# Patient Record
Sex: Male | Born: 1935 | Race: White | Hispanic: No | Marital: Married | Smoking: Never smoker
Health system: Southern US, Community
[De-identification: ages and names within clinical notes are randomized; demographics above are authoritative.]

## PROBLEM LIST (undated history)

## (undated) DIAGNOSIS — C679 Malignant neoplasm of bladder, unspecified: Secondary | ICD-10-CM

## (undated) DIAGNOSIS — D500 Iron deficiency anemia secondary to blood loss (chronic): Secondary | ICD-10-CM

## (undated) DIAGNOSIS — K219 Gastro-esophageal reflux disease without esophagitis: Secondary | ICD-10-CM

## (undated) DIAGNOSIS — M1990 Unspecified osteoarthritis, unspecified site: Secondary | ICD-10-CM

## (undated) DIAGNOSIS — I10 Essential (primary) hypertension: Secondary | ICD-10-CM

## (undated) DIAGNOSIS — I4891 Unspecified atrial fibrillation: Secondary | ICD-10-CM

## (undated) DIAGNOSIS — I2510 Atherosclerotic heart disease of native coronary artery without angina pectoris: Secondary | ICD-10-CM

## (undated) DIAGNOSIS — I219 Acute myocardial infarction, unspecified: Secondary | ICD-10-CM

## (undated) DIAGNOSIS — Z87442 Personal history of urinary calculi: Secondary | ICD-10-CM

## (undated) DIAGNOSIS — E785 Hyperlipidemia, unspecified: Secondary | ICD-10-CM

## (undated) DIAGNOSIS — C61 Malignant neoplasm of prostate: Secondary | ICD-10-CM

## (undated) DIAGNOSIS — R972 Elevated prostate specific antigen [PSA]: Secondary | ICD-10-CM

## (undated) DIAGNOSIS — D369 Benign neoplasm, unspecified site: Secondary | ICD-10-CM

## (undated) DIAGNOSIS — K5790 Diverticulosis of intestine, part unspecified, without perforation or abscess without bleeding: Secondary | ICD-10-CM

## (undated) DIAGNOSIS — R350 Frequency of micturition: Secondary | ICD-10-CM

## (undated) HISTORY — PX: CYSTOSCOPY W/ STONE MANIPULATION: SHX1427

## (undated) HISTORY — DX: Essential (primary) hypertension: I10

## (undated) HISTORY — PX: CARDIAC CATHETERIZATION: SHX172

## (undated) HISTORY — DX: Atherosclerotic heart disease of native coronary artery without angina pectoris: I25.10

## (undated) HISTORY — DX: Diverticulosis of intestine, part unspecified, without perforation or abscess without bleeding: K57.90

## (undated) HISTORY — DX: Hyperlipidemia, unspecified: E78.5

## (undated) HISTORY — DX: Benign neoplasm, unspecified site: D36.9

## (undated) HISTORY — DX: Elevated prostate specific antigen (PSA): R97.20

## (undated) HISTORY — DX: Iron deficiency anemia secondary to blood loss (chronic): D50.0

---

## 1989-02-13 DIAGNOSIS — I219 Acute myocardial infarction, unspecified: Secondary | ICD-10-CM

## 1989-02-13 HISTORY — DX: Acute myocardial infarction, unspecified: I21.9

## 1991-08-14 HISTORY — PX: CORONARY ARTERY BYPASS GRAFT: SHX141

## 1997-10-08 ENCOUNTER — Ambulatory Visit (HOSPITAL_COMMUNITY): Admission: RE | Admit: 1997-10-08 | Discharge: 1997-10-08 | Payer: Self-pay | Admitting: Urology

## 2001-11-14 ENCOUNTER — Encounter (INDEPENDENT_AMBULATORY_CARE_PROVIDER_SITE_OTHER): Payer: Self-pay

## 2001-11-14 ENCOUNTER — Ambulatory Visit (HOSPITAL_COMMUNITY): Admission: RE | Admit: 2001-11-14 | Discharge: 2001-11-14 | Payer: Self-pay | Admitting: *Deleted

## 2004-01-14 ENCOUNTER — Encounter: Payer: Self-pay | Admitting: Cardiology

## 2004-03-25 ENCOUNTER — Encounter (INDEPENDENT_AMBULATORY_CARE_PROVIDER_SITE_OTHER): Payer: Self-pay | Admitting: *Deleted

## 2004-03-25 ENCOUNTER — Ambulatory Visit (HOSPITAL_COMMUNITY): Admission: RE | Admit: 2004-03-25 | Discharge: 2004-03-25 | Payer: Self-pay | Admitting: *Deleted

## 2007-04-16 HISTORY — PX: ORIF ACETABULAR FRACTURE: SHX5029

## 2007-04-29 ENCOUNTER — Encounter: Payer: Self-pay | Admitting: Cardiology

## 2007-05-02 ENCOUNTER — Ambulatory Visit (HOSPITAL_COMMUNITY): Admission: RE | Admit: 2007-05-02 | Discharge: 2007-05-02 | Payer: Self-pay | Admitting: Orthopedic Surgery

## 2007-05-04 ENCOUNTER — Inpatient Hospital Stay (HOSPITAL_COMMUNITY): Admission: RE | Admit: 2007-05-04 | Discharge: 2007-05-06 | Payer: Self-pay | Admitting: Orthopedic Surgery

## 2007-07-26 ENCOUNTER — Ambulatory Visit (HOSPITAL_BASED_OUTPATIENT_CLINIC_OR_DEPARTMENT_OTHER): Admission: RE | Admit: 2007-07-26 | Discharge: 2007-07-26 | Payer: Self-pay | Admitting: Orthopedic Surgery

## 2007-08-11 ENCOUNTER — Encounter: Payer: Self-pay | Admitting: Cardiology

## 2008-02-10 ENCOUNTER — Encounter: Payer: Self-pay | Admitting: Cardiology

## 2008-02-10 HISTORY — PX: CARDIOVASCULAR STRESS TEST: SHX262

## 2008-08-09 ENCOUNTER — Encounter: Payer: Self-pay | Admitting: Cardiology

## 2009-08-25 ENCOUNTER — Encounter: Payer: Self-pay | Admitting: Cardiology

## 2009-12-02 ENCOUNTER — Encounter: Payer: Self-pay | Admitting: Cardiology

## 2010-02-25 ENCOUNTER — Ambulatory Visit: Payer: Self-pay | Admitting: Cardiology

## 2010-02-25 DIAGNOSIS — I2510 Atherosclerotic heart disease of native coronary artery without angina pectoris: Secondary | ICD-10-CM | POA: Insufficient documentation

## 2010-02-25 DIAGNOSIS — I25810 Atherosclerosis of coronary artery bypass graft(s) without angina pectoris: Secondary | ICD-10-CM | POA: Insufficient documentation

## 2010-02-25 DIAGNOSIS — I10 Essential (primary) hypertension: Secondary | ICD-10-CM | POA: Insufficient documentation

## 2010-02-25 DIAGNOSIS — E785 Hyperlipidemia, unspecified: Secondary | ICD-10-CM | POA: Insufficient documentation

## 2010-02-25 DIAGNOSIS — E7800 Pure hypercholesterolemia, unspecified: Secondary | ICD-10-CM | POA: Insufficient documentation

## 2010-07-17 NOTE — Letter (Signed)
Summary: [facility] Extended Visit Note   [facility] Extended Visit Note   Imported By: Roderic Ovens 04/18/2010 14:33:22  _____________________________________________________________________  External Attachment:    Type:   Image     Comment:   External Document

## 2010-07-17 NOTE — Progress Notes (Signed)
Summary: [facility] Office Note   [facility]   Imported By: Roderic Ovens 04/18/2010 14:33:50  _____________________________________________________________________  External Attachment:    Type:   Image     Comment:   External Document

## 2010-07-17 NOTE — Assessment & Plan Note (Signed)
Summary: np6/dx:CAD   Visit Type:  Initial Consult Primary Provider:  Renne Crigler  CC:  CAD.  History of Present Illness: Patient is referred through the courtesy of Dr. Merri Brunette for continuing cardiac care.  The patient had an MI and CABG in 11-08-91.  He has been followed for hypertension, and is a [place] [facility]  He has a large farm, and retired in the past couple of years.  He has no chest pain, shortness of breath, or change in symptoms to warrant concern at the present time.   Current Medications (verified): 1)  Prilosec Otc 20 Mg Tbec (Omeprazole Magnesium) .... Take 1 Tablet By Mouth Once A Day 2)  Saw Palmetto Plus  Caps (Misc Natural Products) .... 4 Daily 3)  Crestor 20 Mg Tabs (Rosuvastatin Calcium) .... Take 1/2  Tablet By Mouth Daily. 4)  Toprol Xl 50 Mg Xr24h-Tab (Metoprolol Succinate) .... Take 1 Tablet By Mouth Once A Day 5)  Fiber Therapy 500 Mg Tabs (Methylcellulose (Laxative)) .... 625mg  2 in Am and 2 Pm 6)  Excedrin Pm 500-38 Mg Tabs (Diphenhydramine-Apap (Sleep)) .... As Needed  Allergies (verified): No Known Drug Allergies  Past History:  Past Medical History: CABG 1991-11-08 details unknown at present. HTN under control Chronic tinnitus with decreased hearing Elevated PSA--follows with Dr. Annabell Howells Mixed hyperlipidemia on Crestor Kidney stones times three Diverticulosis Gout Adenomatous polyp Stress test Tysinger 02/10/08---6 mets, poor tolerance, no chest pain,  Cath 2005--total occlusion of LAD, LCX, RCA.  LIMA patent, normal seq SVG to OM, severe stenosis in SVG to PDA, but collaterals to PLA from OM.  RCA went to PDA.  Treated medically  Past Surgical History: Prior CABG ORIF trimalleolar fracture 2006-11-08  Family History: F died of MI age 61 Mother died of MI age 67 Sons alive and well Brother 4 with MI. Three sisters and two other brothers without cardiac  Social History: Lives in [place].  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   Walks. Does wear hearing aids.   Review of Systems       The patient complains of decreased hearing.  The patient denies anorexia, fever, weight loss, weight gain, vision loss, hoarseness, chest pain, syncope, dyspnea on exertion, peripheral edema, prolonged cough, headaches, hemoptysis, abdominal pain, melena, hematochezia, severe indigestion/heartburn, hematuria, incontinence, and muscle weakness.    Vital Signs:  Patient profile:   77 year old male Height:      72 inches Weight:      232.75 pounds BMI:     31.68 Pulse rate:   62 / minute Pulse rhythm:   regular Resp:     18 per minute BP sitting:   132 / 75  (left arm) Cuff size:   large  Vitals Entered By: Vikki Ports (February 25, 2010 12:54 PM)  Physical Exam  General:  Well developed, well nourished, in no acute distress. Head:  normocephalic and atraumatic Eyes:  PERRLA/EOM intact; conjunctiva and lids normal. Lungs:  Clear bilaterally to auscultation and percussion. Heart:  PMI non displaced.  Normal S1 and S2.  1-2/6 SEM.  No DM, rub, or gallop. Abdomen:  Bowel sounds positive; abdomen soft and non-tender without masses, organomegaly, or hernias noted. No hepatosplenomegaly. Pulses:  pulses normal in all 4 extremities Extremities:  No clubbing or cyanosis. Neurologic:  Alert and oriented x 3.   EKG  Procedure date:  02/25/2010  Findings:      NSR with RBBB.  PACs.  No acute changes.  Cardiac Cath  Procedure date:  01/14/2004  Findings:      Report generated from [facility] above.  Should be filed in chart.  Impression & Recommendations:  Problem # 1:  CAD, ARTERY BYPASS GRAFT (ICD-414.04) Patient is currently asymptomatic.  He does what he wants, and denies angina.  ETT does describe poor exercise tolerance.  Cath report in 2005 revealed evidence of SVG disease, but treated medically.  As such, no change in status.  Will repeat GXT at next follow up visit. His updated medication list  for this problem includes:    Toprol Xl 50 Mg Xr24h-tab (Metoprolol succinate) .Marland Kitchen... Take 1 tablet by mouth once a day  Problem # 2:  HYPERTENSION, BENIGN (ICD-401.1) controlled at present.  Followed by Dr. Renne Crigler. His updated medication list for this problem includes:    Toprol Xl 50 Mg Xr24h-tab (Metoprolol succinate) .Marland Kitchen... Take 1 tablet by mouth once a day  Problem # 3:  HYPERLIPIDEMIA-MIXED (ICD-272.4) followed and managed by Dr. Renne Crigler His updated medication list for this problem includes:    Crestor 20 Mg Tabs (Rosuvastatin calcium) .Marland Kitchen... Take 1/2  tablet by mouth daily.  Other Orders: EKG w/ Interpretation (93000)  Patient Instructions: 1)  Your physician wants you to follow-up in: 6 MONTHS.  You will receive a reminder letter in the mail two months in advance. If you don't receive a letter, please call our office to schedule the follow-up appointment. 2)  Your physician recommends that you continue on your current medications as directed. Please refer to the Current Medication list given to you today. 3)  Your physician has requested that you have an exercise tolerance test in 6 MONTHS.  For further information please visit [email or web address]  Please also follow instruction sheet, as given.

## 2010-07-17 NOTE — Letter (Signed)
Summary: [facility] Cath Report   [facility] Cath Report   Imported By: Roderic Ovens 04/21/2010 15:30:08  _____________________________________________________________________  External Attachment:    Type:   Image     Comment:   External Document

## 2010-07-17 NOTE — Progress Notes (Signed)
Summary: [facility] Office Note   [facility] Office Note   Imported By: Roderic Ovens 04/18/2010 14:35:14  _____________________________________________________________________  External Attachment:    Type:   Image     Comment:   External Document

## 2010-07-17 NOTE — Letter (Signed)
Summary: [facility]  [facility]   Imported By: Marylou Mccoy 04/18/2010 15:38:18  _____________________________________________________________________  External Attachment:    Type:   Image     Comment:   External Document

## 2010-07-17 NOTE — Progress Notes (Signed)
Summary: [facility] Office Note   [facility] Office Note   Imported By: Roderic Ovens 04/18/2010 14:34:23  _____________________________________________________________________  External Attachment:    Type:   Image     Comment:   External Document

## 2010-07-17 NOTE — Letter (Signed)
Summary: [facility]  [facility]   Imported By: Marylou Mccoy 04/18/2010 15:36:14  _____________________________________________________________________  External Attachment:    Type:   Image     Comment:   External Document

## 2010-08-21 ENCOUNTER — Encounter (INDEPENDENT_AMBULATORY_CARE_PROVIDER_SITE_OTHER): Payer: Medicare Other

## 2010-08-21 ENCOUNTER — Inpatient Hospital Stay (HOSPITAL_COMMUNITY)
Admission: EM | Admit: 2010-08-21 | Discharge: 2010-08-25 | DRG: 310 | Disposition: A | Discharge status: Home / Self Care | Payer: Medicare Other | Attending: Cardiology | Admitting: Cardiology

## 2010-08-21 ENCOUNTER — Emergency Department (HOSPITAL_COMMUNITY): Payer: Medicare Other

## 2010-08-21 ENCOUNTER — Encounter (INDEPENDENT_AMBULATORY_CARE_PROVIDER_SITE_OTHER): Payer: Medicare Other | Admitting: Cardiology

## 2010-08-21 ENCOUNTER — Encounter: Payer: Self-pay | Admitting: Cardiology

## 2010-08-21 DIAGNOSIS — E785 Hyperlipidemia, unspecified: Secondary | ICD-10-CM | POA: Diagnosis present

## 2010-08-21 DIAGNOSIS — R0989 Other specified symptoms and signs involving the circulatory and respiratory systems: Secondary | ICD-10-CM

## 2010-08-21 DIAGNOSIS — R972 Elevated prostate specific antigen [PSA]: Secondary | ICD-10-CM | POA: Diagnosis present

## 2010-08-21 DIAGNOSIS — I10 Essential (primary) hypertension: Secondary | ICD-10-CM | POA: Diagnosis present

## 2010-08-21 DIAGNOSIS — I4891 Unspecified atrial fibrillation: Principal | ICD-10-CM | POA: Diagnosis present

## 2010-08-21 DIAGNOSIS — I4510 Unspecified right bundle-branch block: Secondary | ICD-10-CM | POA: Diagnosis present

## 2010-08-21 DIAGNOSIS — I2510 Atherosclerotic heart disease of native coronary artery without angina pectoris: Secondary | ICD-10-CM

## 2010-08-21 DIAGNOSIS — R9430 Abnormal result of cardiovascular function study, unspecified: Secondary | ICD-10-CM

## 2010-08-21 DIAGNOSIS — I2582 Chronic total occlusion of coronary artery: Secondary | ICD-10-CM | POA: Diagnosis present

## 2010-08-21 DIAGNOSIS — I252 Old myocardial infarction: Secondary | ICD-10-CM

## 2010-08-21 DIAGNOSIS — R000 Tachycardia, unspecified: Secondary | ICD-10-CM

## 2010-08-21 DIAGNOSIS — I4892 Unspecified atrial flutter: Secondary | ICD-10-CM | POA: Diagnosis present

## 2010-08-21 DIAGNOSIS — Z79899 Other long term (current) drug therapy: Secondary | ICD-10-CM

## 2010-08-21 LAB — POCT CARDIAC MARKERS: Myoglobin, poc: 132 ng/mL (ref 12–200)

## 2010-08-21 LAB — POCT I-STAT, CHEM 8
Calcium, Ion: 1.12 mmol/L (ref 1.12–1.32)
Chloride: 108 mEq/L (ref 96–112)
Creatinine, Ser: 1.3 mg/dL (ref 0.4–1.5)
Glucose, Bld: 105 mg/dL — ABNORMAL HIGH (ref 70–99)
Potassium: 3.8 mEq/L (ref 3.5–5.1)

## 2010-08-21 LAB — BASIC METABOLIC PANEL
CO2: 22 mEq/L (ref 19–32)
Calcium: 9.4 mg/dL (ref 8.4–10.5)
Creatinine, Ser: 1.19 mg/dL (ref 0.4–1.5)
GFR calc Af Amer: >60 mL/min (ref >60)

## 2010-08-21 LAB — CBC
Hemoglobin: 16.2 g/dL (ref 13.0–17.0)
MCH: 30.8 pg (ref 26.0–34.0)
MCV: 90.5 fL (ref 78.0–100.0)
RBC: 5.26 MIL/uL (ref 4.22–5.81)
RDW: 12.9 % (ref 11.5–15.5)

## 2010-08-21 LAB — DIFFERENTIAL
Basophils Absolute: 0 10*3/uL (ref 0.0–0.1)
Eosinophils Absolute: 0.1 10*3/uL (ref 0.0–0.7)
Monocytes Relative: 11 % (ref 3–12)
Neutro Abs: 5.9 10*3/uL (ref 1.7–7.7)

## 2010-08-21 LAB — URINALYSIS, ROUTINE W REFLEX MICROSCOPIC
Hgb urine dipstick: NEGATIVE
Ketones, ur: 15 mg/dL — AB
Specific Gravity, Urine: 1.026 (ref 1.005–1.030)

## 2010-08-21 LAB — CARDIAC PANEL(CRET KIN+CKTOT+MB+TROPI)
Relative Index: INVALID (ref 0.0–2.5)
Troponin I: 0.13 ng/mL — ABNORMAL HIGH (ref 0.00–0.06)

## 2010-08-21 LAB — PHOSPHORUS: Phosphorus: 2.7 mg/dL (ref 2.3–4.6)

## 2010-08-21 LAB — HEPATIC FUNCTION PANEL
Alkaline Phosphatase: 54 U/L (ref 39–117)
Bilirubin, Direct: 0.2 mg/dL (ref 0.0–0.3)
Total Bilirubin: 0.9 mg/dL (ref 0.3–1.2)
Total Protein: 7.1 g/dL (ref 6.0–8.3)

## 2010-08-22 ENCOUNTER — Inpatient Hospital Stay (HOSPITAL_COMMUNITY): Payer: Medicare Other

## 2010-08-22 DIAGNOSIS — R000 Tachycardia, unspecified: Secondary | ICD-10-CM

## 2010-08-22 LAB — CBC
Hemoglobin: 15.6 g/dL (ref 13.0–17.0)
Platelets: 243 10*3/uL (ref 150–400)
RBC: 5.07 MIL/uL (ref 4.22–5.81)
RDW: 13.1 % (ref 11.5–15.5)
WBC: 9.7 10*3/uL (ref 4.0–10.5)

## 2010-08-22 LAB — TSH: TSH: 4.828 u[IU]/mL — ABNORMAL HIGH (ref 0.350–4.500)

## 2010-08-22 LAB — HEPARIN LEVEL (UNFRACTIONATED): Heparin Unfractionated: 0.83 IU/mL — ABNORMAL HIGH (ref 0.30–0.70)

## 2010-08-23 DIAGNOSIS — I4891 Unspecified atrial fibrillation: Secondary | ICD-10-CM

## 2010-08-23 LAB — CBC
HCT: 43.3 % (ref 39.0–52.0)
Hemoglobin: 14.2 g/dL (ref 13.0–17.0)
Hemoglobin: 14.8 g/dL (ref 13.0–17.0)
MCH: 30 pg (ref 26.0–34.0)
RBC: 4.74 MIL/uL (ref 4.22–5.81)
RBC: 4.77 MIL/uL (ref 4.22–5.81)
WBC: 8.3 10*3/uL (ref 4.0–10.5)
WBC: 8.4 10*3/uL (ref 4.0–10.5)

## 2010-08-23 LAB — PROTIME-INR
INR: 1.26 (ref 0.00–1.49)
Prothrombin Time: 16 seconds — ABNORMAL HIGH (ref 11.6–15.2)

## 2010-08-23 LAB — HEPARIN LEVEL (UNFRACTIONATED): Heparin Unfractionated: 0.33 IU/mL (ref 0.30–0.70)

## 2010-08-24 DIAGNOSIS — I10 Essential (primary) hypertension: Secondary | ICD-10-CM

## 2010-08-24 LAB — CBC
Hemoglobin: 12.1 g/dL — ABNORMAL LOW (ref 13.0–17.0)
Platelets: 125 10*3/uL — ABNORMAL LOW (ref 150–400)
RBC: 4.51 MIL/uL (ref 4.22–5.81)
WBC: 9 10*3/uL (ref 4.0–10.5)

## 2010-08-25 LAB — BASIC METABOLIC PANEL
CO2: 27 mEq/L (ref 19–32)
Calcium: 8.8 mg/dL (ref 8.4–10.5)
Chloride: 108 mEq/L (ref 96–112)
GFR calc Af Amer: >60 mL/min (ref >60)
Sodium: 141 mEq/L (ref 135–145)

## 2010-08-25 LAB — CBC
HCT: 41.8 % (ref 39.0–52.0)
MCV: 91.3 fL (ref 78.0–100.0)
RBC: 4.58 MIL/uL (ref 4.22–5.81)
WBC: 8.8 10*3/uL (ref 4.0–10.5)

## 2010-08-25 LAB — PROTIME-INR: INR: 1.74 — ABNORMAL HIGH (ref 0.00–1.49)

## 2010-08-25 LAB — MAGNESIUM: Magnesium: 1.8 mg/dL (ref 1.5–2.5)

## 2010-08-29 ENCOUNTER — Encounter (INDEPENDENT_AMBULATORY_CARE_PROVIDER_SITE_OTHER): Payer: Medicare Other | Admitting: Cardiology

## 2010-08-29 ENCOUNTER — Encounter: Payer: Self-pay | Admitting: Cardiology

## 2010-08-29 DIAGNOSIS — I2510 Atherosclerotic heart disease of native coronary artery without angina pectoris: Secondary | ICD-10-CM

## 2010-08-29 DIAGNOSIS — I4891 Unspecified atrial fibrillation: Secondary | ICD-10-CM | POA: Insufficient documentation

## 2010-09-09 ENCOUNTER — Ambulatory Visit (HOSPITAL_COMMUNITY): Payer: Medicare Other | Attending: Cardiology

## 2010-09-09 ENCOUNTER — Ambulatory Visit (INDEPENDENT_AMBULATORY_CARE_PROVIDER_SITE_OTHER): Payer: Medicare Other | Admitting: Cardiology

## 2010-09-09 ENCOUNTER — Ambulatory Visit: Payer: Medicare Other | Admitting: Cardiology

## 2010-09-09 ENCOUNTER — Other Ambulatory Visit (HOSPITAL_COMMUNITY): Payer: Self-pay | Admitting: Cardiology

## 2010-09-09 ENCOUNTER — Encounter: Payer: Self-pay | Admitting: Cardiology

## 2010-09-09 VITALS — BP 164/77 | HR 51 | Resp 18 | Ht 71.0 in | Wt 234.4 lb

## 2010-09-09 DIAGNOSIS — I4891 Unspecified atrial fibrillation: Secondary | ICD-10-CM

## 2010-09-09 DIAGNOSIS — I2510 Atherosclerotic heart disease of native coronary artery without angina pectoris: Secondary | ICD-10-CM

## 2010-09-09 DIAGNOSIS — I10 Essential (primary) hypertension: Secondary | ICD-10-CM

## 2010-09-09 DIAGNOSIS — E7800 Pure hypercholesterolemia, unspecified: Secondary | ICD-10-CM

## 2010-09-09 NOTE — Progress Notes (Signed)
HPI: Overall doing well.  He does snore according to wife.  He does not want sleep study.  We reviewed echo study in detail.  No continued issues at present.  BP reviewed.    Current Outpatient Prescriptions  Medication Sig Dispense Refill  . amiodarone (PACERONE) 200 MG tablet Take 200 mg by mouth daily.        Marland Kitchen aspirin 81 MG tablet Take 81 mg by mouth daily.        . dabigatran ([name]) 150 MG CAPS Take 150 mg by mouth every 12 (twelve) hours.        . Diphenhydramine-APAP, sleep, (EXCEDRIN PM) 38-500 MG TABS as needed.        . Methylcellulose, Laxative, 500 MG TABS Take 2 tablets by mouth 2 (two) times daily.        . metoprolol (TOPROL-XL) 50 MG 24 hr tablet Take 50 mg by mouth daily.        Marland Kitchen omeprazole (PRILOSEC OTC) 20 MG tablet Take 20 mg by mouth daily.        . rosuvastatin (CRESTOR) 20 MG tablet Take 20 mg by mouth daily.        . Saw Palmetto, Serenoa repens, (SAW PALMETTO PO) Take 4 tablets by mouth daily.          No Known Allergies  Past Medical History  Diagnosis Date  . CAD (coronary artery disease)   . HTN (hypertension)   . Tinnitus   . Elevated PSA   . HLD (hyperlipidemia)   . Kidney stone   . Diverticulosis   . Gout   . Adenomatous polyp     Past Surgical History  Procedure Date  . Coronary artery bypass graft 1993  . Orif acetabular fracture 2008    trimalleolar fracture    Family History  Problem Relation Age of Onset  . Heart attack Father   . Heart attack Mother   . Heart attack Brother     History   Social History  . Marital Status: Married    Spouse Name: N/A    Number of Children: N/A  . Years of Education: N/A   Occupational History  . retired Visual merchandiser    Social History Main Topics  . Smoking status: Never Smoker   . Smokeless tobacco: Not on file  . Alcohol Use: No  . Drug Use: Not on file  . Sexually Active: Not on file   Other Topics Concern  . Not on file   Social History Narrative  . No narrative on file     ROS: Please see the HPI.  All other systems reviewed and negative.  PHYSICAL EXAM:  BP 164/77  Pulse 51  Resp 18  Ht 5\' 11"  (1.803 m)  Wt 234 lb 6.4 oz (106.323 kg)  BMI 32.69 kg/m2  General: Well developed, well nourished, in no acute distress. Head:  Normocephalic and atraumatic. Neck: no JVD Lungs: Clear to auscultation and percussion. Heart: Normal S1 and S2.  No murmur, rubs or gallops.  Abdomen:  Normal bowel sounds; soft; non tender; no organomegaly Pulses: Pulses normal in all 4 extremities. Extremities: No clubbing or cyanosis. No edema.  Has some lower extremity petechiae in left foot.  Minor finding at present but caution noted to patient.  Neurologic: Alert and oriented x 3.  EKG:  SB. RBBB.  QTc slightly prolonged.    ASSESSMENT AND PLAN: ATRIAL FIBRILLATION Maintaining NSR.  Discussed at length with patient including echo review.  He  may have element of OSA driving findings.  He does not want to have a sleep study at this point.  Remains on low dose Amiodarone at this point in time.    HYPERTENSION, BENIGN Not at target. We discussed adding medications, but also discussed weight loss as an option, and I went through this with him in detail.    CORONARY ATHEROSCLEROSIS NATIVE CORONARY ARTERY No current symptoms at present.  Continue medical therapy  HYPERCHOLESTEROLEMIA Followed by Dr. Renne Crigler.

## 2010-09-09 NOTE — Assessment & Plan Note (Signed)
Followed by Dr Pharr 

## 2010-09-09 NOTE — Patient Instructions (Signed)
Your physician recommends that you schedule a follow-up appointment in: 1 MONTH  Your physician recommends that you continue on your current medications as directed. Please refer to the Current Medication list given to you today.  Your physician has requested that you regularly monitor and record your blood pressure readings at home. Please use the same machine at the same time of day to check your readings and record them to bring to your follow-up visit.  Your physician recommends that you return for lab work in: 1 MONTH (TSH, LIVER 427.31, 401.9, 414.01)

## 2010-09-09 NOTE — Assessment & Plan Note (Signed)
Maintaining NSR.  Discussed at length with patient including echo review.  He may have element of OSA driving findings.  He does not want to have a sleep study at this point.  Remains on low dose Amiodarone at this point in time.

## 2010-09-09 NOTE — Assessment & Plan Note (Signed)
No current symptoms at present.  Continue medical therapy

## 2010-09-09 NOTE — Assessment & Plan Note (Signed)
Not at target. We discussed adding medications, but also discussed weight loss as an option, and I went through this with him in detail.

## 2010-09-11 NOTE — Assessment & Plan Note (Signed)
Summary: [name]   Primary Provider:  Renne Crigler   History of Present Illness: Recently admitted to the hospital with atrial fib with rapid response.  Did not know he was in it.  Was placed on Amiodarone, and [name].  Seems to be doing well.  I taught him how to take his pulse, and also told him to be on the watch for change in stool color.  Doing well with this.  He, his wife and I discussed activities he should and should not do.  I also discussed with him Amiodarone in detail.  Admitted with PAF over the weekend, and now controlled.  Has not had echo yet.  Also, thyroid function was borderline, and likely needs to  be repeated with TSH just minimally elevated.    Problems Prior to Update: 1)  Atrial Fibrillation  (ICD-427.31) 2)  Hypertension, Benign  (ICD-401.1) 3)  Cad, Artery Bypass Graft  (ICD-414.04) 4)  Hyperlipidemia-mixed  (ICD-272.4) 5)  Hypercholesterolemia  (ICD-272.0) 6)  Coronary Atherosclerosis Native Coronary Artery  (ICD-414.01)  Current Medications (verified): 1)  Prilosec Otc 20 Mg Tbec (Omeprazole Magnesium) .... Take 1 Tablet By Mouth Once A Day 2)  Saw Palmetto Plus  Caps (Misc Natural Products) .... 4 Daily 3)  Crestor 20 Mg Tabs (Rosuvastatin Calcium) .... Take One Tablet Once Daily 4)  Toprol Xl 50 Mg Xr24h-Tab (Metoprolol Succinate) .... Take 1 Tablet By Mouth Once A Day 5)  Fiber Therapy 500 Mg Tabs (Methylcellulose (Laxative)) .... 625mg  2 in Am and 2 Pm 6)  Excedrin Pm 500-38 Mg Tabs (Diphenhydramine-Apap (Sleep)) .... As Needed 7)  [name] 150 Mg Caps (Dabigatran Etexilate Mesylate) .... Take One Capsule Two Times A Day 8)  Aspirin 81 Mg Tabs (Aspirin) .... One Tablet Once Daily 9)  Amiodarone Hcl 200 Mg Tabs (Amiodarone Hcl) .... Take One Tablet Once Daily  Allergies (verified): No Known Drug Allergies  Past History:  Past Medical History: Last updated: 03/26/10 CABG 1993 details unknown at present. HTN under control Chronic tinnitus with decreased  hearing Elevated PSA--follows with Dr. Annabell Howells Mixed hyperlipidemia on Crestor Kidney stones times three Diverticulosis Gout Adenomatous polyp Stress test Tysinger 02/10/08---6 mets, poor tolerance, no chest pain,  Cath 2005--total occlusion of LAD, LCX, RCA.  LIMA patent, normal seq SVG to OM, severe stenosis in SVG to PDA, but collaterals to PLA from OM.  RCA went to PDA.  Treated medically  Past Surgical History: Last updated: 03/26/2010 Prior CABG ORIF trimalleolar fracture 2008  Family History: Last updated: 2010-03-26 F died of MI age 57 Mother died of MI age 30 Sons alive and well Brother 19 with MI. Three sisters and two other brothers without cardiac  Social History: Last updated: 03/26/2010 Lives in [place].  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  Walks. Does wear hearing aids.   Vital Signs:  Patient profile:   77 year old male Height:      72 inches Weight:      232 pounds Pulse rate:   59 / minute Pulse rhythm:   regular BP sitting:   130 / 68  (left arm) Cuff size:   large  Vitals Entered By: Judithe Modest CMA (August 29, 2010 11:00 AM)  Physical Exam  General:  Well developed, well nourished, in no acute distress. Head:  normocephalic and atraumatic Eyes:  PERRLA/EOM intact; conjunctiva and lids normal. Lungs:  Clear bilaterally to auscultation and percussion. Heart:  PMi non displaced. Normal S1 and S2.  No def murmur.  Abdomen:  Bowel sounds positive; abdomen soft and non-tender without masses, organomegaly, or hernias noted. No hepatosplenomegaly. Extremities:  No clubbing or cyanosis. Neurologic:  Alert and oriented x 3.   EKG  Procedure date:  08/29/2010  Findings:      NSR.  RBBB.  Inferior MI old.  Rate controlled.  Impression & Recommendations:  Problem # 1:  ATRIAL FIBRILLATION (ICD-427.31) Now in NSR.  Evan Dorsey is 4.  Stroke risk is 4%.  On [name] and appopriate precautions given.  On Amio, and taught him hold to  control his rate.  He will return in two weeks.  We discuss Amio at length.  Will get 2D echo to assess LVEF.   His updated medication list for this problem includes:    Toprol Xl 50 Mg Xr24h-tab (Metoprolol succinate) .Marland Kitchen... Take 1 tablet by mouth once a day    Aspirin 81 Mg Tabs (Aspirin) ..... One tablet once daily    Amiodarone Hcl 200 Mg Tabs (Amiodarone hcl) .Marland Kitchen... Take one tablet once daily  Orders: Echocardiogram (Echo)  Problem # 2:  HYPERTENSION, BENIGN (ICD-401.1) controlled at present. His updated medication list for this problem includes:    Toprol Xl 50 Mg Xr24h-tab (Metoprolol succinate) .Marland Kitchen... Take 1 tablet by mouth once a day    Aspirin 81 Mg Tabs (Aspirin) ..... One tablet once daily  Orders: EKG w/ Interpretation (93000) Echocardiogram (Echo)  Problem # 3:  CAD, ARTERY BYPASS GRAFT (ICD-414.04) stable His updated medication list for this problem includes:    Toprol Xl 50 Mg Xr24h-tab (Metoprolol succinate) .Marland Kitchen... Take 1 tablet by mouth once a day    Aspirin 81 Mg Tabs (Aspirin) ..... One tablet once daily  Orders: EKG w/ Interpretation (93000) Echocardiogram (Echo)  Patient Instructions: 1)  Your physician recommends that you schedule a follow-up appointment in: 2 WEEKS 2)  Your physician recommends that you continue on your current medications as directed. Please refer to the Current Medication list given to you today. 3)  Your physician has requested that you have an echocardiogram.  Echocardiography is a painless test that uses sound waves to create images of your heart. It provides your doctor with information about the size and shape of your heart and how well your heart's chambers and valves are working.  This procedure takes approximately one hour. There are no restrictions for this procedure.

## 2010-09-25 NOTE — H&P (Signed)
NAME:  TYSHAWN, CIULLO             ACCOUNT NO.:  [id number]  MEDICAL RECORD NO.:  [id number]           PATIENT TYPE:  I  LOCATION:  2022                         FACILITY:  [facility]  PHYSICIAN:  Arturo Morton. Riley Kill, MD, FACCDATE OF BIRTH:  02/10/1933  DATE OF ADMISSION:  08/21/2010 DATE OF DISCHARGE:                             HISTORY & PHYSICAL   CHIEF COMPLAINT:  "I feel fine."  HISTORY OF PRESENT ILLNESS:  Mr. Rossbach is a very pleasant 77 year old gentleman who had been previously cared for by Dr. Aleen Campi.  He has been referred through the courtesy of Dr. Merri Brunette.  The patient had a myocardial infarction and CABG in 1993.  He has had a history of hypertension and is followed in [facility].  He has been feeling just fine.  He came up for an exercise treadmill today.  His baseline EKG does demonstrate right bundle-branch block.  However, in the treadmill room, he was noted to have a rate of 150 beats per minute with a wide complex tachycardia.  I have reviewed the tracings with Dr. Ladona Ridgel who felt this was most likely atrial fib flutter and not ventricular tachycardia.  Nonetheless, we thought it was prudent for the patient to be admitted to the hospital.  Dr. Ladona Ridgel recommended considering amiodarone as a frontline therapy.  The patient will be admitted for further evaluation.  CURRENT MEDICATIONS: 1. Prilosec over-the-counter 20 mg daily. 2. Saw palmetto Plus caps daily. 3. Crestor 20 mg 1/2 tablet daily by mouth. 4. Toprol-XL 50 mg 1 tablet by mouth once a day. 5. Fiber therapy 2 in the morning and 2 in the a.m. 6. Excedrin PM as needed.  ALLERGIES:  The patient has no known drug allergies.  PAST HISTORY: 1. Coronary artery bypass surgery in 1993, details unknown. 2. Hypertension, under control. 3. Chronic tinnitus. 4. Elevated PSA, follow up with Dr. Bjorn Pippin. 5. Mixed hyperlipidemia, on Crestor. 6. History of nephrolithiasis. 7. Diverticulosis. 8.  Gout. 9. Adenomatous polyps. 10.Cardiac cath in 2005, total occlusion of all 3 native arteries,     left internal mammary artery patent, normal sequential saphenous     vein graft to the OM, severe stenosis in the saphenous vein graft     to the PDA with collaterals from the PLA, from the OM. 11.Open reduction and internal fixation of trimalleolar fracture in     2008.  FAMILY HISTORY:  Father died of MI at age 36.  Mother died of MI at age 74.  His sons are alive and well.  His brother is 58 with an MI.  Three sisters and two other brothers without cardiac problems.  SOCIAL HISTORY:  The patient lives in [place].  He is a retired Visual merchandiser.  He does not smoke or drink.  He does wear hearing aids.  REVIEW OF SYSTEMS:  He is entirely asymptomatic.  He does have decreased hearing.  He denies fever, weight loss, tachycardia, or other major symptoms.  Complete 12 review of systems are negative.  PHYSICAL EXAMINATION:  GENERAL:  He is alert and oriented. VITAL SIGNS:  Heart rate is 160.  The blood  pressure is 130/70.  The respirations are 15 and unlabored. LUNG:  Lung fields are clear. CARDIAC:  Rhythm is irregularly irregular. EXTREMITIES:  Without edema.  Electrocardiogram is basically a rhythm strip that demonstrates wide complex tachycardia with intermittent sinus beats and occasional PVCs. This is reviewed.  The patient's baseline rate is relatively slow which may complicate the therapy, although he is on beta-blockade.  IMPRESSION: 1. Wide complex tachycardia, likely due to atrial flutter with     underlying right bundle morphology. 2. Coronary artery disease, status post coronary artery bypass graft     surgery. 3. Hypertension. 4. Mixed hyperlipidemia.  PLAN: 1. Admit the patient to the hospital. 2. We will taper him off his beta-blockers and subsequently place the     patient on amiodarone with the hope of controlling him with sinus     rhythm. 3. Coumadin  anticoagulation will be recommended. 4. Two-dimensional echocardiogram will be checked.     Arturo Morton. Riley Kill, MD, [facility]     TDS/MEDQ  D:  08/21/2010  T:  08/22/2010  Job:  [id number]  Electronically Signed by Shawnie Pons MD [facility] on 09/25/2010 05:37:57 AM

## 2010-09-25 NOTE — Discharge Summary (Signed)
NAME:  Evan Dorsey, Evan Dorsey             ACCOUNT NO.:  [id number]  MEDICAL RECORD NO.:  [id number]           PATIENT TYPE:  I  LOCATION:  2022                         FACILITY:  [facility]  PHYSICIAN:  Arturo Morton. Riley Kill, MD, FACCDATE OF BIRTH:  [birth year]  DATE OF ADMISSION:  08/21/2010 DATE OF DISCHARGE:  08/25/2010                              DISCHARGE SUMMARY   PRIMARY CARDIOLOGIST:  Maisie Fus D. Riley Kill, MD, [facility].  DISCHARGE DIAGNOSIS:  Atrial fibrillation with rapid ventricular response.  SECONDARY DIAGNOSES: 1. Coronary artery disease status post coronary artery bypass grafting     in 1993. 2. Hypertension. 3. Hyperlipidemia. 4. Chronic tinnitus. 5. History of elevated PSA, followed by Urology. 6. History of nephrolithiasis. 7. Diverticulosis. 8. Gout. 9. Adenomatous polyps. 10.Status post trimalleolar fracture in 2008 with open reduction and     internal fixation. 11.Chronic right bundle-branch block with prolonged QT.  ALLERGIES:  No known drug allergies.  PROCEDURES:  None.  HISTORY OF PRESENT ILLNESS:  A 77 year old male with the above problem list.  The patient was seen in the office on August 21, 2010 after undergoing a treadmill test during which he developed a wide complex tachycardia with a rate of 150.  This was evaluated by Dr. Riley Kill and Dr. Ladona Ridgel and felt to be atrial fibrillation with rapid ventricular response in the setting of a known right bundle-branch block.  Decision was made to admit the patient for further evaluation and antiarrhythmic initiation.  HOSPITAL COURSE:  Following admission, the patient was noted to have mild elevation in his cardiac markers with a peak CK-MB of 5.6 and troponin-I 0.13.  Of note, the patient had no chest pain and dyspnea and enzyme elevation was felt to be secondary to demand ischemia in the second of rapid atrial fibrillation.  The patient was placed on amiodarone therapy initially of 400 mg b.i.d. which was  subsequent reduced to 400 mg daily on March 11 secondary to QTC of 550 milliseconds in the setting of right bundle branch block.  With a CHADS2 score of 2, it was felt that the patient would benefit from anticoagulation.  The patient however did not want to be placed on Coumadin therapy as he lives quite a distance from his local provider and obtaining INRs would be difficult for him.  Therefore, we opted for [name] therapy at 150 mg q.12 h.  The patient has tolerated this well.  The patient has maintained sinus rhythm to the remainder of his hospitalization.  He has been placed back on beta-blocker therapy.  We plan to discharge him home today in good condition.  We will reduce his amiodarone to 200 mg daily starting tomorrow and he is to follow up with Dr. Riley Kill at the end of the week.  DISCHARGE LABS:  Hemoglobin 13.6, hematocrit 41.8, WBC 28, platelets 220, INR 1.74.  Sodium 141, potassium 3.6, chloride 108, CO2 27, BUN 7, creatinine 0.97, glucose 94, total bilirubin 0.9, alkaline phosphatase 54, AST 35, ALT 24, total protein 7.1, albumin 4.1, calcium 8.8, magnesium 1.8, CK 89, MB 5.6, troponin-I 0.10.  TSH 4.828.  Pulmonary function testing showed an FVC of 3.33 (82%),  FEV-1 of 2.58 (89%), FEV- 1/FVC of 78%, FEF 25-75 is 2.16 (103%), DLCO of 29.33 (94%), in all this suggested minimal obstructive airway disease, peripheral airway.  DISPOSITION:  The patient is being discharged home today in good condition.  FOLLOWUP PLANS AND APPOINTMENTS:  The patient will follow up with Dr. Shawnie Pons on Friday, August 29, 2010 at 10:15 a.m.  Follow up with Dr. Renne Crigler as previously scheduled.  DISCHARGE MEDICATIONS: 1. Amiodarone 200 mg daily. 2. Aspirin 81 mg daily. 3. Dabigatran 150 mg q.12 h. 4. Crestor 20 mg nightly. 5. Fiber therapy over-the-counter 625 mg 2 tablets b.i.d. 6. Multivitamin 1 tablet daily. 7. Prilosec 20 mg daily. 8. Toprol-XL 50 mg nightly. 9. Saw Palmetto 450  mg 2 tablets b.i.d.  OUTSTANDING LAB STUDIES:  None.  DURATION OF DISCHARGE ENCOUNTER:  40 minutes including physician time.     Nicolasa Ducking, ANP   ______________________________ Arturo Morton. Riley Kill, MD, [facility]    CB/MEDQ  D:  08/25/2010  T:  08/26/2010  Job:  [id number]  cc:   Soyla Murphy. Renne Crigler, M.D.  Electronically Signed by Nicolasa Ducking ANP on 09/01/2010 12:04:16 PM Electronically Signed by Shawnie Pons MD [facility] on 09/25/2010 05:37:59 AM

## 2010-10-01 ENCOUNTER — Ambulatory Visit: Payer: Medicare Other | Admitting: Cardiology

## 2010-10-16 ENCOUNTER — Ambulatory Visit (INDEPENDENT_AMBULATORY_CARE_PROVIDER_SITE_OTHER): Payer: Medicare Other | Admitting: Cardiology

## 2010-10-16 ENCOUNTER — Encounter: Payer: Self-pay | Admitting: Cardiology

## 2010-10-16 VITALS — BP 128/80 | HR 49 | Resp 18 | Ht 72.0 in | Wt 229.1 lb

## 2010-10-16 DIAGNOSIS — I4891 Unspecified atrial fibrillation: Secondary | ICD-10-CM

## 2010-10-16 DIAGNOSIS — I25810 Atherosclerosis of coronary artery bypass graft(s) without angina pectoris: Secondary | ICD-10-CM

## 2010-10-16 DIAGNOSIS — E785 Hyperlipidemia, unspecified: Secondary | ICD-10-CM

## 2010-10-16 LAB — HEPATIC FUNCTION PANEL
ALT: 20 U/L (ref 0–53)
AST: 23 U/L (ref 0–37)
Bilirubin, Direct: 0 mg/dL (ref 0.0–0.3)
Total Bilirubin: 0.6 mg/dL (ref 0.3–1.2)

## 2010-10-16 LAB — TSH: TSH: 2.5 u[IU]/mL (ref 0.35–5.50)

## 2010-10-16 NOTE — Assessment & Plan Note (Signed)
Maintaining NSR on Amio.  Labs today.  Close follow up.  Specific instructions regarding HR given.  If below 45, then he will cut dose.

## 2010-10-16 NOTE — Patient Instructions (Signed)
Your physician recommends that you schedule a follow-up appointment in: 1 MONTH  Your physician recommends that you continue on your current medications as directed. Please refer to the Current Medication list given to you today.  Your physician recommends that you have lab work today: LIVER, TSH  Please check you pulse over the next month and bring readings into your next appointment.

## 2010-10-16 NOTE — Assessment & Plan Note (Signed)
No chest pain.  Doing well from this standpoint.  No stress test done as he was in af.

## 2010-10-16 NOTE — Progress Notes (Signed)
HPI:  Doing very well.  Not having any problem.  Brought in a log of BP and P today.  Lowest is high 40s.  No syncope or presyncope.    Current Outpatient Prescriptions  Medication Sig Dispense Refill  . amiodarone (PACERONE) 200 MG tablet Take 200 mg by mouth daily.        Marland Kitchen aspirin 81 MG tablet Take 81 mg by mouth daily.        . dabigatran ([name]) 150 MG CAPS Take 150 mg by mouth every 12 (twelve) hours.        . Diphenhydramine-APAP, sleep, (EXCEDRIN PM) 38-500 MG TABS as needed.        . Methylcellulose, Laxative, 500 MG TABS Take 2 tablets by mouth 2 (two) times daily.        . metoprolol (TOPROL-XL) 50 MG 24 hr tablet Take 50 mg by mouth daily.        Marland Kitchen omeprazole (PRILOSEC OTC) 20 MG tablet Take 20 mg by mouth daily.        . rosuvastatin (CRESTOR) 20 MG tablet Take 20 mg by mouth daily.        . Saw Palmetto, Serenoa repens, (SAW PALMETTO PO) Take 4 tablets by mouth daily.          No Known Allergies  Past Medical History  Diagnosis Date  . CAD (coronary artery disease)   . HTN (hypertension)   . Tinnitus   . Elevated PSA   . HLD (hyperlipidemia)   . Kidney stone   . Diverticulosis   . Gout   . Adenomatous polyp     Past Surgical History  Procedure Date  . Coronary artery bypass graft 1993  . Orif acetabular fracture 2008    trimalleolar fracture    Family History  Problem Relation Age of Onset  . Heart attack Father   . Heart attack Mother   . Heart attack Brother     History   Social History  . Marital Status: Married    Spouse Name: N/A    Number of Children: N/A  . Years of Education: N/A   Occupational History  . retired Visual merchandiser    Social History Main Topics  . Smoking status: Never Smoker   . Smokeless tobacco: Not on file  . Alcohol Use: No  . Drug Use: Not on file  . Sexually Active: Not on file   Other Topics Concern  . Not on file   Social History Narrative  . No narrative on file    ROS: Please see the HPI.  All other systems  reviewed and negative.  PHYSICAL EXAM:  BP 128/80  Pulse 49  Resp 18  Ht 6' (1.829 m)  Wt 229 lb 1.9 oz (103.928 kg)  BMI 31.07 kg/m2  General: Well developed, well nourished, in no acute distress. Head:  Normocephalic and atraumatic. Neck: no JVD Lungs: Clear to auscultation and percussion. Heart: Normal S1 and S2.  No murmur, rubs or gallops.  Abdomen:  Normal bowel sounds; soft; non tender; no organomegaly Pulses: Pulses normal in all 4 extremities. Extremities: No clubbing or cyanosis. No edema. Neurologic: Alert and oriented x 3.  EKG:  Marked SB.   RBBB.  ASSESSMENT AND PLAN:

## 2010-10-16 NOTE — Assessment & Plan Note (Signed)
Followed by Dr Pharr 

## 2010-10-27 ENCOUNTER — Encounter: Payer: Self-pay | Admitting: Cardiology

## 2010-10-28 NOTE — Op Note (Signed)
NAME:  Evan Dorsey, Evan Dorsey             ACCOUNT NO.:  [id number]   MEDICAL RECORD NO.:  [id number]          PATIENT TYPE:  OIB   LOCATION:  1534                         FACILITY:  [facility]   PHYSICIAN:  Georges Lynch. Gioffre, M.D.DATE OF BIRTH:  22-Jul-1932   DATE OF PROCEDURE:  05/04/2007  DATE OF DISCHARGE:                               OPERATIVE REPORT   SURGEON:  Georges Lynch. Darrelyn Hillock, M.D.   ASSISTANT:  Jamelle Rushing, P.A.   PREOPERATIVE DIAGNOSIS:  A 4-1/2-week-old displaced trimalleolar  fracture dislocation left ankle.   HISTORY:  He fractured his trimalleolar fracture dislocation of his left  ankle in [place].  He was splinted, came back to [place] for his  treatment.  I saw him about a week post injury in the office.  His ankle  was quite swollen.  There were blisters on the ankle.  There was an  abrasion site and blisters directly over the operative site in the  medial malleolus.  I told him his skin was in no condition for any  immediate surgery, so I put him in a posterior splint and watched him  very carefully.  I had him go on aspirin, had him come back in the  office for follow-up a few days later, checked the skin once again here  two days before surgery and it looked much better, so we did decided to  proceed with the surgery today.   OPERATION:  Open reduction internal fixation of a trimalleolar fracture  left ankle.   POSTOPERATIVE DIAGNOSIS:  Open reduction internal fixation of a  trimalleolar fracture left ankle.   PROCEDURE:  Under general anesthesia, first I did a routine prep and  scrub of his left lower extremity.  Once this was done, we then did a  sterile prep with the DuraPrep.  At this time, the leg was exsanguinated  with Esmarch tourniquet, it was elevated at 350 mmHg.  An incision was  first made over the medial malleolar site.  He had a fractured medial  malleolus with displacement.  I curetted out the fracture site, cleaned  it out, irrigated out to  make sure there were no loose fragments in the  joint.  Also I made sure that the posterior tibial tendon was free and  that there was nothing impinging on the joint because his ankle mortise  was grossly displaced.  Once I cleaned this up, I packed the area and  then opened up the ankle laterally directly over the fibular site with  great care taken not to injure any underlying neurovascular structures.  I identified the fibular fracture site.  He had an oblique fracture  posteriorly.  We reduced that and we applied a seven hole plate in the  usual fashion.  Each screw that was put in was measured carefully and  also observed under C-arm control.  We utilized five screws.  Basically,  the distal screws were cancellus, the proximal screws were cortical and  the middle two screws we used were cancellus screws.  They were all 4 mm  diameter screws.  The various length of screws were  measured  appropriately according to the depth gauge and observation under C-arm.  We utilized two syndesmosis screws.  We brought the mortise together  quite nicely at this time and we reduced the syndesmosis separation.  After this was done, we checked once again for screw length.  Everything  looked fine.  We thoroughly irrigated out the area and closed the  lateral malleolar wound.  We did bend the distal distal part of the  plate to contour it with the distal fibula.  Following that, we went  back in the medial malleolar fracture site, examined that once again to  make sure there was no soft tissue entrapment.  There was none.  We  thoroughly irrigated the joint out and made sure the joint was free.  We  reduced the fracture, held it in place with a towel clip.  Guide pin was  inserted across the fracture site.  Following that, we observed with C-  arm to have a good anatomical reduction.  We examined it on the AP and  mortise views.  We then made the appropriate screw length measurements.  We selected a 44  mm screw because if we had any longer screw, it would  abut it against one of our syndesmosis screws.  Following that, we then  utilized a washer and a partially threaded cancellus screw 44 mm in  diameter and compressed the fracture site.  We had a solid fixation.  We  then repaired the deltoid ligament by utilizing some 0 Vicryl sutures.  The wound was thoroughly irrigated and closed in usual fashion.  We then  applied a nice well-padded Neosporin dressing reinforced with two ABDs  and then put a nice layer of cast padding under the cast and then  loosely applied a short leg cast.  Note, the tourniquet was let down  before the cast was applied.  The patient had 1 gram of IV Ancef preop.   Post reduction x-rays were taken.           ______________________________  Georges Lynch Darrelyn Hillock, M.D.     RAG/MEDQ  D:  05/04/2007  T:  05/04/2007  Job:  [id number]   cc:   Soyla Murphy. Renne Crigler, M.D.  Fax: [phone]   Antionette Char, MD  Fax: [phone]

## 2010-10-28 NOTE — Op Note (Signed)
NAME:  JOHNROBERT, FOTI             ACCOUNT NO.:  [id number]   MEDICAL RECORD NO.:  [id number]          PATIENT TYPE:  AMB   LOCATION:  NESC                         FACILITY:  [facility]   PHYSICIAN:  Georges Lynch. Gioffre, M.D.DATE OF BIRTH:  1933-01-12   DATE OF PROCEDURE:  07/26/2007  DATE OF DISCHARGE:                               OPERATIVE REPORT   SURGEON:  Georges Lynch. Darrelyn Hillock, M.D.   ASSISTANT:  Nurse.   PREOPERATIVE DIAGNOSIS:  Healed trimalleolar ankle fracture on the left,  with two syndesmosis screws.   POSTOPERATIVE DIAGNOSIS:  Healed trimalleolar ankle fracture on the  left, with two syndesmosis screws.   OPERATION:  Removal of two syndesmosis screws, left ankle.   PROCEDURE:  Under general anesthesia, an initial prep and then a sterile  prep of the left lower extremity was carried out.  He had 1 gram of IV  Ancef.  At this time an incision was made over the lateral aspect of the  fibula.  No tourniquet was necessary.  Following that I went down and  identified the fibular plate, brought the mini C-arm in and identified  both of the syndesmosis screws; removed those with the small fragments  set screw driver.  I thoroughly irrigated out the area and closed the  wound in layers in usual fashion.  The skin was closed with 3-0 nylon  suture, and a sterile Neosporin dressing was applied.  The patient left  the operating room in satisfactory condition.   FOLLOW-UP CARE:  1. Will be on aspirin one a day as an anticoagulant.  2. Will be on crutches full weightbearing.  3. Will be seen in the office Monday for a follow-up, and an AP and      lateral oblique x-ray of his left ankle.  4. Be on Percocet 10/50 one every 4 hours p.r.n. for pain.           ______________________________  Georges Lynch. Darrelyn Hillock, M.D.     RAG/MEDQ  D:  07/26/2007  T:  07/27/2007  Job:  [id number]

## 2010-10-31 NOTE — Procedures (Signed)
[facility]  Patient:    Evan Dorsey, Evan Dorsey Visit Number: [id number] MRN: [id number]          Service Type: END Location: ENDO Attending Physician:  Sabino Gasser Dictated by:   Sabino Gasser, M.D. Proc. Date: 11/14/01 Admit Date:  11/14/2001                             Procedure Report  PROCEDURE:  Upper endoscopy.  INDICATION FOR PROCEDURE:  Dysphagia.  ANESTHESIA:  Demerol 50, Versed 5 mg.  DESCRIPTION OF PROCEDURE:  With the patient mildly sedated in the left lateral decubitus position, the Olympus videoscopic endoscope was inserted in the mouth and passed under direct vision through the esophagus which appeared normal until we reached the distal esophagus and there appeared to be a stricture which was photographed only. The patient stated that he no longer was having dysphagia since he was on a proton pump inhibitor so we elected not to dilate at this time. We entered into the stomach through a hiatal hernia. The fundus, body, antrum, duodenal bulb and second portion of the duodenum were all well visualized. From this point, the endoscope was slowly withdrawn taking circumferential views of the entire duodenal mucosa until the endoscope was then pulled back into the stomach, placed in retroflexion to view the stomach from below. Once again the hiatal hernia was visualized, the endoscope was straightened and withdrawn. The patients vital signs and pulse oximeter remained stable. The patient tolerated the procedure well without apparent complications.  FINDINGS:  Question of a stricture above a hiatal hernia, otherwise, unremarkable examination.  PLAN:  Proceed to colonoscopy. Dictated by:   Sabino Gasser, M.D. Attending Physician:  Sabino Gasser DD:  11/14/01 TD:  11/15/01 Job: [id number]

## 2010-10-31 NOTE — Op Note (Signed)
NAME:  Evan Dorsey, Evan Dorsey NO.:  [id number]   MEDICAL RECORD NO.:  [id number]          PATIENT TYPE:  AMB   LOCATION:  ENDO                         FACILITY:  [facility]   PHYSICIAN:  Georgiana Spinner, M.D.    DATE OF BIRTH:  07/15/1932   DATE OF PROCEDURE:  03/25/2004  DATE OF DISCHARGE:                                 OPERATIVE REPORT   PROCEDURE:  Colonoscopy with biopsy.   INDICATIONS:  Colon polyp.   ANESTHESIA:  Demerol 80 mg, Versed 7.5 mg.   PROCEDURE:  With the patient mildly sedated in the left lateral decubitus  position, a rectal examination was performed which was unremarkable.  Subsequently the Olympus videoscopic colonoscope was inserted in the rectum  and passed under direct vision to the cecum, identified by ileocecal valve  and appendiceal orifice, both of which were photographed.  From this point  the colonoscope was slowly withdrawn taking circumferential view of the  colonic mucosa, stopping in the descending colon, where a small polyp was  seen, photographed, and removed using hot biopsy forceps technique, setting  of 20/200 blended current.  The endoscope was withdrawn all the way to the  rectum, which appeared normal on direct and showed hemorrhoids on  retroflexed view.  The scope was straightened and withdrawn.  The patient's  vital signs and pulse oximetry remained stable.  The patient tolerated the  procedure well without apparent complication.   FINDINGS:  A small  polyp of the descending colon, otherwise an unremarkable  exam other than internal hemorrhoids.   PLAN:  Await biopsy report.  The patient will call me for results and follow  up with me as an outpatient.       GMO/MEDQ  D:  03/25/2004  T:  03/25/2004  Job:  [id number]

## 2010-10-31 NOTE — Procedures (Signed)
[facility]  Patient:    Evan Dorsey, Evan Dorsey Visit Number: [id number] MRN: [id number]          Service Type: END Location: ENDO Attending Physician:  Sabino Gasser Dictated by:   Sabino Gasser, M.D. Proc. Date: 11/14/01 Admit Date:  11/14/2001                             Procedure Report  PROCEDURE:  Colonoscopy.  INDICATION FOR PROCEDURE:  Colon polyp, colon cancer screening.  ANESTHESIA:  Demerol 30, Versed 3 mg.  DESCRIPTION OF PROCEDURE:  With the patient mildly sedated in the left lateral decubitus position, the Olympus videoscopic colonoscope was inserted in the rectum and passed under direct vision after a normal rectal exam to the cecum identified by the ileocecal valve and appendiceal orifice. From this point, the colonoscope was slowly withdrawn taking circumferential views of the entire colonic mucosa, pulling back all the way to the rectum stopping only first in the descending colon where a polyp was seen and photographed and removed using hot biopsy forceps technique on a setting of 20/20 blended current and the sigmoid were rare diverticulum were seen and photographed. The endoscope was then placed in retroflexion the rectum to view the anal canal from above, internal hemorrhoids were seen and photographed.  The patients vital signs and pulse oximeter remained stable. The patient tolerated the procedure well without apparent complications.  FINDINGS:  Small internal hemorrhoids. Diverticulosis, mild of the sigmoid colon and polyp of descending colon.  PLAN:  Await biopsy report. The patient will call me for results and followup with me in as an outpatient.Dictated by:   Sabino Gasser, M.D. Attending Physician:  Sabino Gasser DD:  11/14/01 TD:  11/15/01 Job: [id number]

## 2010-10-31 NOTE — Discharge Summary (Signed)
NAME:  Evan Dorsey, Evan Dorsey             ACCOUNT NO.:  [id number]   MEDICAL RECORD NO.:  [id number]          PATIENT TYPE:  INP   LOCATION:  1534                         FACILITY:  [facility]   PHYSICIAN:  Georges Lynch. Gioffre, M.D.DATE OF BIRTH:  04/01/1933   DATE OF ADMISSION:  05/04/2007  DATE OF DISCHARGE:  05/06/2007                               DISCHARGE SUMMARY   ADMISSION DIAGNOSES:  1. Left ankle trimalleolar fracture with dislocation.  2. History of coronary artery bypass graft.  3. History of hypercholesterolemia.  4. History of coronary artery disease.   DISCHARGE DIAGNOSES:  1. Left ankle open reduction and internal fixation with repair of      deltoid ligament.  2. History of coronary artery bypass graft.  3. History of coronary artery disease.  4. History of hypercholesterolemia.   HISTORY OF PRESENT ILLNESS:  The patient is a 77 year old gentleman who  presents to Dr. Darrelyn Hillock in his office on April 28, 2007.  He states  that he had a trimalleolar fracture and dislocation of his left ankle  while down in [place] 1 week previous.  He had it reduced and placed in  a splint.  The patient elected to proceed home to seek medical care here  in [place].  The patient presents to the office with this  trimalleolar fracture in a splint.  After evaluation of the patient, Dr.  Darrelyn Hillock elected to proceed with ORIF of his left ankle.   MEDICATIONS:  1. Crestor 20 mg a day.  2. Saw palmetto once a day.  3. Fiber therapy.  4. Multivitamins.  5. Prilosec 20 mg a day.  6. Percocet 10/650 one-half tablet every 8 hours.  7. Endocet 5 mg once every 8 hours p.r.n.   SURGICAL PROCEDURES:  On May 04, 2007, the patient was taken to the  OR by Dr. Worthy Rancher assisted by Oneida Alar, PA-C.  Under general  anesthesia, the patient underwent ORIF of his left trimalleolar  fracture.  He also had a deltoid ligament repair.  The patient tolerated  the procedure well.  There were no  complications.  The patient was  placed in a postoperative cast, transferred to the recovery room and  then to the orthopedic floor in good condition.   CONSULTANT:  The following routine consults were requested:  1. Physical therapy.  2. Occupational therapy.  3. Case management.  4. Pharmacy.   HOSPITAL COURSE:  On May 04, 2007, the patient was admitted to  [facility] under the care of Dr. Worthy Rancher.  The patient was  taken to the OR where an ORIF of his left trimalleolar fracture was  performed without any complications.  The patient was then placed in a  cast postoperatively.  He was transferred to the recovery room and then  to the  orthopedic floor in good condition on IV pain medicines and  antibiotics.  The patient over the next 2 days was able to be weaned off  IV medications to p.o. meds well without any problems.  His left lower  extremity remained neurovascularly intact.  Toes without any swelling or  signs of infection around the cast.  Cast was comfortable.  The patient  was able to do minimal ambulation with the assistance of a walker with  physical therapy.  He was placed on Coumadin for DVT prophylaxis.  On  postop day number 2, he was felt to be orthopedically medically ready  for discharge home.  So he was discharged in good condition with  followup in 1 week.   DIET:  No restrictions.   ACTIVITY:  The patient is to be nonweightbearing on his left lower  extremity.  He is to keep the cast clean and dry.  Check for circulation  issues on a daily basis.   FOLLOW UP:  1. The patient needs a follow up appointment with Dr. Darrelyn Hillock in his      office the following Tuesday.  The patient is to call [phone] for      this appointment.  2. Home health physical therapy, PT and INR monitoring with [facility].   DISCHARGE MEDICATIONS:  1. Percocet 10/50 one tablet every 4-6 hours for pain if needed.  2. Coumadin 5 mg once a day unless  changed by home health pharmacist      to change his medications.  3. Crestor 20 mg a day.  4. Saw palmetto is to go on hold.  5. Fiber therapy once a day.  6. Multivitamin once a day.  7. Prilosec 20 mg once a day.   DISPOSITION:  Home.   CONDITION ON DISCHARGE:  Improved and good.      Jamelle Rushing, P.A.    ______________________________  Georges Lynch Darrelyn Hillock, M.D.    RWK/MEDQ  D:  05/18/2007  T:  05/18/2007  Job:  [id number]   cc:   Windy Fast A. Darrelyn Hillock, M.D.  Fax: [phone]

## 2010-11-13 ENCOUNTER — Encounter: Payer: Self-pay | Admitting: Cardiology

## 2010-11-13 ENCOUNTER — Ambulatory Visit (INDEPENDENT_AMBULATORY_CARE_PROVIDER_SITE_OTHER): Payer: Medicare Other | Admitting: Cardiology

## 2010-11-13 DIAGNOSIS — I10 Essential (primary) hypertension: Secondary | ICD-10-CM

## 2010-11-13 DIAGNOSIS — I4891 Unspecified atrial fibrillation: Secondary | ICD-10-CM

## 2010-11-13 DIAGNOSIS — I2510 Atherosclerotic heart disease of native coronary artery without angina pectoris: Secondary | ICD-10-CM

## 2010-11-13 NOTE — Patient Instructions (Signed)
Your physician recommends that you schedule a follow-up appointment in: 2 months with Dr Riley Kill  Your physician has recommended that you have a pulmonary function test. Pulmonary Function Tests are a group of tests that measure how well air moves in and out of your lungs.

## 2010-11-13 NOTE — Progress Notes (Signed)
HPI:  Doing great.  Medications reviewed in detail.  No complaints. BP and P reviewed.  He is doing this three times daily.  No chest pain.  No sensation of out orf rhythm.  Thinks he is doing well.  Denies chest pain.  [name] issues reviewed in detail.  Encouraged to be vigilant about trauma.   Current Outpatient Prescriptions  Medication Sig Dispense Refill  . amiodarone (PACERONE) 200 MG tablet Take 200 mg by mouth daily.        Marland Kitchen aspirin 81 MG tablet Take 81 mg by mouth daily.        . dabigatran ([name]) 150 MG CAPS Take 150 mg by mouth every 12 (twelve) hours.        . Methylcellulose, Laxative, 500 MG TABS Take 2 tablets by mouth 2 (two) times daily.        . metoprolol (TOPROL-XL) 50 MG 24 hr tablet Take 50 mg by mouth daily.        Marland Kitchen omeprazole (PRILOSEC OTC) 20 MG tablet Take 20 mg by mouth daily.        . rosuvastatin (CRESTOR) 20 MG tablet Take 20 mg by mouth daily.        . Saw Palmetto, Serenoa repens, (SAW PALMETTO PO) Take 4 tablets by mouth daily.        Marland Kitchen DISCONTD: Diphenhydramine-APAP, sleep, (EXCEDRIN PM) 38-500 MG TABS as needed.          No Known Allergies  Past Medical History  Diagnosis Date  . CAD (coronary artery disease)   . HTN (hypertension)   . Tinnitus   . Elevated PSA   . HLD (hyperlipidemia)   . Kidney stone   . Diverticulosis   . Gout   . Adenomatous polyp     Past Surgical History  Procedure Date  . Coronary artery bypass graft 1993  . Orif acetabular fracture 2008    trimalleolar fracture    Family History  Problem Relation Age of Onset  . Heart attack Father   . Heart attack Mother   . Heart attack Brother     History   Social History  . Marital Status: Married    Spouse Name: N/A    Number of Children: N/A  . Years of Education: N/A   Occupational History  . retired Visual merchandiser    Social History Main Topics  . Smoking status: Never Smoker   . Smokeless tobacco: Not on file  . Alcohol Use: No  . Drug Use: Not on file  .  Sexually Active: Not on file   Other Topics Concern  . Not on file   Social History Narrative  . No narrative on file    ROS: Please see the HPI.  All other systems reviewed and negative.  PHYSICAL EXAM:  BP 120/70  Pulse 50  Resp 18  Ht 5\' 11"  (1.803 m)  Wt 225 lb (102.059 kg)  BMI 31.38 kg/m2  General: Well developed, well nourished, in no acute distress. Head:  Normocephalic and atraumatic. Neck: no JVD Lungs: Clear to auscultation and percussion. Heart: Normal S1 and S2.  No murmur, rubs or gallops.  Abdomen:  Normal bowel sounds; soft; non tender; no organomegaly Pulses: Pulses normal in all 4 extremities. Extremities: No clubbing or cyanosis. No edema. Neurologic: Alert and oriented x 3.  EKG:  SB.  RBBB.  Cannot exclude old IMI.  No acute changes.  QTc is .   ASSESSMENT AND PLAN:

## 2010-11-13 NOTE — Assessment & Plan Note (Signed)
No obvious recurrence.  Continue meds.  Get PFTs without bronchodilators, and also get DLCO as baseline.

## 2010-11-13 NOTE — Assessment & Plan Note (Signed)
Stable at present.   

## 2010-11-13 NOTE — Assessment & Plan Note (Signed)
Excellent.  Reivewed.

## 2011-01-07 ENCOUNTER — Encounter: Payer: Self-pay | Admitting: Cardiology

## 2011-01-12 ENCOUNTER — Ambulatory Visit (INDEPENDENT_AMBULATORY_CARE_PROVIDER_SITE_OTHER): Payer: Medicare Other | Admitting: Cardiology

## 2011-01-12 ENCOUNTER — Encounter: Payer: Self-pay | Admitting: Cardiology

## 2011-01-12 ENCOUNTER — Ambulatory Visit (INDEPENDENT_AMBULATORY_CARE_PROVIDER_SITE_OTHER): Payer: Medicare Other | Admitting: Internal Medicine

## 2011-01-12 VITALS — BP 119/72 | HR 57 | Resp 18 | Ht 71.0 in | Wt 225.1 lb

## 2011-01-12 DIAGNOSIS — I2510 Atherosclerotic heart disease of native coronary artery without angina pectoris: Secondary | ICD-10-CM

## 2011-01-12 DIAGNOSIS — I4891 Unspecified atrial fibrillation: Secondary | ICD-10-CM

## 2011-01-12 DIAGNOSIS — I25810 Atherosclerosis of coronary artery bypass graft(s) without angina pectoris: Secondary | ICD-10-CM

## 2011-01-12 DIAGNOSIS — E7800 Pure hypercholesterolemia, unspecified: Secondary | ICD-10-CM

## 2011-01-12 DIAGNOSIS — I10 Essential (primary) hypertension: Secondary | ICD-10-CM

## 2011-01-12 LAB — PULMONARY FUNCTION TEST

## 2011-01-12 NOTE — Patient Instructions (Signed)
Your physician recommends that you schedule a follow-up appointment in: 2 months with Dr. Stuckey   

## 2011-01-12 NOTE — Progress Notes (Signed)
PFT done today. 

## 2011-01-24 NOTE — Assessment & Plan Note (Signed)
See information in overview.  No current symptoms.  Has SVG stenosis treated medically, and stable. Continue medical therapy.

## 2011-01-24 NOTE — Assessment & Plan Note (Signed)
On medical therapy 

## 2011-01-24 NOTE — Progress Notes (Signed)
HPI:  Mr. Evan Dorsey is here for follow up of his arrythmia.  He also brought in his daily pulse and BP recordings.  The pulse runs in the mid 50s to high 40s.  The BPs have largely been in the target range.  He seems to tolerate his medication at the present time.  He denies any bleeding issues.    Current Outpatient Prescriptions  Medication Sig Dispense Refill  . amiodarone (PACERONE) 200 MG tablet Take 200 mg by mouth daily.        Marland Kitchen aspirin 81 MG tablet Take 81 mg by mouth daily.        . dabigatran ([name]) 150 MG CAPS Take 150 mg by mouth every 12 (twelve) hours.        . Methylcellulose, Laxative, 500 MG TABS Take 2 tablets by mouth 2 (two) times daily.        . metoprolol (TOPROL-XL) 50 MG 24 hr tablet Take 50 mg by mouth daily.        Marland Kitchen omeprazole (PRILOSEC OTC) 20 MG tablet Take 20 mg by mouth daily.        . rosuvastatin (CRESTOR) 20 MG tablet Take 20 mg by mouth daily.        . Saw Palmetto, Serenoa repens, (SAW PALMETTO PO) Take 4 tablets by mouth daily.          No Known Allergies  Past Medical History  Diagnosis Date  . CAD (coronary artery disease)   . HTN (hypertension)   . Tinnitus   . Elevated PSA   . HLD (hyperlipidemia)   . Kidney stone   . Diverticulosis   . Gout   . Adenomatous polyp     Past Surgical History  Procedure Date  . Coronary artery bypass graft 1993  . Orif acetabular fracture 2008    trimalleolar fracture  . Cardiac catheterization     total occlusion to LAD, LCX, RCA. LIMA patent, normal seq SVG to OM, severe stenosis in SVG to PDA, but collaterals to PLA from OM. RCA went to PDA. treated medically   . Cardiovascular stress test 02/10/08    6 mets, poor tolerance, no chest pain     Family History  Problem Relation Age of Onset  . Heart attack Father   . Heart attack Mother   . Heart attack Brother     History   Social History  . Marital Status: Married    Spouse Name: N/A    Number of Children: N/A  . Years of Education: N/A    Occupational History  . retired Visual merchandiser    Social History Main Topics  . Smoking status: Never Smoker   . Smokeless tobacco: Not on file  . Alcohol Use: No  . Drug Use: Not on file  . Sexually Active: Not on file   Other Topics Concern  . Not on file   Social History Narrative  . No narrative on file    ROS: Please see the HPI.  All other systems reviewed and negative.  PHYSICAL EXAM:  BP 119/72  Pulse 57  Resp 18  Ht 5\' 11"  (1.803 m)  Wt 225 lb 1.9 oz (102.114 kg)  BMI 31.40 kg/m2  General: Well developed, well nourished, in no acute distress. Head:  Normocephalic and atraumatic. Neck: no JVD Lungs: Clear to auscultation and percussion. Heart: Normal S1 and S2.  No murmur, rubs or gallops.  Abdomen:  Normal bowel sounds; soft; non tender; no organomegaly Pulses: Pulses normal  in all 4 extremities. Extremities: No clubbing or cyanosis. No edema. Neurologic: Alert and oriented x 3.  EKG:  SB. RBBB.  Cannot exclude old IMI.    ASSESSMENT AND PLAN:

## 2011-01-24 NOTE — Assessment & Plan Note (Addendum)
Currently on amiodarone and pradaxa.  Tolerates well.  No stroke symptoms.   Will continue medical therapy.

## 2011-01-24 NOTE — Assessment & Plan Note (Signed)
Controlled at present.  

## 2011-03-09 ENCOUNTER — Encounter: Payer: Self-pay | Admitting: Cardiology

## 2011-03-09 ENCOUNTER — Ambulatory Visit (INDEPENDENT_AMBULATORY_CARE_PROVIDER_SITE_OTHER): Payer: Medicare Other | Admitting: Cardiology

## 2011-03-09 DIAGNOSIS — I4891 Unspecified atrial fibrillation: Secondary | ICD-10-CM

## 2011-03-09 DIAGNOSIS — I10 Essential (primary) hypertension: Secondary | ICD-10-CM

## 2011-03-09 DIAGNOSIS — I2510 Atherosclerotic heart disease of native coronary artery without angina pectoris: Secondary | ICD-10-CM

## 2011-03-09 DIAGNOSIS — E7800 Pure hypercholesterolemia, unspecified: Secondary | ICD-10-CM

## 2011-03-09 MED ORDER — METOPROLOL SUCCINATE ER 25 MG PO TB24: 25.0000 mg | ORAL_TABLET | Freq: Every day | ORAL | Status: DC

## 2011-03-09 MED ORDER — AMLODIPINE BESYLATE 5 MG PO TABS: 5.0000 mg | ORAL_TABLET | Freq: Every day | ORAL | Status: DC

## 2011-03-09 NOTE — Progress Notes (Signed)
HPI:  He is doing well.  He does sleep a lot and his wife thinks he has some fatigue.  Otherwise getting along pretty well.    Current Outpatient Prescriptions  Medication Sig Dispense Refill  . acetaminophen (TYLENOL) 500 MG tablet Take 500 mg by mouth every 6 (six) hours as needed.        Marland Kitchen amiodarone (PACERONE) 200 MG tablet Take 200 mg by mouth daily.        Marland Kitchen aspirin 81 MG tablet Take 81 mg by mouth daily.        . dabigatran ([name]) 150 MG CAPS Take 150 mg by mouth every 12 (twelve) hours.        . Methylcellulose, Laxative, 500 MG TABS Take 2 tablets by mouth 2 (two) times daily.        . metoprolol (TOPROL-XL) 50 MG 24 hr tablet Take 50 mg by mouth daily.        Marland Kitchen omeprazole (PRILOSEC OTC) 20 MG tablet Take 20 mg by mouth daily.        . rosuvastatin (CRESTOR) 20 MG tablet Take 20 mg by mouth daily.        . Saw Palmetto, Serenoa repens, (SAW PALMETTO PO) Take 4 tablets by mouth daily.          No Known Allergies  Past Medical History  Diagnosis Date  . CAD (coronary artery disease)   . HTN (hypertension)   . Tinnitus   . Elevated PSA   . HLD (hyperlipidemia)   . Kidney stone   . Diverticulosis   . Gout   . Adenomatous polyp     Past Surgical History  Procedure Date  . Coronary artery bypass graft 1993  . Orif acetabular fracture 2008    trimalleolar fracture  . Cardiac catheterization     total occlusion to LAD, LCX, RCA. LIMA patent, normal seq SVG to OM, severe stenosis in SVG to PDA, but collaterals to PLA from OM. RCA went to PDA. treated medically   . Cardiovascular stress test 02/10/08    6 mets, poor tolerance, no chest pain     Family History  Problem Relation Age of Onset  . Heart attack Father   . Heart attack Mother   . Heart attack Brother     History   Social History  . Marital Status: Married    Spouse Name: N/A    Number of Children: N/A  . Years of Education: N/A   Occupational History  . retired Visual merchandiser    Social History Main Topics   . Smoking status: Never Smoker   . Smokeless tobacco: Not on file  . Alcohol Use: No  . Drug Use: Not on file  . Sexually Active: Not on file   Other Topics Concern  . Not on file   Social History Narrative  . No narrative on file    ROS: Please see the HPI.  All other systems reviewed and negative.  PHYSICAL EXAM:  BP 148/82  Pulse 52  Ht 6' (1.829 m)  Wt 227 lb (102.967 kg)  BMI 30.79 kg/m2  General: Well developed, well nourished, in no acute distress. Head:  Normocephalic and atraumatic. Neck: no JVD Lungs: Clear to auscultation and percussion. Heart: Normal S1 and S2.  No murmur, rubs or gallops.  Abdomen:  Normal bowel sounds; soft; non tender; no organomegaly Pulses: Pulses normal in all 4 extremities. Extremities: No clubbing or cyanosis. No edema. Neurologic: Alert and oriented x 3.  EKG: SB with sinus arrhythmia  ASSESSMENT AND PLAN:

## 2011-03-09 NOTE — Assessment & Plan Note (Signed)
Currently on crestor.  Tolerating.

## 2011-03-09 NOTE — Assessment & Plan Note (Addendum)
Numbers reviewed.  Fairly good control.  With fatigue, reduce metoprolol and add amlodipine.

## 2011-03-09 NOTE — Assessment & Plan Note (Signed)
Maintaining NSR.  Major complaint is that of fatigue.  Will reduce metoprolol for now to see if that helps.  Could roll over to amlodipine.

## 2011-03-09 NOTE — Patient Instructions (Signed)
Your physician has recommended you make the following change in your medication: START Amlodipine 5mg  take one by mouth daily, DECREASE Metoprolol Succinate to 25mg  once a day  Your physician recommends that you schedule a follow-up appointment in: 6 WEEKS

## 2011-03-09 NOTE — Assessment & Plan Note (Signed)
No chest pain

## 2011-03-12 ENCOUNTER — Other Ambulatory Visit: Payer: Self-pay | Admitting: *Deleted

## 2011-03-12 MED ORDER — DABIGATRAN ETEXILATE MESYLATE 150 MG PO CAPS: 150.0000 mg | ORAL_CAPSULE | Freq: Two times a day (BID) | ORAL | Status: DC

## 2011-03-18 ENCOUNTER — Other Ambulatory Visit: Payer: Self-pay

## 2011-03-18 MED ORDER — AMIODARONE HCL 200 MG PO TABS: 200.0000 mg | ORAL_TABLET | Freq: Every day | ORAL | Status: DC

## 2011-03-24 LAB — CBC
MCHC: 34.8
MCV: 90.8
RBC: 4.53

## 2011-03-24 LAB — COMPREHENSIVE METABOLIC PANEL
AST: 23
BUN: 9
CO2: 27
Calcium: 9.3
Creatinine, Ser: 0.86
GFR calc Af Amer: >60
GFR calc non Af Amer: >60
Glucose, Bld: 92

## 2011-03-24 LAB — DIFFERENTIAL
Lymphocytes Relative: 21
Lymphs Abs: 1.9
Neutro Abs: 5.7
Neutrophils Relative %: 66

## 2011-03-24 LAB — PROTIME-INR
INR: 1.1
Prothrombin Time: 13.9

## 2011-04-22 ENCOUNTER — Ambulatory Visit (INDEPENDENT_AMBULATORY_CARE_PROVIDER_SITE_OTHER): Payer: Medicare Other | Admitting: Cardiology

## 2011-04-22 ENCOUNTER — Encounter: Payer: Self-pay | Admitting: Cardiology

## 2011-04-22 DIAGNOSIS — I10 Essential (primary) hypertension: Secondary | ICD-10-CM

## 2011-04-22 DIAGNOSIS — E7800 Pure hypercholesterolemia, unspecified: Secondary | ICD-10-CM

## 2011-04-22 DIAGNOSIS — I4891 Unspecified atrial fibrillation: Secondary | ICD-10-CM

## 2011-04-22 DIAGNOSIS — R319 Hematuria, unspecified: Secondary | ICD-10-CM | POA: Insufficient documentation

## 2011-04-22 NOTE — Progress Notes (Signed)
HPI: He is doing well.  No specific complaints.  Did have some hematuria for one day, then it resolved.  Did not call.  Has history of kidney stones.  Feels fine, and no new symptoms.  No other bleeding associated with [name].  Feels good.   Current Outpatient Prescriptions  Medication Sig Dispense Refill  . acetaminophen (TYLENOL) 500 MG tablet Take 500 mg by mouth every 6 (six) hours as needed.        Marland Kitchen amiodarone (PACERONE) 200 MG tablet Take 1 tablet (200 mg total) by mouth daily.  30 tablet  6  . amLODipine (NORVASC) 5 MG tablet Take 1 tablet (5 mg total) by mouth daily.  90 tablet  3  . aspirin 81 MG tablet Take 81 mg by mouth daily.        . dabigatran ([name]) 150 MG CAPS Take 1 capsule (150 mg total) by mouth every 12 (twelve) hours.  60 capsule  3  . Methylcellulose, Laxative, 500 MG TABS Take 2 tablets by mouth 2 (two) times daily.        . metoprolol (TOPROL-XL) 25 MG 24 hr tablet Take 1 tablet (25 mg total) by mouth daily.  90 tablet  3  . omeprazole (PRILOSEC OTC) 20 MG tablet Take 20 mg by mouth daily.        . rosuvastatin (CRESTOR) 20 MG tablet Take 20 mg by mouth daily.        . Saw Palmetto, Serenoa repens, (SAW PALMETTO PO) Take 4 tablets by mouth daily.          No Known Allergies  Past Medical History  Diagnosis Date  . CAD (coronary artery disease)   . HTN (hypertension)   . Tinnitus   . Elevated PSA   . HLD (hyperlipidemia)   . Kidney stone   . Diverticulosis   . Gout   . Adenomatous polyp     Past Surgical History  Procedure Date  . Coronary artery bypass graft 1993  . Orif acetabular fracture 2008    trimalleolar fracture  . Cardiac catheterization     total occlusion to LAD, LCX, RCA. LIMA patent, normal seq SVG to OM, severe stenosis in SVG to PDA, but collaterals to PLA from OM. RCA went to PDA. treated medically   . Cardiovascular stress test 02/10/08    6 mets, poor tolerance, no chest pain     Family History  Problem Relation Age of Onset    . Heart attack Father   . Heart attack Mother   . Heart attack Brother     History   Social History  . Marital Status: Married    Spouse Name: N/A    Number of Children: N/A  . Years of Education: N/A   Occupational History  . retired Visual merchandiser    Social History Main Topics  . Smoking status: Never Smoker   . Smokeless tobacco: Not on file  . Alcohol Use: No  . Drug Use: Not on file  . Sexually Active: Not on file   Other Topics Concern  . Not on file   Social History Narrative  . No narrative on file    ROS: Please see the HPI.  All other systems reviewed and negative.  PHYSICAL EXAM:  BP 156/74  Pulse 57  Ht 5\' 11"  (1.803 m)  Wt 228 lb 12.8 oz (103.783 kg)  BMI 31.91 kg/m2  General: Well developed, well nourished, in no acute distress. Head:  Normocephalic and atraumatic.  Neck: no JVD Lungs: Clear to auscultation and percussion. Heart: Normal S1 and S2.  No murmur, rubs or gallops. Second sound widely split.   Abdomen:  Normal bowel sounds; soft; non tender; no organomegaly Pulses: Pulses normal in all 4 extremities. Extremities: No clubbing or cyanosis. No edema. Neurologic: Alert and oriented x 3.  EKG:  SB.  Atrial premature complexes.  RBBB.  Inferior MI, old.  ASSESSMENT AND PLAN:

## 2011-04-22 NOTE — Assessment & Plan Note (Signed)
Was very brief, and not seen since.  Will recheck his urinalysis at this point, and refer to Dr. Vernie Ammons, who he has seen in the past for kidney stones.

## 2011-04-22 NOTE — Assessment & Plan Note (Addendum)
Maintaining NSR at the present time.  On Amiodarone.  QTc 506 msec.  Will check hepatic profile, and also recheck TSH at this point.

## 2011-04-22 NOTE — Patient Instructions (Signed)
You have been referred to dr Vernie Ammons for blood in urine   Your physician recommends that you have urinalysis today also a tsh liver lab work  Your physician recommends that you schedule a follow-up appointment in: 3 months  Your physician recommends that you continue on your current medications as directed. Please refer to the Current Medication list given to you today.

## 2011-04-23 LAB — HEPATIC FUNCTION PANEL
ALT: 27 U/L (ref 0–53)
AST: 29 U/L (ref 0–37)
Alkaline Phosphatase: 51 U/L (ref 39–117)
Bilirubin, Direct: 0.1 mg/dL (ref 0.0–0.3)
Total Protein: 7.1 g/dL (ref 6.0–8.3)

## 2011-04-23 LAB — URINALYSIS, ROUTINE W REFLEX MICROSCOPIC
Bilirubin Urine: NEGATIVE
Total Protein, Urine: NEGATIVE
Urine Glucose: NEGATIVE
pH: 5.5 (ref 5.0–8.0)

## 2011-04-23 NOTE — Assessment & Plan Note (Signed)
Controlled, but we will need to watch over time.

## 2011-04-23 NOTE — Assessment & Plan Note (Signed)
Followed by his primary care MD.   

## 2011-06-17 ENCOUNTER — Telehealth: Payer: Self-pay | Admitting: Cardiology

## 2011-06-17 DIAGNOSIS — N200 Calculus of kidney: Secondary | ICD-10-CM | POA: Diagnosis not present

## 2011-06-17 DIAGNOSIS — C672 Malignant neoplasm of lateral wall of bladder: Secondary | ICD-10-CM | POA: Diagnosis not present

## 2011-06-17 DIAGNOSIS — R3129 Other microscopic hematuria: Secondary | ICD-10-CM | POA: Diagnosis not present

## 2011-06-17 DIAGNOSIS — N401 Enlarged prostate with lower urinary tract symptoms: Secondary | ICD-10-CM | POA: Diagnosis not present

## 2011-06-17 NOTE — Telephone Encounter (Signed)
New problem:  Per pam  Pt need to come off praxada, asa for bladder tumor.

## 2011-06-17 NOTE — Telephone Encounter (Signed)
New Problem:    Patient started dabigatran ([name]) 150 MG CAPS and begun having swelling below his knees in both of his legs, and they are red and itchy.  Has also been sleeping a lot, every time he sits he falls asleep.  Please advise.

## 2011-06-17 NOTE — Telephone Encounter (Signed)
Spoke with wife, he has had red, swollen, itching skin from knee down bilaterally off and on for 1 month but has now worsened for 2 weeks. Wondering if it could be a reaction from pradaxa? 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 Informed wife that rash and itching in such a specific area are unusual but Dr Riley Kill will be consulted. Pt to continue pradaxa at this time,  wife was agreeable and was informed that Dr Louretta Shorten nurse will contact them in regards to stop/ start of [name] for surgery. No date has been set for procedure yet.   1) advise how long pt can be off pradaxa. 2) advise if SX are possibly due to pradaxa and if change in med is recommended.

## 2011-06-17 NOTE — Telephone Encounter (Signed)
Will forward for dr Riley Kill review

## 2011-06-24 NOTE — Telephone Encounter (Signed)
Dr Riley Kill called and spoke with this pt on 06/23/11.

## 2011-07-06 NOTE — Telephone Encounter (Signed)
Follow up cardiac clearance   Per  Pam status of cardiac clearance

## 2011-07-06 NOTE — Telephone Encounter (Signed)
Will forward to Lauren RN to check status of clearance since Dr Riley Kill spoke with patient on 06/23/11

## 2011-07-08 NOTE — Telephone Encounter (Signed)
Patient should be able to hold his pradaxa and also his ASA in order to have his urethral oriented procedure for bladder tumor removal. He should be stable for this procedure  Shawnie Pons, MD 5:52 PM 07/08/2011

## 2011-07-08 NOTE — Telephone Encounter (Signed)
Note faxed.

## 2011-07-08 NOTE — Telephone Encounter (Signed)
I spoke with Pam and this pt needs a TURBT with Dr Vernie Ammons due to a bladder tumor.  The surgery is not scheduled at this time.  The pt will need to hold pradaxa for 7 days and ASA for 5 days prior to surgery.  I will review this information with Dr Riley Kill. (fax [phone] ATTNElita Quick)

## 2011-07-09 ENCOUNTER — Other Ambulatory Visit: Payer: Self-pay | Admitting: Urology

## 2011-07-16 ENCOUNTER — Encounter (HOSPITAL_COMMUNITY): Payer: Self-pay | Admitting: Pharmacy Technician

## 2011-07-22 ENCOUNTER — Encounter (HOSPITAL_COMMUNITY)
Admission: RE | Admit: 2011-07-22 | Discharge: 2011-07-22 | Disposition: A | Discharge status: Home / Self Care | Payer: Medicare Other | Source: Ambulatory Visit | Attending: Urology | Admitting: Urology

## 2011-07-22 ENCOUNTER — Encounter: Payer: Self-pay | Admitting: Cardiology

## 2011-07-22 ENCOUNTER — Ambulatory Visit (INDEPENDENT_AMBULATORY_CARE_PROVIDER_SITE_OTHER): Payer: Medicare Other | Admitting: Cardiology

## 2011-07-22 ENCOUNTER — Encounter (HOSPITAL_COMMUNITY): Payer: Self-pay

## 2011-07-22 VITALS — BP 136/78 | HR 54 | Ht 72.0 in | Wt 226.0 lb

## 2011-07-22 DIAGNOSIS — I4891 Unspecified atrial fibrillation: Secondary | ICD-10-CM

## 2011-07-22 DIAGNOSIS — R972 Elevated prostate specific antigen [PSA]: Secondary | ICD-10-CM | POA: Diagnosis not present

## 2011-07-22 DIAGNOSIS — Z01812 Encounter for preprocedural laboratory examination: Secondary | ICD-10-CM | POA: Diagnosis not present

## 2011-07-22 DIAGNOSIS — N401 Enlarged prostate with lower urinary tract symptoms: Secondary | ICD-10-CM | POA: Diagnosis not present

## 2011-07-22 DIAGNOSIS — I2510 Atherosclerotic heart disease of native coronary artery without angina pectoris: Secondary | ICD-10-CM

## 2011-07-22 DIAGNOSIS — N281 Cyst of kidney, acquired: Secondary | ICD-10-CM | POA: Diagnosis not present

## 2011-07-22 DIAGNOSIS — I252 Old myocardial infarction: Secondary | ICD-10-CM | POA: Diagnosis not present

## 2011-07-22 DIAGNOSIS — R3129 Other microscopic hematuria: Secondary | ICD-10-CM | POA: Diagnosis not present

## 2011-07-22 DIAGNOSIS — E7800 Pure hypercholesterolemia, unspecified: Secondary | ICD-10-CM | POA: Diagnosis not present

## 2011-07-22 DIAGNOSIS — C675 Malignant neoplasm of bladder neck: Secondary | ICD-10-CM | POA: Diagnosis not present

## 2011-07-22 DIAGNOSIS — Z79899 Other long term (current) drug therapy: Secondary | ICD-10-CM | POA: Diagnosis not present

## 2011-07-22 DIAGNOSIS — I10 Essential (primary) hypertension: Secondary | ICD-10-CM | POA: Diagnosis not present

## 2011-07-22 DIAGNOSIS — M109 Gout, unspecified: Secondary | ICD-10-CM | POA: Diagnosis not present

## 2011-07-22 HISTORY — DX: Acute myocardial infarction, unspecified: I21.9

## 2011-07-22 LAB — BASIC METABOLIC PANEL
BUN: 13 mg/dL (ref 6–23)
CO2: 25 mEq/L (ref 19–32)
Calcium: 10 mg/dL (ref 8.4–10.5)
Chloride: 104 mEq/L (ref 96–112)
Creatinine, Ser: 1 mg/dL (ref 0.50–1.35)
GFR calc Af Amer: 82 mL/min — ABNORMAL LOW (ref >90)
GFR calc non Af Amer: 71 mL/min — ABNORMAL LOW (ref >90)
Glucose, Bld: 94 mg/dL (ref 70–99)
Potassium: 3.8 mEq/L (ref 3.5–5.1)
Sodium: 139 mEq/L (ref 135–145)

## 2011-07-22 LAB — CBC
HCT: 45.8 % (ref 39.0–52.0)
Hemoglobin: 15.1 g/dL (ref 13.0–17.0)
MCH: 31.1 pg (ref 26.0–34.0)
MCHC: 33 g/dL (ref 30.0–36.0)
MCV: 94.4 fL (ref 78.0–100.0)
Platelets: 251 10*3/uL (ref 150–400)
RBC: 4.85 MIL/uL (ref 4.22–5.81)
RDW: 12.8 % (ref 11.5–15.5)
WBC: 8 10*3/uL (ref 4.0–10.5)

## 2011-07-22 LAB — SURGICAL PCR SCREEN
MRSA, PCR: NEGATIVE
Staphylococcus aureus: NEGATIVE

## 2011-07-22 LAB — APTT: aPTT: 54 seconds — ABNORMAL HIGH (ref 24–37)

## 2011-07-22 LAB — PROTIME-INR
INR: 1.33 (ref 0.00–1.49)
Prothrombin Time: 16.7 seconds — ABNORMAL HIGH (ref 11.6–15.2)

## 2011-07-22 NOTE — Pre-Procedure Instructions (Signed)
States saw Dr Riley Kill this AM and will be stopping beta blocker in two days

## 2011-07-22 NOTE — Patient Instructions (Addendum)
20 Evan Dorsey  07/22/2011   Your procedure is scheduled on:  07/31/11   Surgery [id number]     Friday  Report to [facility] at   0745    AM.  Call this number if you have problems the morning of surgery: [phone]     Or PST   [id number]  [name]   Remember:             HAS INSTRUCTIONS REGARDING [name] AND  Aspirin  per office  Do not eat food:After Midnight. Thursday NIGHT  May have clear liquids: until  Thursday NIGHT  midnight  Clear liquids include soda, tea, black coffee, apple or grape juice, broth.  Take these medicines the morning of surgery with A SIP OF WATER: NORVASC, PROLISEC  WITH SIP WATER   Do not wear jewelry, make-up or nail polish.  Do not wear lotions, powders, or perfumes. You may wear deodorant.  Do not shave 48 hours prior to surgery.  Do not bring valuables to the hospital.  Contacts, dentures or bridgework may not be worn into surgery.  Leave suitcase in the car. After surgery it may be brought to your room.  For patients admitted to the hospital, checkout time is 11:00 AM the day of discharge.   Patients discharged the day of surgery will not be allowed to drive home.  Name and phone number of your driver:   wife                                                                   Special Instructions: CHG Shower Use Special Wash: 1/2 bottle night before surgery and 1/2 bottle morning of surgery. REGULAR SOAP FACE AND PRIVATES              LADIES- NO SHAVING 48 HOURS BEFORE USING BETASEPT SOAP.                 MEN-MAY SHAVE FACE MORNING OF SURGERY  Please read over the following fact sheets that you were given: MRSA Information

## 2011-07-22 NOTE — Patient Instructions (Addendum)
Please take one-half tablet of Metoprolol Succinate for 2 days and then stop the medication.   Your physician recommends that you schedule a follow-up appointment in: 6 WEEKS  Your physician has requested that you regularly monitor and record your blood pressure readings at home. Please use the same machine at the same time of day to check your readings and record them to bring to your follow-up visit.

## 2011-07-23 ENCOUNTER — Encounter (HOSPITAL_COMMUNITY): Payer: Self-pay

## 2011-07-23 NOTE — Pre-Procedure Instructions (Signed)
Pt stated at interview 07/22/11 he saw Dr Riley Kill in the office this AM and also had EKG done- not available in [facility] at this time. LOV 11/12 with ekg in computer

## 2011-07-24 ENCOUNTER — Other Ambulatory Visit (HOSPITAL_COMMUNITY): Payer: Medicare Other

## 2011-07-24 NOTE — Progress Notes (Signed)
HPI:  He is doing ok.  Walks about thirty minutes every day.  Gets absolutely no chest pain with this.  He [place] a path [place] the [place].  No major issues at this point in time.  He needs to have a resection of a bladder tumor, and needs to come off of his pradaxa for this.  He is stable, not having recurrent arryhtmia, and he should be ok with this.  He is maintaining NSR with Amio.    Current Outpatient Prescriptions  Medication Sig Dispense Refill  . acetaminophen (TYLENOL) 500 MG tablet Take 500 mg by mouth every 6 (six) hours as needed. For pain      . amiodarone (PACERONE) 200 MG tablet Take 200 mg by mouth at bedtime.      Marland Kitchen amLODipine (NORVASC) 5 MG tablet Take 5 mg by mouth daily before breakfast.      . aspirin (ECOTRIN LOW STRENGTH) 81 MG EC tablet Take 81 mg by mouth daily. Swallow whole.      . calcium carbonate (TUMS - DOSED IN MG ELEMENTAL CALCIUM) 500 MG chewable tablet Chew 2 tablets by mouth daily.      . dabigatran ([name]) 150 MG CAPS Take 1 capsule (150 mg total) by mouth every 12 (twelve) hours.  60 capsule  3  . Methylcellulose, Laxative, 500 MG TABS Take 2 tablets by mouth 2 (two) times daily.       . metoprolol succinate (TOPROL-XL) 25 MG 24 hr tablet Take one-half tablet on Thursday and Friday and then discontinue medication on 07/25/11.      Marland Kitchen omeprazole (PRILOSEC OTC) 20 MG tablet Take 20 mg by mouth daily.       . rosuvastatin (CRESTOR) 20 MG tablet Take 20 mg by mouth at bedtime.       . Saw Palmetto, Serenoa repens, (SAW PALMETTO PO) Take 2 tablets by mouth 2 (two) times daily.         No Known Allergies  Past Medical History  Diagnosis Date  . Tinnitus   . Elevated PSA   . HLD (hyperlipidemia)   . Kidney stone   . Diverticulosis   . Gout   . Adenomatous polyp   . Myocardial infarction     silent- 20 yrs ago  . Shortness of breath     pre bypass  . HTN (hypertension)   . Dysrhythmia     atrial fibrilliation/ pradaxa. Per office aware of stopping   [name]  7 days and aspirin 5 days, note on chart  . CAD (coronary artery disease)     LOV DR STUCKY  with EKG 11/12,  note regarding anticoagulants per Dr Riley Kill on chart,  last stress test 2009, eccho and chest x ray 3/12 [facility], PFT 7/12 [facility],    Past Surgical History  Procedure Date  . Coronary artery bypass graft 1993  . Orif acetabular fracture 2008    trimalleolar fracture  . Cardiovascular stress test 02/10/08    6 mets, poor tolerance, no chest pain   . Colonoscopy   . Cardiac catheterization     total occlusion to LAD, LCX, RCA. LIMA patent, normal seq SVG to OM, severe stenosis in SVG to PDA, but collaterals to PLA from OM. RCA went to PDA. treated medically     Family History  Problem Relation Age of Onset  . Heart attack Father   . Heart attack Mother   . Heart attack Brother     History  Social History  . Marital Status: Married    Spouse Name: N/A    Number of Children: N/A  . Years of Education: N/A   Occupational History  . retired Visual merchandiser    Social History Main Topics  . Smoking status: Never Smoker   . Smokeless tobacco: Never Used  . Alcohol Use: No  . Drug Use: No  . Sexually Active: Not on file   Other Topics Concern  . Not on file   Social History Narrative  . No narrative on file    ROS: Please see the HPI.  All other systems reviewed and negative.  PHYSICAL EXAM:  BP 136/78  Pulse 54  Ht 6' (1.829 m)  Wt 226 lb (102.513 kg)  BMI 30.65 kg/m2  General: Well developed, well nourished, in no acute distress. Head:  Normocephalic and atraumatic. Neck: no JVD Lungs: Clear to auscultation and percussion. Heart: Normal S1 and S2.  S4 gallop.   Abdomen:  Normal bowel sounds; soft; non tender; no organomegaly Pulses: Pulses normal in all 4 extremities. Extremities: No clubbing or cyanosis. No edema. Neurologic: Alert and oriented x 3.  EKG:  SB.  RBBB.  Inferior MI, old.  ECHO :  Results reviewed again in clinic.  EF 55-60,  biatrial  enlargement. Mild pulm HTN.  No acute abnormalities.     ASSESSMENT AND PLAN:

## 2011-07-28 ENCOUNTER — Encounter (HOSPITAL_COMMUNITY): Payer: Self-pay | Admitting: *Deleted

## 2011-07-30 NOTE — H&P (Signed)
History of Present Illness         Ureterolithiasis: He had a right distal ureteral stone treated with ureteroscopy and extraction in 11-Nov-1997. He has passed stones spontaneously as well.  BPH with outlet obstruction: This is been present for number of years.  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  Elevated PSA: He was noted to have a PSA of 6.38 in 5/12.   Interval history:   Past Medical History Problems  1. History of  Acute Myocardial Infarction V12.59 2. History of  Atrial Fibrillation 427.31 3. History of  Cholelithiasis 574.20 4. History of  Distal Ureteral Stone On The Right Right 592.1 5. History of  Gout 274.9 6. History of  Hypercholesterolemia 272.0  Surgical History Problems  1. History of  Ankle Surgery Left 2. History of  Cystoscopy With Ureteroscopy With Removal Of Calculus Right 3. History of  Heart Surgery  Current Meds 1. Acetaminophen 500 MG Oral Tablet; Therapy: (Recorded:11Dec2012) to 2. Amiodarone HCl 200 MG Oral Tablet; Therapy: (Recorded:11Dec2012) to 3. AmLODIPine Besylate 5 MG Oral Tablet; Therapy: (Recorded:11Dec2012) to 4. Aspirin 81 MG Oral Tablet; Therapy: (Recorded:11Dec2012) to 5. Crestor 20 MG Oral Tablet; Therapy: (Recorded:11Dec2012) to 6. Metoprolol Tartrate 25 MG Oral Tablet; Therapy: (Recorded:11Dec2012) to 7. Multi-Day TABS; Therapy: (Recorded:11Dec2012) to 8. Omeprazole 20 MG Oral Capsule Delayed Release; Therapy: (Recorded:11Dec2012) to 9. [name] 150 MG Oral Capsule; Therapy: (Recorded:11Dec2012) to 10. PriLOSEC 20 MG Oral Capsule Delayed Release; Therapy: (Recorded:11Dec2012) to 11. Saw Palmetto CAPS; Therapy: (Recorded:11Dec2012) to  Allergies Medication  1. No Known Drug Allergies  Family History Problems  1. Family history of  Acute Myocardial Infarction V17.3 2. Family history of  Death In The Family Father 3. Family history of  Death In The Family Mother 4.  Family history of  Family Health Status Children ___ Living Sons 3 sons  Social History Problems  1. Never A Smoker 2. Retired From Work Denied  3. History of  Alcohol Use 4. History of  Caffeine Use  Vitals Vital Signs  BMI Calculated: 32.2 BSA Calculated: 2.23 Height: 5 ft 11 in Weight: 230 lb  Blood Pressure: 126 / 68 Temperature: 98.6 F Heart Rate: 56  Review of Systems Genitourinary, constitutional, skin, eye, otolaryngeal, hematologic/lymphatic, cardiovascular, pulmonary, endocrine, musculoskeletal, gastrointestinal, neurological and psychiatric system(s) were reviewed and pertinent findings if present are noted.  Genitourinary: nocturia and hematuria.  Gastrointestinal: melena.  Integumentary: skin rash/lesion and pruritus.    Vitals Vital Signs [Data Includes: Last 1 Day]  11Dec2012 10:02AM  BMI Calculated: 32.2 BSA Calculated: 2.23 Height: 5 ft 11 in Weight: 230 lb  Blood Pressure: 129 / 65 Heart Rate: 49  Physical Exam Constitutional: Well nourished and well developed . No acute distress.  ENT:. The ears and nose are normal in appearance.  Neck: The appearance of the neck is normal and no neck mass is present.  Pulmonary: No respiratory distress and normal respiratory rhythm and effort.  Cardiovascular: Heart rate and rhythm are normal . No peripheral edema.  Abdomen: The abdomen is mildly obese. The abdomen is soft and nontender. No masses are palpated. No CVA tenderness. No hernias are palpable. No hepatosplenomegaly noted.  Rectal: Rectal exam demonstrates normal sphincter tone, no tenderness and no masses. Estimated prostate size is 1+. The prostate has no nodularity and is not tender. The left seminal vesicle is nonpalpable. The right seminal vesicle is nonpalpable. The perineum is normal on inspection.  Genitourinary: Examination of the  penis demonstrates no discharge, no masses, no lesions and a normal meatus. The penis is circumcised. The scrotum is  without lesions. The right epididymis is palpably normal and non-tender. The left epididymis is palpably normal and non-tender. The right testis is non-tender and without masses. The left testis is non-tender and without masses.  Lymphatics: The femoral and inguinal nodes are not enlarged or tender.  Skin: Normal skin turgor, no visible rash and no visible skin lesions.  Neuro/Psych:. Mood and affect are appropriate.    Selected Results  AU CT-HEMATURIA PROTOCOL 13Dec2012 12:00AM Ihor Gully   Test Name Result Flag Reference  ** RADIOLOGY REPORT BY [facility] ** ORIGINAL APPROVED BY: Charline Bills, M.D. ON: 05/28/2011 13:47:46   *RADIOLOGY REPORT*  Clinical Data: Microscopic hematuria  CT ABDOMEN AND PELVIS WITHOUT AND WITH CONTRAST  Technique: Multidetector CT imaging of the abdomen and pelvis was performed without contrast material in one or both body regions, followed by contrast material(s) and further sections in one or both body regions.  Contrast: 125 ml Isovue 300 IV  Comparison: None.  Findings: Lung bases are essentially clear.  Liver, spleen, pancreas, and adrenal glands are within normal limits.  Gallbladder is notable for layering gallstones (series 2/24). No associated inflammatory changes.  6 mm nonobstructing right lower pole renal calculus. 2.2 x 2.0 cm posterior interpolar right renal cyst. Tiny left lower pole cyst (series 3/image 39). No hydronephrosis.  No evidence of bowel obstruction. Normal appendix. Colonic diverticulosis, without associated inflammatory changes.  Atherosclerotic calcifications of the abdominal aorta and branch vessels.  No abdominopelvic ascites.  No suspicious abdominopelvic lymphadenopathy.  Prostate is enlarged, measuring 5.0 cm in transverse dimension.  No ureteral or bladder calculi. Bladder is thick-walled but underdistended.  On delayed imaging, there are no filling defects in the  bilateral opacified proximal collecting systems, ureters, or bladder.  Degenerative changes of the visualized thoracolumbar spine.  IMPRESSION: 6 mm nonobstructing right lower pole renal calculus. No hydronephrosis.  Bilateral renal cysts. No enhancing renal lesions.  No ureteral or bladder calculi. No filling defects in the bilateral opacified proximal collecting systems, ureters, or bladder.  Cholelithiasis, without associated inflammatory changes.   BUN & CREATININE 11Dec2012 10:22AM Ihor Gully  SPECIMEN TYPE: BLOOD   Test Name Result Flag Reference  CREATININE 0.83 mg/dL  2.44-0.10  BUN 9 mg/dL  2-72  Est GFR, African American >89 mL/min    Est GFR, NonAfrican American 85 mL/min    The estimated GFR is a calculation valid for adults (66 to 76 years old) that uses the CKD-EPI algorithm to adjust for age and sex. It is not to be used for children, pregnant women, hospitalized patients, patients on dialysis, or with rapidly changing kidney function. According to the NKDEP, eGFR >89 is normal, 60-89 shows mild impairment, 30-59 shows moderate impairment, 15-29 shows severe impairment and <15 is ESRD.   Procedure  Procedure: Cystoscopy  Indication: Hematuria.  Informed Consent: Risks, benefits, and potential adverse events were discussed and informed consent was obtained from the patient.  Prep: The patient was prepped with betadine.  Procedure Note:  Urethral meatus:. No abnormalities.  Anterior urethra: No abnormalities.  Prostatic urethra:. The lateral and median prostatic lobes were enlarged.  Bladder: Visulization was clear. The ureteral orifices were in the normal anatomic position bilaterally and had clear efflux of urine. Examination of the bladder demonstrated mild trabeculation. A solitary tumor was visualized in the bladder. This tumor was located on the left side, near the trigone  of the bladder. The patient tolerated the procedure well.  Complications: None.     Assessment Assessed  1. 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 Microscopic Hematuria 599.72 3. Nephrolithiasis Of The Right Kidney 592.0 4. Benign Prostatic Hypertrophy With Urinary Obstruction 600.01 5. PSA,Elevated 790.93 6. Simple Renal Cyst In Both Kidneys 593.2       I went over the results of his workup with him which has revealed  normal renal function and a CT scan that has demonstrated bilateral, simple renal cyst of no clinical significance as well as a single 6 mm stone located in the lower pole of his right kidney. The location of the stone would make spontaneous passage slightly less likely however it is possible and therefore we have discussed the clinical symptoms that would suggest he is passing a stone so that he can return should that occur.  PSA is elevated at 6.38 and I was unable to obtain any previous PSAs for evaluation of his rate of rise. I therefore have recommended we repeat the PSA again in 6 months in order to determine if it is showing a persistent rise and also determine the rate of rise as well.   I found what appears to be the source of his gross hematuria today on cystoscopy. He has what appears to be a papillary lesion that is less than 5 mm in size and appears to be superficial just inside the bladder on the trigone on the left-hand side. I therefore have discussed with the patient the need to remove this transurethrally and have gone over the procedure with him including its risks and complications. He is on [name] and ideally I need to have him off of this medication for this procedure however if this cannot be stopped safely I could proceed with the procedure although the risk of postoperative bleeding would be increased.   Plan    1. I will have my office contact Dr. Tedra Senegal to see if it would be okay to stop his [name] for his upcoming procedure. 2. He will be scheduled for outpatient transurethral resection of his bladder  tumor.

## 2011-07-31 ENCOUNTER — Ambulatory Visit (HOSPITAL_COMMUNITY)
Admission: RE | Admit: 2011-07-31 | Discharge: 2011-07-31 | Disposition: A | Discharge status: Home / Self Care | Payer: Medicare Other | Source: Ambulatory Visit | Attending: Urology | Admitting: Urology

## 2011-07-31 ENCOUNTER — Ambulatory Visit (HOSPITAL_COMMUNITY): Payer: Medicare Other | Admitting: Anesthesiology

## 2011-07-31 ENCOUNTER — Other Ambulatory Visit: Payer: Self-pay | Admitting: Urology

## 2011-07-31 ENCOUNTER — Encounter (HOSPITAL_COMMUNITY)
Admission: RE | Disposition: A | Discharge status: Home / Self Care | Payer: Self-pay | Source: Ambulatory Visit | Attending: Urology

## 2011-07-31 ENCOUNTER — Encounter (HOSPITAL_COMMUNITY): Payer: Self-pay | Admitting: Anesthesiology

## 2011-07-31 ENCOUNTER — Encounter (HOSPITAL_COMMUNITY): Payer: Self-pay | Admitting: *Deleted

## 2011-07-31 DIAGNOSIS — R3129 Other microscopic hematuria: Secondary | ICD-10-CM | POA: Diagnosis not present

## 2011-07-31 DIAGNOSIS — N401 Enlarged prostate with lower urinary tract symptoms: Secondary | ICD-10-CM | POA: Insufficient documentation

## 2011-07-31 DIAGNOSIS — D494 Neoplasm of unspecified behavior of bladder: Secondary | ICD-10-CM | POA: Diagnosis not present

## 2011-07-31 DIAGNOSIS — N281 Cyst of kidney, acquired: Secondary | ICD-10-CM | POA: Diagnosis not present

## 2011-07-31 DIAGNOSIS — I4891 Unspecified atrial fibrillation: Secondary | ICD-10-CM | POA: Diagnosis not present

## 2011-07-31 DIAGNOSIS — I252 Old myocardial infarction: Secondary | ICD-10-CM | POA: Insufficient documentation

## 2011-07-31 DIAGNOSIS — Z01812 Encounter for preprocedural laboratory examination: Secondary | ICD-10-CM | POA: Insufficient documentation

## 2011-07-31 DIAGNOSIS — C675 Malignant neoplasm of bladder neck: Secondary | ICD-10-CM | POA: Diagnosis not present

## 2011-07-31 DIAGNOSIS — C679 Malignant neoplasm of bladder, unspecified: Secondary | ICD-10-CM

## 2011-07-31 DIAGNOSIS — R972 Elevated prostate specific antigen [PSA]: Secondary | ICD-10-CM | POA: Diagnosis not present

## 2011-07-31 DIAGNOSIS — N138 Other obstructive and reflux uropathy: Secondary | ICD-10-CM | POA: Insufficient documentation

## 2011-07-31 DIAGNOSIS — Z79899 Other long term (current) drug therapy: Secondary | ICD-10-CM | POA: Insufficient documentation

## 2011-07-31 DIAGNOSIS — M109 Gout, unspecified: Secondary | ICD-10-CM | POA: Insufficient documentation

## 2011-07-31 DIAGNOSIS — C670 Malignant neoplasm of trigone of bladder: Secondary | ICD-10-CM | POA: Diagnosis not present

## 2011-07-31 HISTORY — DX: Malignant neoplasm of bladder, unspecified: C67.9

## 2011-07-31 HISTORY — PX: TRANSURETHRAL RESECTION OF BLADDER TUMOR: SHX2575

## 2011-07-31 SURGERY — TURBT (TRANSURETHRAL RESECTION OF BLADDER TUMOR)
Anesthesia: General | Site: Bladder | Wound class: Clean Contaminated

## 2011-07-31 MED ORDER — PROPOFOL 10 MG/ML IV BOLUS
INTRAVENOUS | Status: DC | PRN
  Administered 2011-07-31: 200 mg via INTRAVENOUS

## 2011-07-31 MED ORDER — LACTATED RINGERS IV SOLN
INTRAVENOUS | Status: DC
  Administered 2011-07-31: 10:00:00 via INTRAVENOUS

## 2011-07-31 MED ORDER — HYDROMORPHONE HCL PF 1 MG/ML IJ SOLN: 0.2500 mg | INTRAMUSCULAR | Status: DC | PRN

## 2011-07-31 MED ORDER — SODIUM CHLORIDE 0.9 % IR SOLN
Status: DC | PRN
  Administered 2011-07-31: 3000 mL

## 2011-07-31 MED ORDER — PROMETHAZINE HCL 25 MG/ML IJ SOLN: 6.2500 mg | INTRAMUSCULAR | Status: DC | PRN

## 2011-07-31 MED ORDER — PHENAZOPYRIDINE HCL 200 MG PO TABS
200.0000 mg | ORAL_TABLET | Freq: Once | ORAL | Status: AC
  Administered 2011-07-31: 200 mg via ORAL

## 2011-07-31 MED ORDER — STERILE WATER FOR IRRIGATION IR SOLN
Status: DC | PRN
  Administered 2011-07-31: 3000 mL

## 2011-07-31 MED ORDER — CIPROFLOXACIN IN D5W 200 MG/100ML IV SOLN
200.0000 mg | INTRAVENOUS | Status: AC
  Administered 2011-07-31: 200 mg via INTRAVENOUS
  Filled 2011-07-31: qty 100

## 2011-07-31 MED ORDER — TAMSULOSIN HCL 0.4 MG PO CAPS
0.4000 mg | ORAL_CAPSULE | Freq: Once | ORAL | Status: AC
  Administered 2011-07-31: 0.4 mg via ORAL
  Filled 2011-07-31: qty 1

## 2011-07-31 MED ORDER — FENTANYL CITRATE 0.05 MG/ML IJ SOLN
INTRAMUSCULAR | Status: DC | PRN
  Administered 2011-07-31: 100 ug via INTRAVENOUS
  Administered 2011-07-31: 50 ug via INTRAVENOUS

## 2011-07-31 MED ORDER — ONDANSETRON HCL 4 MG/2ML IJ SOLN
INTRAMUSCULAR | Status: DC | PRN
  Administered 2011-07-31: 4 mg via INTRAVENOUS

## 2011-07-31 MED ORDER — PHENAZOPYRIDINE HCL 200 MG PO TABS: 200.0000 mg | ORAL_TABLET | Freq: Three times a day (TID) | ORAL | Status: AC | PRN

## 2011-07-31 MED ORDER — ACETAMINOPHEN 10 MG/ML IV SOLN
INTRAVENOUS | Status: AC
  Filled 2011-07-31: qty 100

## 2011-07-31 MED ORDER — PHENAZOPYRIDINE HCL 200 MG PO TABS
ORAL_TABLET | ORAL | Status: AC
  Filled 2011-07-31: qty 1

## 2011-07-31 MED ORDER — LIDOCAINE HCL (CARDIAC) 20 MG/ML IV SOLN
INTRAVENOUS | Status: DC | PRN
  Administered 2011-07-31: 75 mg via INTRAVENOUS

## 2011-07-31 MED ORDER — ACETAMINOPHEN 10 MG/ML IV SOLN
INTRAVENOUS | Status: DC | PRN
  Administered 2011-07-31: 1000 mg via INTRAVENOUS

## 2011-07-31 SURGICAL SUPPLY — 21 items
BAG URINE DRAINAGE (UROLOGICAL SUPPLIES) IMPLANT
BAG URO CATCHER STRL LF (DRAPE) ×2 IMPLANT
CLOTH BEACON ORANGE TIMEOUT ST (SAFETY) ×2 IMPLANT
DRAPE CAMERA CLOSED 9X96 (DRAPES) ×2 IMPLANT
ELECT REM PT RETURN 9FT ADLT (ELECTROSURGICAL) ×2
ELECT RESECT VAPORIZE 12D CBL (ELECTRODE) ×1 IMPLANT
ELECTRODE REM PT RTRN 9FT ADLT (ELECTROSURGICAL) ×1 IMPLANT
EVACUATOR MICROVAS BLADDER (UROLOGICAL SUPPLIES) IMPLANT
GLOVE BIOGEL M 8.0 STRL (GLOVE) ×2 IMPLANT
GOWN PREVENTION PLUS XLARGE (GOWN DISPOSABLE) ×2 IMPLANT
GOWN STRL REIN XL XLG (GOWN DISPOSABLE) ×2 IMPLANT
HOLDER FOLEY CATH W/STRAP (MISCELLANEOUS) IMPLANT
JUMPSUIT BLUE BOOT COVER DISP (PROTECTIVE WEAR) IMPLANT
KIT ASPIRATION TUBING (SET/KITS/TRAYS/PACK) ×1 IMPLANT
LOOPS RESECTOSCOPE DISP (ELECTROSURGICAL) ×1 IMPLANT
MANIFOLD NEPTUNE II (INSTRUMENTS) ×2 IMPLANT
NS IRRIG 1000ML POUR BTL (IV SOLUTION) ×1 IMPLANT
PACK CYSTO (CUSTOM PROCEDURE TRAY) ×2 IMPLANT
SYRINGE IRR TOOMEY STRL 70CC (SYRINGE) IMPLANT
TUBING CONNECTING 10 (TUBING) ×2 IMPLANT
WATER STERILE IRR 3000ML UROMA (IV SOLUTION) ×1 IMPLANT

## 2011-07-31 NOTE — Transfer of Care (Signed)
Immediate Anesthesia Transfer of Care Note  Patient: Evan Dorsey  Procedure(s) Performed: Procedure(s) (LRB): TRANSURETHRAL RESECTION OF BLADDER TUMOR (TURBT) (N/A)  Patient Location: PACU  Anesthesia Type: General  Level of Consciousness: awake, alert  and oriented  Airway & Oxygen Therapy: Patient Spontanous Breathing and Patient connected to face mask oxygen  Post-op Assessment: Report given to PACU RN and Post -op Vital signs reviewed and stable  Post vital signs: Reviewed and stable  Complications: No apparent anesthesia complications

## 2011-07-31 NOTE — Discharge Instructions (Addendum)

## 2011-07-31 NOTE — Anesthesia Preprocedure Evaluation (Addendum)
Anesthesia Evaluation  Patient identified by MRN, date of birth, ID band Patient awake  General Assessment Comment:HOH  Reviewed: Allergy & Precautions, H&P , NPO status , Patient's Chart, lab work & pertinent test results, reviewed documented beta blocker date and time   Airway Mallampati: II TM Distance: >3 FB Neck ROM: Full    Dental  (+) Upper Dentures and Partial Lower   Pulmonary neg pulmonary ROS,  clear to auscultation        Cardiovascular hypertension, Pt. on medications + CAD and + Past MI + dysrhythmias Atrial Fibrillation Regular Normal CAD, s/p CABG 1993 Currently asymptomatic   Neuro/Psych Negative Neurological ROS  Negative Psych ROS   GI/Hepatic negative GI ROS, Neg liver ROS,   Endo/Other  Negative Endocrine ROS  Renal/GU negative Renal ROS   Bladder lesion    Musculoskeletal negative musculoskeletal ROS (+)   Abdominal   Peds negative pediatric ROS (+)  Hematology negative hematology ROS (+)   Anesthesia Other Findings   Reproductive/Obstetrics negative OB ROS                          Anesthesia Physical Anesthesia Plan  ASA: III  Anesthesia Plan: General   Post-op Pain Management:    Induction: Intravenous  Airway Management Planned: LMA  Additional Equipment:   Intra-op Plan:   Post-operative Plan: Extubation in OR  Informed Consent: I have reviewed the patients History and Physical, chart, labs and discussed the procedure including the risks, benefits and alternatives for the proposed anesthesia with the patient or authorized representative who has indicated his/her understanding and acceptance.     Plan Discussed with: CRNA and Surgeon  Anesthesia Plan Comments:         Anesthesia Quick Evaluation

## 2011-07-31 NOTE — Op Note (Signed)
PATIENT:  Evan Dorsey  PRE-OPERATIVE DIAGNOSIS: Bladder tumor  POST-OPERATIVE DIAGNOSIS: Same  PROCEDURE:  Procedure(s): TRANSURETHRAL RESECTION OF BLADDER TUMOR (TURBT) (0.5cm.)  SURGEON:  Surgeon(s): Garnett Farm  ANESTHESIA:   General  EBL:  None  SPECIMEN:  Source of Specimen:  Bladder tumor  DISPOSITION OF SPECIMEN:  PATHOLOGY  Indication: [name] is a 78 year old male who was evaluated for microscopic hematuria. He was found on CT scan to have a 6 mm stone in the right lower pole of his kidney without evidence of obstruction. There were no renal lesions identified and the ureters were also noted be normal. Cystoscopically I found a papillary tumor in his bladder it appeared to be about 5 mm in size just inside the bladder on the trigone on the left-hand side. He is brought to the operating room today for resectional biopsy of the lesion.  Description of operation: The patient was taken to the operating room and administered general anesthesia. He was then placed on the table and moved to the dorsal lithotomy position after which his genitalia was sterilely prepped and draped. An official timeout was then performed.  The 22 French cystoscope with 12 lens was then passed under direct vision down the urethra which was noted be normal. The prostatic urethra revealed no lesions. There was mild bilobar hypertrophy. Upon entering the bladder I noted ureteral orifices were slitlike but somewhat patulous. The small tumor was identified the left of midline just inside the bladder neck but well away from the left ureteral orifice. The remainder of the bladder was reinspected and noted to be free of any other tumor stones or inflammatory lesions.  The cold cup biopsy forceps were then introduced and used to obtain a cold cup biopsy of the lesion. This was sent to pathology. I then introduced the Bugbee electrode and fulgurated the location of the tumor and the mucosa surrounding this  location. The bladder was then drained and the cystoscope was removed. The patient was awakened and taken to recovery room in stable and satisfactory condition. He tolerated the procedure well with no intraoperative complications.  PLAN OF CARE: Discharge to home after PACU  PATIENT DISPOSITION:  PACU - hemodynamically stable.

## 2011-07-31 NOTE — Preoperative (Signed)
Beta Blockers   Reason not to administer Beta Blockers:Patient states stop taking Metoprolol on 07/25/11 per Dr. Riley Kill

## 2011-07-31 NOTE — Interval H&P Note (Signed)
History and Physical Interval Note:  07/31/2011 10:18 AM  Evan Dorsey  has presented today for surgery, with the diagnosis of bladder tumor  The various methods of treatment have been discussed with the patient and family. After consideration of risks, benefits and other options for treatment, the patient has consented to  Procedure(s) (LRB): TRANSURETHRAL RESECTION OF BLADDER TUMOR (TURBT) (N/A) as a surgical intervention .  The patients' history has been reviewed, patient examined, no change in status, stable for surgery.  I have reviewed the patients' chart and labs.  Questions were answered to the patient's satisfaction.     Garnett Farm

## 2011-07-31 NOTE — Anesthesia Postprocedure Evaluation (Signed)
  Anesthesia Post-op Note  Patient: Evan Dorsey  Procedure(s) Performed: Procedure(s) (LRB): TRANSURETHRAL RESECTION OF BLADDER TUMOR (TURBT) (N/A)  Patient Location: PACU  Anesthesia Type: General  Level of Consciousness: oriented and sedated  Airway and Oxygen Therapy: Patient Spontanous Breathing  Post-op Pain: mild  Post-op Assessment: Post-op Vital signs reviewed, Patient's Cardiovascular Status Stable, Respiratory Function Stable and Patent Airway  Post-op Vital Signs: stable  Complications: No apparent anesthesia complications

## 2011-08-07 DIAGNOSIS — N200 Calculus of kidney: Secondary | ICD-10-CM | POA: Diagnosis not present

## 2011-08-07 DIAGNOSIS — C679 Malignant neoplasm of bladder, unspecified: Secondary | ICD-10-CM | POA: Diagnosis not present

## 2011-08-07 DIAGNOSIS — R3129 Other microscopic hematuria: Secondary | ICD-10-CM | POA: Diagnosis not present

## 2011-08-07 DIAGNOSIS — N401 Enlarged prostate with lower urinary tract symptoms: Secondary | ICD-10-CM | POA: Diagnosis not present

## 2011-08-07 NOTE — Assessment & Plan Note (Signed)
Maintaining NSR on amiodarone.  Need to watch labs.  Stable to come off of [name] for urologic procedure. Should hold for 3-5 days.

## 2011-08-07 NOTE — Assessment & Plan Note (Signed)
Controlled at present.  

## 2011-08-07 NOTE — Assessment & Plan Note (Signed)
Has been stable from this standpoint.

## 2011-08-07 NOTE — Assessment & Plan Note (Signed)
Followed by his primary care MD.   

## 2011-08-17 ENCOUNTER — Encounter (HOSPITAL_COMMUNITY): Payer: Self-pay | Admitting: Urology

## 2011-08-19 ENCOUNTER — Other Ambulatory Visit: Payer: Self-pay | Admitting: Cardiology

## 2011-09-04 ENCOUNTER — Ambulatory Visit (INDEPENDENT_AMBULATORY_CARE_PROVIDER_SITE_OTHER): Payer: Medicare Other | Admitting: Cardiology

## 2011-09-04 ENCOUNTER — Encounter: Payer: Self-pay | Admitting: Cardiology

## 2011-09-04 VITALS — BP 132/76 | HR 57 | Ht 71.5 in | Wt 229.1 lb

## 2011-09-04 DIAGNOSIS — I4891 Unspecified atrial fibrillation: Secondary | ICD-10-CM

## 2011-09-04 DIAGNOSIS — L259 Unspecified contact dermatitis, unspecified cause: Secondary | ICD-10-CM | POA: Diagnosis not present

## 2011-09-04 DIAGNOSIS — D235 Other benign neoplasm of skin of trunk: Secondary | ICD-10-CM | POA: Diagnosis not present

## 2011-09-04 DIAGNOSIS — I2510 Atherosclerotic heart disease of native coronary artery without angina pectoris: Secondary | ICD-10-CM | POA: Diagnosis not present

## 2011-09-04 DIAGNOSIS — B353 Tinea pedis: Secondary | ICD-10-CM | POA: Diagnosis not present

## 2011-09-04 LAB — T4, FREE: Free T4: 1.09 ng/dL (ref 0.60–1.60)

## 2011-09-04 LAB — TSH: TSH: 3.16 u[IU]/mL (ref 0.35–5.50)

## 2011-09-04 LAB — HEPATIC FUNCTION PANEL
ALT: 23 U/L (ref 0–53)
Total Bilirubin: 0.4 mg/dL (ref 0.3–1.2)

## 2011-09-04 NOTE — Assessment & Plan Note (Signed)
Controlled.  

## 2011-09-04 NOTE — Progress Notes (Signed)
HPI:  He is doing well.  Denies shortness of breath.  Denies cough.  Feels ok.  No hematuria, and no excess bleeding.    Current Outpatient Prescriptions  Medication Sig Dispense Refill  . acetaminophen (TYLENOL) 500 MG tablet Take 500 mg by mouth every 6 (six) hours as needed. For pain      . amiodarone (PACERONE) 200 MG tablet Take 200 mg by mouth at bedtime.      Marland Kitchen amLODipine (NORVASC) 5 MG tablet Take 5 mg by mouth daily before breakfast.      . aspirin (ECOTRIN LOW STRENGTH) 81 MG EC tablet Take 81 mg by mouth daily. Swallow whole.      . calcium carbonate (TUMS - DOSED IN MG ELEMENTAL CALCIUM) 500 MG chewable tablet Chew 2 tablets by mouth daily.      . clobetasol cream (TEMOVATE) 0.05 % Apply 1 application topically 2 (two) times daily.      Marland Kitchen ketoconazole (NIZORAL) 2 % cream Apply 1 application topically 2 (two) times daily.      . Methylcellulose, Laxative, 500 MG TABS Take 2 tablets by mouth 2 (two) times daily.       Marland Kitchen omeprazole (PRILOSEC OTC) 20 MG tablet Take 20 mg by mouth daily.       Marland Kitchen [name] 150 MG CAPS TAKE ONE CAPSULE BY MOUTH EVERY TWELVE HOURS  60 capsule  11  . rosuvastatin (CRESTOR) 20 MG tablet Take 20 mg by mouth at bedtime.       . Saw Palmetto, Serenoa repens, (SAW PALMETTO PO) Take 2 tablets by mouth 2 (two) times daily.       . metoprolol succinate (TOPROL-XL) 25 MG 24 hr tablet Take one-half tablet on Thursday and Friday and then discontinue medication on 07/25/11.        No Known Allergies  Past Medical History  Diagnosis Date  . Tinnitus   . Elevated PSA   . HLD (hyperlipidemia)   . Kidney stone   . Diverticulosis   . Gout   . Adenomatous polyp   . Myocardial infarction     silent- 20 yrs ago  . Shortness of breath     pre bypass  . HTN (hypertension)   . Dysrhythmia     atrial fibrilliation/ pradaxa. Per office aware of stopping  [name]  7 days and aspirin 5 days, note on chart  . CAD (coronary artery disease)     LOV DR STUCKY  with EKG  07/22/11 EPIC/  note regarding anticoagulants per Dr Riley Kill on chart,  last stress test 2009, eccho and chest x ray 3/12 EPIC, PFT 7/12 EPIC,  . Bladder cancer 07/31/2011    Past Surgical History  Procedure Date  . Coronary artery bypass graft 1993  . Orif acetabular fracture 2008    trimalleolar fracture  . Cardiovascular stress test 02/10/08    6 mets, poor tolerance, no chest pain   . Colonoscopy   . Cardiac catheterization     total occlusion to LAD, LCX, RCA. LIMA patent, normal seq SVG to OM, severe stenosis in SVG to PDA, but collaterals to PLA from OM. RCA went to PDA. treated medically   . Transurethral resection of bladder tumor 07/31/2011    Procedure: TRANSURETHRAL RESECTION OF BLADDER TUMOR (TURBT);  Surgeon: Garnett Farm, MD;  Location: [facility];  Service: Urology;  Laterality: N/A;  with Gyrus    Family History  Problem Relation Age of Onset  . Heart attack Father   .  Heart attack Mother   . Heart attack Brother     History   Social History  . Marital Status: Married    Spouse Name: N/A    Number of Children: N/A  . Years of Education: N/A   Occupational History  . retired Visual merchandiser    Social History Main Topics  . Smoking status: Never Smoker   . Smokeless tobacco: Never Used  . Alcohol Use: No  . Drug Use: No  . Sexually Active: Not on file   Other Topics Concern  . Not on file   Social History Narrative  . No narrative on file    ROS: Please see the HPI.  All other systems reviewed and negative.  PHYSICAL EXAM:  BP 132/76  Pulse 57  Ht 5' 11.5" (1.816 m)  Wt 229 lb 1.9 oz (103.928 kg)  BMI 31.51 kg/m2  General: Well developed, well nourished, in no acute distress. Head:  Normocephalic and atraumatic. Neck: no JVD Lungs: Clear to auscultation and percussion. Heart: Normal S1 and S2. Minimal SEM.  No DM.  Pulses: Pulses normal in all 4 extremities. Extremities: No clubbing or cyanosis. No edema. Neurologic: Alert and oriented x 3.  EKG:   SB.  RBBB.  Inferior MI, old.   No changes.   ASSESSMENT AND PLAN:

## 2011-09-04 NOTE — Assessment & Plan Note (Signed)
No recurrent symptoms at present.

## 2011-09-04 NOTE — Assessment & Plan Note (Addendum)
Stable at present.  Maintaining NSR.  Will check liver profile, and also thyroid function.  In four months, will do CXR, and PFTs.  Bleeding risk, and ASA use discussed in detail.  Will continue for now.

## 2011-09-04 NOTE — Assessment & Plan Note (Signed)
Followed by [facility] 

## 2011-09-04 NOTE — Patient Instructions (Signed)
Your physician recommends that you have lab work today: TSH, Free T4, Liver  Your physician has recommended that you have a pulmonary function test in 4 MONTHS. Pulmonary Function Tests are a group of tests that measure how well air moves in and out of your lungs.  Your physician recommends that you have a chest x-ray in 4 MONTHS.  Your physician recommends that you continue on your current medications as directed. Please refer to the Current Medication list given to you today.  Your physician wants you to follow-up in: 4 MONTHS.  You will receive a reminder letter in the mail two months in advance. If you don't receive a letter, please call our office to schedule the follow-up appointment.

## 2011-09-14 ENCOUNTER — Encounter: Payer: Self-pay | Admitting: Cardiology

## 2011-09-14 NOTE — Telephone Encounter (Signed)
Fu call °Pt returning your call  °

## 2011-09-14 NOTE — Telephone Encounter (Signed)
This encounter was created in error - please disregard.

## 2011-09-19 ENCOUNTER — Other Ambulatory Visit: Payer: Self-pay | Admitting: Cardiology

## 2011-10-16 DIAGNOSIS — H2589 Other age-related cataract: Secondary | ICD-10-CM | POA: Diagnosis not present

## 2011-11-04 DIAGNOSIS — Z8551 Personal history of malignant neoplasm of bladder: Secondary | ICD-10-CM | POA: Diagnosis not present

## 2011-11-04 DIAGNOSIS — R972 Elevated prostate specific antigen [PSA]: Secondary | ICD-10-CM | POA: Diagnosis not present

## 2011-11-25 DIAGNOSIS — Z1212 Encounter for screening for malignant neoplasm of rectum: Secondary | ICD-10-CM | POA: Diagnosis not present

## 2011-11-25 DIAGNOSIS — Z125 Encounter for screening for malignant neoplasm of prostate: Secondary | ICD-10-CM | POA: Diagnosis not present

## 2011-11-25 DIAGNOSIS — N401 Enlarged prostate with lower urinary tract symptoms: Secondary | ICD-10-CM | POA: Diagnosis not present

## 2011-11-25 DIAGNOSIS — I10 Essential (primary) hypertension: Secondary | ICD-10-CM | POA: Diagnosis not present

## 2011-11-25 DIAGNOSIS — Z87898 Personal history of other specified conditions: Secondary | ICD-10-CM | POA: Diagnosis not present

## 2011-11-25 DIAGNOSIS — E7800 Pure hypercholesterolemia, unspecified: Secondary | ICD-10-CM | POA: Diagnosis not present

## 2011-11-25 DIAGNOSIS — I2510 Atherosclerotic heart disease of native coronary artery without angina pectoris: Secondary | ICD-10-CM | POA: Diagnosis not present

## 2011-12-01 ENCOUNTER — Other Ambulatory Visit: Payer: Self-pay | Admitting: Cardiology

## 2011-12-03 DIAGNOSIS — Z86010 Personal history of colonic polyps: Secondary | ICD-10-CM | POA: Diagnosis not present

## 2011-12-03 DIAGNOSIS — R000 Tachycardia, unspecified: Secondary | ICD-10-CM | POA: Diagnosis not present

## 2011-12-30 ENCOUNTER — Other Ambulatory Visit: Payer: Self-pay | Admitting: Gastroenterology

## 2011-12-30 DIAGNOSIS — Z86010 Personal history of colonic polyps: Secondary | ICD-10-CM | POA: Diagnosis not present

## 2011-12-30 DIAGNOSIS — D126 Benign neoplasm of colon, unspecified: Secondary | ICD-10-CM | POA: Diagnosis not present

## 2011-12-30 DIAGNOSIS — K5730 Diverticulosis of large intestine without perforation or abscess without bleeding: Secondary | ICD-10-CM | POA: Diagnosis not present

## 2011-12-30 DIAGNOSIS — Z129 Encounter for screening for malignant neoplasm, site unspecified: Secondary | ICD-10-CM | POA: Diagnosis not present

## 2012-01-04 ENCOUNTER — Encounter: Payer: Self-pay | Admitting: Cardiology

## 2012-01-04 ENCOUNTER — Ambulatory Visit (INDEPENDENT_AMBULATORY_CARE_PROVIDER_SITE_OTHER): Payer: Medicare Other | Admitting: Cardiology

## 2012-01-04 VITALS — BP 118/62 | HR 53 | Ht 71.5 in | Wt 225.0 lb

## 2012-01-04 DIAGNOSIS — I25810 Atherosclerosis of coronary artery bypass graft(s) without angina pectoris: Secondary | ICD-10-CM

## 2012-01-04 DIAGNOSIS — I4891 Unspecified atrial fibrillation: Secondary | ICD-10-CM

## 2012-01-04 DIAGNOSIS — E7800 Pure hypercholesterolemia, unspecified: Secondary | ICD-10-CM

## 2012-01-04 DIAGNOSIS — I10 Essential (primary) hypertension: Secondary | ICD-10-CM | POA: Diagnosis not present

## 2012-01-04 DIAGNOSIS — I2510 Atherosclerotic heart disease of native coronary artery without angina pectoris: Secondary | ICD-10-CM

## 2012-01-04 NOTE — Progress Notes (Signed)
HPI: Patient is doing well from a clinical standpoint. He's not having any major problems. He is tolerating his medicines well. He denies any syncope or presyncope. His most recent laboratory studies were satisfactory.  Colonoscopy has been recommended by Dr. Garald Braver would be reasonable to hold [name] during this period for his procedure.    Current Outpatient Prescriptions  Medication Sig Dispense Refill  . acetaminophen (TYLENOL) 500 MG tablet Take 500 mg by mouth every 6 (six) hours as needed. For pain      . amiodarone (PACERONE) 200 MG tablet TAKE ONE TABLET EVERY DAY  30 tablet  9  . amLODipine (NORVASC) 5 MG tablet TAKE ONE TABLET EVERY DAY  90 tablet  3  . aspirin (ECOTRIN LOW STRENGTH) 81 MG EC tablet Take 81 mg by mouth daily. Swallow whole.      . calcium carbonate (TUMS - DOSED IN MG ELEMENTAL CALCIUM) 500 MG chewable tablet Chew 2 tablets by mouth daily.      . Methylcellulose, Laxative, 500 MG TABS Take 2 tablets by mouth 2 (two) times daily.       Marland Kitchen [name] 150 MG CAPS TAKE ONE CAPSULE BY MOUTH EVERY TWELVE HOURS  60 capsule  11  . rosuvastatin (CRESTOR) 20 MG tablet Take 20 mg by mouth at bedtime.       . Tamsulosin HCl (FLOMAX) 0.4 MG CAPS Take 1 capsule by mouth daily.      Marland Kitchen DISCONTD: amiodarone (PACERONE) 200 MG tablet Take 200 mg by mouth at bedtime.      Marland Kitchen DISCONTD: amLODipine (NORVASC) 5 MG tablet Take 5 mg by mouth daily before breakfast.        No Known Allergies  Past Medical History  Diagnosis Date  . Tinnitus   . Elevated PSA   . HLD (hyperlipidemia)   . Kidney stone   . Diverticulosis   . Gout   . Adenomatous polyp   . Myocardial infarction     silent- 20 yrs ago  . Shortness of breath     pre bypass  . HTN (hypertension)   . Dysrhythmia     atrial fibrilliation/ pradaxa. Per office aware of stopping  [name]  7 days and aspirin 5 days, note on chart  . CAD (coronary artery disease)     LOV DR STUCKY  with EKG 07/22/11 EPIC/  note regarding  anticoagulants per Dr Riley Kill on chart,  last stress test 2009, eccho and chest x ray 3/12 EPIC, PFT 7/12 EPIC,  . Bladder cancer 07/31/2011    Past Surgical History  Procedure Date  . Coronary artery bypass graft 1993  . Orif acetabular fracture 2008    trimalleolar fracture  . Cardiovascular stress test 02/10/08    6 mets, poor tolerance, no chest pain   . Colonoscopy   . Cardiac catheterization     total occlusion to LAD, LCX, RCA. LIMA patent, normal seq SVG to OM, severe stenosis in SVG to PDA, but collaterals to PLA from OM. RCA went to PDA. treated medically   . Transurethral resection of bladder tumor 07/31/2011    Procedure: TRANSURETHRAL RESECTION OF BLADDER TUMOR (TURBT);  Surgeon: Garnett Farm, MD;  Location: [facility];  Service: Urology;  Laterality: N/A;  with Gyrus    Family History  Problem Relation Age of Onset  . Heart attack Father   . Heart attack Mother   . Heart attack Brother     History   Social History  . Marital  Status: Married    Spouse Name: N/A    Number of Children: N/A  . Years of Education: N/A   Occupational History  . retired Visual merchandiser    Social History Main Topics  . Smoking status: Never Smoker   . Smokeless tobacco: Never Used  . Alcohol Use: No  . Drug Use: No  . Sexually Active: Not on file   Other Topics Concern  . Not on file   Social History Narrative  . No narrative on file    ROS: Please see the HPI.  All other systems reviewed and negative.  PHYSICAL EXAM:  BP 118/62  Pulse 53  Ht 5' 11.5" (1.816 m)  Wt 225 lb (102.059 kg)  BMI 30.94 kg/m2  General: Well developed, well nourished, in no acute distress. Head:  Normocephalic and atraumatic. Neck: no JVD Lungs: Clear to auscultation and percussion. Heart: Normal S1 and S2.  No murmur, rubs or gallops. HS somewhat distant  Abdomen:  Normal bowel sounds; soft; non tender; no organomegaly Pulses: Pulses normal in all 4 extremities. Extremities: No clubbing or  cyanosis. No edema. Neurologic: Alert and oriented x 3.  EKG:  SB.  RBBB.   ASSESSMENT AND PLAN:

## 2012-01-04 NOTE — Patient Instructions (Addendum)
Your physician recommends that you continue on your current medications as directed. Please refer to the Current Medication list given to you today.  Your physician recommends that you schedule a follow-up appointment in: 2-3 MONTHS with Dr Riley Kill

## 2012-01-10 NOTE — Assessment & Plan Note (Addendum)
Maintaining NSR.  Remains on low dose amiodarone.  Labs ok.  CXR and PFT pending.  Continue to follow.

## 2012-01-10 NOTE — Assessment & Plan Note (Signed)
No angina at present.  

## 2012-01-10 NOTE — Assessment & Plan Note (Signed)
Controlled.  

## 2012-01-10 NOTE — Assessment & Plan Note (Addendum)
Followed by Dr. Renne Crigler.  LDL 49.  HDL  39

## 2012-01-15 DIAGNOSIS — N400 Enlarged prostate without lower urinary tract symptoms: Secondary | ICD-10-CM | POA: Diagnosis not present

## 2012-01-15 DIAGNOSIS — K219 Gastro-esophageal reflux disease without esophagitis: Secondary | ICD-10-CM | POA: Diagnosis not present

## 2012-02-01 DIAGNOSIS — R972 Elevated prostate specific antigen [PSA]: Secondary | ICD-10-CM | POA: Diagnosis not present

## 2012-02-09 DIAGNOSIS — Z8551 Personal history of malignant neoplasm of bladder: Secondary | ICD-10-CM | POA: Diagnosis not present

## 2012-02-09 DIAGNOSIS — R972 Elevated prostate specific antigen [PSA]: Secondary | ICD-10-CM | POA: Diagnosis not present

## 2012-04-05 ENCOUNTER — Ambulatory Visit: Payer: Medicare Other | Admitting: Cardiology

## 2012-04-18 ENCOUNTER — Ambulatory Visit (INDEPENDENT_AMBULATORY_CARE_PROVIDER_SITE_OTHER): Payer: Medicare Other | Admitting: Cardiology

## 2012-04-18 ENCOUNTER — Encounter: Payer: Self-pay | Admitting: Cardiology

## 2012-04-18 VITALS — BP 131/74 | HR 54 | Wt 226.0 lb

## 2012-04-18 DIAGNOSIS — I4891 Unspecified atrial fibrillation: Secondary | ICD-10-CM

## 2012-04-18 DIAGNOSIS — I2510 Atherosclerotic heart disease of native coronary artery without angina pectoris: Secondary | ICD-10-CM | POA: Diagnosis not present

## 2012-04-18 LAB — BASIC METABOLIC PANEL
GFR: 75.13 mL/min (ref >60.00)
Glucose, Bld: 85 mg/dL (ref 70–99)
Potassium: 3.7 mEq/L (ref 3.5–5.1)
Sodium: 141 mEq/L (ref 135–145)

## 2012-04-18 LAB — CBC WITH DIFFERENTIAL/PLATELET
Eosinophils Relative: 0.9 % (ref 0.0–5.0)
HCT: 42.9 % (ref 39.0–52.0)
Hemoglobin: 14.3 g/dL (ref 13.0–17.0)
Lymphs Abs: 2.1 10*3/uL (ref 0.7–4.0)
Monocytes Relative: 13.3 % — ABNORMAL HIGH (ref 3.0–12.0)
Neutro Abs: 5.8 10*3/uL (ref 1.4–7.7)
RDW: 13.6 % (ref 11.5–14.6)
WBC: 9.2 10*3/uL (ref 4.5–10.5)

## 2012-04-18 LAB — HEPATIC FUNCTION PANEL
Albumin: 4 g/dL (ref 3.5–5.2)
Alkaline Phosphatase: 48 U/L (ref 39–117)

## 2012-04-18 NOTE — Progress Notes (Signed)
HPI:  Patient returns in followup. From clinical standpoint he is doing really quite well. He is no major complaints. He is no obvious side effects from the current medications that he is taking. His medication bill is high however.  Current Outpatient Prescriptions  Medication Sig Dispense Refill  . acetaminophen (TYLENOL) 500 MG tablet Take 500 mg by mouth every 6 (six) hours as needed. For pain      . amiodarone (PACERONE) 200 MG tablet TAKE ONE TABLET EVERY DAY  30 tablet  9  . amLODipine (NORVASC) 5 MG tablet TAKE ONE TABLET EVERY DAY  90 tablet  3  . aspirin (ECOTRIN LOW STRENGTH) 81 MG EC tablet Take 81 mg by mouth daily. Swallow whole.      . calcium carbonate (TUMS - DOSED IN MG ELEMENTAL CALCIUM) 500 MG chewable tablet Chew 2 tablets by mouth daily.      . Methylcellulose, Laxative, 500 MG TABS Take 2 tablets by mouth 2 (two) times daily.       Marland Kitchen [name] 150 MG CAPS TAKE ONE CAPSULE BY MOUTH EVERY TWELVE HOURS  60 capsule  11  . rosuvastatin (CRESTOR) 20 MG tablet Take 20 mg by mouth at bedtime.         No Known Allergies  Past Medical History  Diagnosis Date  . Tinnitus   . Elevated PSA   . HLD (hyperlipidemia)   . Kidney stone   . Diverticulosis   . Gout   . Adenomatous polyp   . Myocardial infarction     silent- 20 yrs ago  . Shortness of breath     pre bypass  . HTN (hypertension)   . Dysrhythmia     atrial fibrilliation/ pradaxa. Per office aware of stopping  [name]  7 days and aspirin 5 days, note on chart  . CAD (coronary artery disease)     LOV DR STUCKY  with EKG 07/22/11 EPIC/  note regarding anticoagulants per Dr Riley Kill on chart,  last stress test 2009, eccho and chest x ray 3/12 EPIC, PFT 7/12 EPIC,  . Bladder cancer 07/31/2011    Past Surgical History  Procedure Date  . Coronary artery bypass graft 1993  . Orif acetabular fracture 2008    trimalleolar fracture  . Cardiovascular stress test 02/10/08    6 mets, poor tolerance, no chest pain   .  Colonoscopy   . Cardiac catheterization     total occlusion to LAD, LCX, RCA. LIMA patent, normal seq SVG to OM, severe stenosis in SVG to PDA, but collaterals to PLA from OM. RCA went to PDA. treated medically   . Transurethral resection of bladder tumor 07/31/2011    Procedure: TRANSURETHRAL RESECTION OF BLADDER TUMOR (TURBT);  Surgeon: Garnett Farm, MD;  Location: [facility];  Service: Urology;  Laterality: N/A;  with Gyrus    Family History  Problem Relation Age of Onset  . Heart attack Father   . Heart attack Mother   . Heart attack Brother     History   Social History  . Marital Status: Married    Spouse Name: N/A    Number of Children: N/A  . Years of Education: N/A   Occupational History  . retired Visual merchandiser    Social History Main Topics  . Smoking status: Never Smoker   . Smokeless tobacco: Never Used  . Alcohol Use: No  . Drug Use: No  . Sexually Active: Not on file   Other Topics Concern  .  Not on file   Social History Narrative  . No narrative on file    ROS: Please see the HPI.  All other systems reviewed and negative.  PHYSICAL EXAM:  BP 131/74  Pulse 54  Wt 226 lb (102.513 kg)  General: Well developed, well nourished, in no acute distress. Head:  Normocephalic and atraumatic. Neck: no JVD Lungs: Clear to auscultation and percussion. Heart: Normal S1 and S2.  No murmur, rubs or gallops.  Pulses: Pulses normal in all 4 extremities. Extremities: No clubbing or cyanosis. No edema. Neurologic: Alert and oriented x 3.  EKG:  SB.  RBBB..  Inferior and Lateral MI, indeterminate age.  QTc .  ASSESSMENT AND PLAN:

## 2012-04-18 NOTE — Patient Instructions (Addendum)
Your physician recommends that you have lab work today: LIVER, TSH, Free T4, BMP, CBC  Your physician recommends that you continue on your current medications as directed. Please refer to the Current Medication list given to you today.  Your physician recommends that you schedule a follow-up appointment in: MARCH 2014  Your physician has recommended that you have a pulmonary function test. Pulmonary Function Tests are a group of tests that measure how well air moves in and out of your lungs.

## 2012-04-18 NOTE — Assessment & Plan Note (Signed)
He is stable at this time. Continued medical therapy

## 2012-04-18 NOTE — Assessment & Plan Note (Signed)
The patient remains on amiodarone, and is also on a new anticoagulant. He'll remain on his current medical regimen. We will see him back in followup in 4 months. Appropriate laboratory studies for amiodarone will be obtained.

## 2012-04-20 ENCOUNTER — Encounter: Payer: Self-pay | Admitting: Cardiology

## 2012-04-20 NOTE — Telephone Encounter (Signed)
New Problem:    Patient's wife returned your call about her husbands lab work.  Please call back.

## 2012-04-20 NOTE — Telephone Encounter (Signed)
This encounter was created in error - please disregard.

## 2012-04-28 ENCOUNTER — Ambulatory Visit (INDEPENDENT_AMBULATORY_CARE_PROVIDER_SITE_OTHER): Payer: Medicare Other | Admitting: Internal Medicine

## 2012-04-28 DIAGNOSIS — I4891 Unspecified atrial fibrillation: Secondary | ICD-10-CM

## 2012-04-28 DIAGNOSIS — M19019 Primary osteoarthritis, unspecified shoulder: Secondary | ICD-10-CM | POA: Diagnosis not present

## 2012-04-28 DIAGNOSIS — M25519 Pain in unspecified shoulder: Secondary | ICD-10-CM | POA: Diagnosis not present

## 2012-04-28 DIAGNOSIS — I2510 Atherosclerotic heart disease of native coronary artery without angina pectoris: Secondary | ICD-10-CM

## 2012-04-28 LAB — PULMONARY FUNCTION TEST

## 2012-04-28 NOTE — Progress Notes (Signed)
PFT done today. 

## 2012-05-17 DIAGNOSIS — Z8551 Personal history of malignant neoplasm of bladder: Secondary | ICD-10-CM | POA: Diagnosis not present

## 2012-05-17 DIAGNOSIS — R972 Elevated prostate specific antigen [PSA]: Secondary | ICD-10-CM | POA: Diagnosis not present

## 2012-05-17 DIAGNOSIS — N401 Enlarged prostate with lower urinary tract symptoms: Secondary | ICD-10-CM | POA: Diagnosis not present

## 2012-05-25 ENCOUNTER — Encounter: Payer: Self-pay | Admitting: Cardiology

## 2012-05-25 DIAGNOSIS — H2510 Age-related nuclear cataract, unspecified eye: Secondary | ICD-10-CM | POA: Diagnosis not present

## 2012-05-25 NOTE — Telephone Encounter (Signed)
Pt wife Evan Dorsey returning nurse call, she can be reached at [phone]

## 2012-05-25 NOTE — Telephone Encounter (Signed)
This encounter was created in error - please disregard.

## 2012-06-15 HISTORY — PX: CATARACT EXTRACTION W/ INTRAOCULAR LENS IMPLANT: SHX1309

## 2012-06-28 DIAGNOSIS — H2510 Age-related nuclear cataract, unspecified eye: Secondary | ICD-10-CM | POA: Diagnosis not present

## 2012-07-06 ENCOUNTER — Observation Stay (HOSPITAL_COMMUNITY)
Admission: EM | Admit: 2012-07-06 | Discharge: 2012-07-08 | Disposition: A | Discharge status: Home / Self Care | Payer: Medicare Other | Attending: Cardiology | Admitting: Cardiology

## 2012-07-06 ENCOUNTER — Encounter (HOSPITAL_COMMUNITY): Payer: Self-pay | Admitting: Emergency Medicine

## 2012-07-06 ENCOUNTER — Emergency Department (HOSPITAL_COMMUNITY): Payer: Medicare Other

## 2012-07-06 DIAGNOSIS — I4510 Unspecified right bundle-branch block: Secondary | ICD-10-CM | POA: Diagnosis not present

## 2012-07-06 DIAGNOSIS — Z8551 Personal history of malignant neoplasm of bladder: Secondary | ICD-10-CM | POA: Insufficient documentation

## 2012-07-06 DIAGNOSIS — R072 Precordial pain: Secondary | ICD-10-CM | POA: Diagnosis not present

## 2012-07-06 DIAGNOSIS — E876 Hypokalemia: Secondary | ICD-10-CM | POA: Diagnosis not present

## 2012-07-06 DIAGNOSIS — E785 Hyperlipidemia, unspecified: Secondary | ICD-10-CM | POA: Diagnosis not present

## 2012-07-06 DIAGNOSIS — R079 Chest pain, unspecified: Secondary | ICD-10-CM

## 2012-07-06 DIAGNOSIS — I25810 Atherosclerosis of coronary artery bypass graft(s) without angina pectoris: Secondary | ICD-10-CM | POA: Diagnosis present

## 2012-07-06 DIAGNOSIS — J069 Acute upper respiratory infection, unspecified: Secondary | ICD-10-CM | POA: Diagnosis not present

## 2012-07-06 DIAGNOSIS — I10 Essential (primary) hypertension: Secondary | ICD-10-CM | POA: Diagnosis present

## 2012-07-06 DIAGNOSIS — R0989 Other specified symptoms and signs involving the circulatory and respiratory systems: Secondary | ICD-10-CM | POA: Diagnosis not present

## 2012-07-06 DIAGNOSIS — I252 Old myocardial infarction: Secondary | ICD-10-CM | POA: Diagnosis not present

## 2012-07-06 DIAGNOSIS — Z7901 Long term (current) use of anticoagulants: Secondary | ICD-10-CM | POA: Diagnosis not present

## 2012-07-06 DIAGNOSIS — I4891 Unspecified atrial fibrillation: Secondary | ICD-10-CM | POA: Diagnosis not present

## 2012-07-06 DIAGNOSIS — I2510 Atherosclerotic heart disease of native coronary artery without angina pectoris: Secondary | ICD-10-CM | POA: Diagnosis not present

## 2012-07-06 DIAGNOSIS — I480 Paroxysmal atrial fibrillation: Secondary | ICD-10-CM

## 2012-07-06 DIAGNOSIS — E7800 Pure hypercholesterolemia, unspecified: Secondary | ICD-10-CM | POA: Diagnosis not present

## 2012-07-06 DIAGNOSIS — Z7902 Long term (current) use of antithrombotics/antiplatelets: Secondary | ICD-10-CM | POA: Insufficient documentation

## 2012-07-06 DIAGNOSIS — Z79899 Other long term (current) drug therapy: Secondary | ICD-10-CM | POA: Insufficient documentation

## 2012-07-06 HISTORY — DX: Unspecified atrial fibrillation: I48.91

## 2012-07-06 LAB — CBC
Hemoglobin: 15.8 g/dL (ref 13.0–17.0)
RBC: 4.78 MIL/uL (ref 4.22–5.81)

## 2012-07-06 LAB — BASIC METABOLIC PANEL
CO2: 23 mEq/L (ref 19–32)
GFR calc non Af Amer: 77 mL/min — ABNORMAL LOW (ref >90)
Glucose, Bld: 122 mg/dL — ABNORMAL HIGH (ref 70–99)
Potassium: 3.3 mEq/L — ABNORMAL LOW (ref 3.5–5.1)
Sodium: 140 mEq/L (ref 135–145)

## 2012-07-06 MED ORDER — NITROGLYCERIN 0.4 MG SL SUBL: 0.4000 mg | SUBLINGUAL_TABLET | SUBLINGUAL | Status: DC | PRN

## 2012-07-06 NOTE — ED Notes (Addendum)
Patient with non radiating chest pain, unable to describe the pain.  Patient denies any shortness of breath.  Patient does have afib history.  Pain was left chest.  Patient given 324mg  ASA enroute to ED.

## 2012-07-06 NOTE — ED Provider Notes (Signed)
History     CSN: [id number]  Arrival date & time 07/06/12  2146   First MD Initiated Contact with Patient 07/06/12 2208      Chief Complaint  Patient presents with  . Chest Pain    (Consider location/radiation/quality/duration/timing/severity/associated sxs/prior treatment) Patient is a 79 y.o. male presenting with chest pain. The history is provided by the patient.  Chest Pain The chest pain began 1 - 2 hours ago. Duration of episode(s) is 10 minutes. Chest pain occurs constantly. The chest pain is improving. Associated with: at rest. At its most intense, the pain is at 8/10. The pain is currently at 1/10. The severity of the pain is mild. The quality of the pain is described as aching. The pain radiates to the left arm. Pertinent negatives for primary symptoms include no fever, no fatigue, no shortness of breath, no wheezing, no palpitations, no abdominal pain, no nausea and no vomiting.  Associated symptoms include diaphoresis.  Pertinent negatives for associated symptoms include no weakness. He tried nothing for the symptoms. Risk factors include male gender.  His past medical history is significant for CAD, hyperlipidemia and hypertension. Procedure history comments: CABG.     Past Medical History  Diagnosis Date  . Tinnitus   . Elevated PSA   . HLD (hyperlipidemia)   . Kidney stone   . Diverticulosis   . Gout   . Adenomatous polyp   . Myocardial infarction     silent- 20 yrs ago  . HTN (hypertension)   . Atrial fibrillation   . CAD (coronary artery disease)     LOV DR STUCKY  with EKG 07/22/11 EPIC/  note regarding anticoagulants per Dr Riley Kill on chart,  last stress test 2009, eccho and chest x ray 3/12 EPIC, PFT 7/12 EPIC,  . Bladder cancer 07/31/2011    Past Surgical History  Procedure Date  . Coronary artery bypass graft 1993  . Orif acetabular fracture 2008    trimalleolar fracture  . Cardiovascular stress test 02/10/08    6 mets, poor tolerance, no chest pain     . Colonoscopy   . Cardiac catheterization     total occlusion to LAD, LCX, RCA. LIMA patent, normal seq SVG to OM, severe stenosis in SVG to PDA, but collaterals to PLA from OM. RCA went to PDA. treated medically   . Transurethral resection of bladder tumor 07/31/2011    Procedure: TRANSURETHRAL RESECTION OF BLADDER TUMOR (TURBT);  Surgeon: Garnett Farm, MD;  Location: [facility];  Service: Urology;  Laterality: N/A;  with Gyrus    Family History  Problem Relation Age of Onset  . Heart attack Father   . Heart attack Mother   . Heart attack Brother     History  Substance Use Topics  . Smoking status: Never Smoker   . Smokeless tobacco: Never Used  . Alcohol Use: No      Review of Systems  Constitutional: Positive for diaphoresis. Negative for fever and fatigue.  HENT: Negative for congestion, rhinorrhea and postnasal drip.   Eyes: Negative for photophobia and visual disturbance.  Respiratory: Negative for chest tightness, shortness of breath and wheezing.   Cardiovascular: Positive for chest pain. Negative for palpitations and leg swelling.  Gastrointestinal: Negative for nausea, vomiting, abdominal pain and diarrhea.  Genitourinary: Negative for urgency, frequency and difficulty urinating.  Musculoskeletal: Negative for back pain and arthralgias.  Skin: Negative for rash and wound.  Neurological: Negative for weakness and headaches.  Psychiatric/Behavioral: Negative for confusion and  agitation.    Allergies  Review of patient's allergies indicates no known allergies.  Home Medications   Current Outpatient Rx  Name  Route  Sig  Dispense  Refill  . ACETAMINOPHEN 500 MG PO TABS   Oral   Take 500 mg by mouth every 6 (six) hours as needed. For pain         . AMIODARONE HCL 200 MG PO TABS      TAKE ONE TABLET EVERY DAY   30 tablet   9   . AMLODIPINE BESYLATE 5 MG PO TABS      TAKE ONE TABLET EVERY DAY   90 tablet   3   . ASPIRIN 81 MG PO TBEC   Oral   Take 81  mg by mouth daily. Swallow whole.         Marland Kitchen CALCIUM CARBONATE ANTACID 500 MG PO CHEW   Oral   Chew 2 tablets by mouth daily.         Marland Kitchen VITAMIN D PO   Oral   Take 1 tablet by mouth daily.         . METHYLCELLULOSE (LAXATIVE) 500 MG PO TABS   Oral   Take 2 tablets by mouth 2 (two) times daily.          Marland Kitchen [name] 150 MG PO CAPS      TAKE ONE CAPSULE BY MOUTH EVERY TWELVE HOURS   60 capsule   11   . ROSUVASTATIN CALCIUM 20 MG PO TABS   Oral   Take 20 mg by mouth daily.            BP 167/76  Pulse 53  Temp 97.7 F (36.5 C) (Oral)  Resp 21  SpO2 90%  Physical Exam  Nursing note and vitals reviewed. Constitutional: He is oriented to person, place, and time. He appears well-developed and well-nourished. No distress.  HENT:  Head: Normocephalic and atraumatic.  Mouth/Throat: Oropharynx is clear and moist.  Eyes: EOM are normal. Pupils are equal, round, and reactive to light.  Neck: Normal range of motion. Neck supple.  Cardiovascular: Normal rate, regular rhythm, normal heart sounds and intact distal pulses.   Pulmonary/Chest: Effort normal and breath sounds normal. He has no wheezes. He has no rales.  Abdominal: Soft. Bowel sounds are normal. He exhibits no distension. There is no tenderness. There is no rebound and no guarding.  Musculoskeletal: Normal range of motion. He exhibits no edema and no tenderness.  Lymphadenopathy:    He has no cervical adenopathy.  Neurological: He is alert and oriented to person, place, and time. He displays normal reflexes. No cranial nerve deficit. He exhibits normal muscle tone. Coordination normal.  Skin: Skin is warm and dry. No rash noted.  Psychiatric: He has a normal mood and affect. His behavior is normal.    ED Course  Procedures (including critical care time)   Date: 07/07/2012  Rate: 86  Rhythm: normal sinus rhythm  QRS Axis: right  Intervals: QT prolonged and widened QRS  ST/T Wave abnormalities: nonspecific ST  changes  Conduction Disutrbances:right bundle branch block  Narrative Interpretation:   Old EKG Reviewed: unchanged, baseline artifact    Labs Reviewed  CBC - Abnormal; Notable for the following:    WBC 11.7 (*)     All other components within normal limits  BASIC METABOLIC PANEL - Abnormal; Notable for the following:    Potassium 3.3 (*)     Glucose, Bld 122 (*)  GFR calc non Af Amer 77 (*)     GFR calc Af Amer 89 (*)     All other components within normal limits  TROPONIN I   Dg Chest Port 1 View  07/06/2012  *RADIOLOGY REPORT*  Clinical Data: Sudden onset of left-sided chest pain.  PORTABLE CHEST - 1 VIEW  Comparison: Chest radiograph performed 08/22/2010  Findings: The lungs are well-aerated.  Vascular congestion is noted, with mild bilateral streaky opacities likely reflecting atelectasis.  There is no evidence of pleural effusion or pneumothorax.  The cardiomediastinal silhouette is borderline enlarged; the patient is status post median sternotomy.  There is a chronic fracture of the superiormost sternal wire.  No acute osseous abnormalities are seen.  IMPRESSION: Vascular congestion noted, with mild bilateral streaky airspace opacities likely reflecting atelectasis.   Original Report Authenticated By: Tonia Ghent, M.D.      1. Chest pain       MDM  [phone] with pmhx of CAD s/p CABG (1990's), atrial fibrillation on [name], HTN, HLD here with an episode of chest pain about 1 hour to arrival. Described as dullness, left-sided, and radiating down left arm. Had some associated "warmth." Followed by Dr. Tedra Senegal. Last stress about 5 years ago. Pain now 1/10. Not given nitro but given full dose ASA. CXR with some vascular congestion. Negative troponin. Concern for ACS vs URI/infectious. Doubt PE, dissection, pericarditis. Considering high risk history for CAD, will admit to cardiology for an ACS rule out.        Johnnette Gourd, MD 07/07/12 [phone]

## 2012-07-06 NOTE — H&P (Addendum)
CARDIOLOGY ADMISSION NOTE  Patient ID: Evan Dorsey MRN: [id number] DOB/AGE: 77-Nov-1934 77 y.o.  Admit date: 07/06/2012 Primary Physician   Londell Moh, MD Primary Cardiologist   Dr. Riley Kill Chief Complaint    Chest pain  HPI:  The patient presented after an episode of chest discomfort. He been doing well and in his usual state of health. While sitting in a chair watching did do basketball game he developed moderate discomfort. He was over his left side. It was somewhat sharp. He never had this kind of pain before. He felt warm all over. He did not report diaphoresis. He did not have nausea or vomiting. He's not short of breath. He didn't feel any palpitations and has had no presyncope or syncope. He's had no weight gain or edema. 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 He does walk occasionally for exercise. He's had no symptoms with this.  He called EMS. In the emergency room he was found to have first point-of-care troponin normal. EKG was a chronic right bundle branch block with no acute changes. His pain had resolved spontaneously over about one half hour.  The patient has a history of CAD with distant CABG.  I see the report of a cath below and mention in the chart that this was done in 2005.  I do not see the cath report.  I see a mention of the last stress test in 2009.     Past Medical History  Diagnosis Date  . Tinnitus   . Elevated PSA   . HLD (hyperlipidemia)   . Kidney stone   . Diverticulosis   . Gout   . Adenomatous polyp   . Myocardial infarction     silent- 20 yrs ago  . Shortness of breath     pre bypass  . HTN (hypertension)   . Dysrhythmia     atrial fibrilliation/ pradaxa. Per office aware of stopping  [name]  7 days and aspirin 5 days, note on chart  . CAD (coronary artery disease)     LOV DR STUCKY  with EKG 07/22/11 [facility]/  note regarding anticoagulants per Dr Riley Kill on chart,  last stress test 2009, eccho and chest x ray  3/12 [facility], PFT 7/12 [facility],  . Bladder cancer 07/31/2011    Past Surgical History  Procedure Date  . Coronary artery bypass graft 1993  . Orif acetabular fracture 2008    trimalleolar fracture  . Cardiovascular stress test 02/10/08    6 mets, poor tolerance, no chest pain   . Colonoscopy   . Cardiac catheterization     total occlusion to LAD, LCX, RCA. LIMA patent, normal seq SVG to OM, severe stenosis in SVG to PDA, but collaterals to PLA from OM. RCA went to PDA. treated medically   . Transurethral resection of bladder tumor 07/31/2011    Procedure: TRANSURETHRAL RESECTION OF BLADDER TUMOR (TURBT);  Surgeon: Garnett Farm, MD;  Location: [facility];  Service: Urology;  Laterality: N/A;  with Gyrus    No Known Allergies No current facility-administered medications on file prior to encounter.   Current Outpatient Prescriptions on File Prior to Encounter  Medication Sig Dispense Refill  . acetaminophen (TYLENOL) 500 MG tablet Take 500 mg by mouth every 6 (six) hours as needed. For pain      . amiodarone (PACERONE) 200 MG tablet TAKE ONE TABLET EVERY DAY  30 tablet  9  . amLODipine (NORVASC) 5 MG tablet TAKE ONE TABLET  EVERY DAY  90 tablet  3  . aspirin (ECOTRIN LOW STRENGTH) 81 MG EC tablet Take 81 mg by mouth daily. Swallow whole.      . calcium carbonate (TUMS - DOSED IN MG ELEMENTAL CALCIUM) 500 MG chewable tablet Chew 2 tablets by mouth daily.      . Methylcellulose, Laxative, 500 MG TABS Take 2 tablets by mouth 2 (two) times daily.       Marland Kitchen [name] 150 MG CAPS TAKE ONE CAPSULE BY MOUTH EVERY TWELVE HOURS  60 capsule  11  . rosuvastatin (CRESTOR) 20 MG tablet Take 20 mg by mouth daily.        History   Social History  . Marital Status: Married    Spouse Name: N/A    Number of Children: N/A  . Years of Education: N/A   Occupational History  . retired Visual merchandiser    Social History Main Topics  . Smoking status: Never Smoker   . Smokeless tobacco: Never Used  . Alcohol Use: No  . Drug  Use: No  . Sexually Active: Not on file   Other Topics Concern  . Not on file   Social History Narrative  . No narrative on file    Family History  Problem Relation Age of Onset  . Heart attack Father   . Heart attack Mother   . Heart attack Brother     ROS:  As stated in the HPI and negative for all other systems.  Physical Exam: Blood pressure 167/76, pulse 53, temperature 97.7 F (36.5 C), temperature source Oral, resp. rate 21, SpO2 90.00%.  GENERAL:  Well appearing HEENT:  Pupils equal round and reactive, fundi not visualized, oral mucosa unremarkable, upper denture NECK:  No jugular venous distention, waveform within normal limits, carotid upstroke brisk and symmetric, no bruits, no thyromegaly LYMPHATICS:  No cervical, inguinal adenopathy LUNGS:  Clear to auscultation bilaterally BACK:  No CVA tenderness CHEST: Well healed sternotomy scar. HEART:  PMI not displaced or sustained,S1 and S2 within normal limits, no S3, no S4, no clicks, no rubs, no murmurs ABD:  Flat, positive bowel sounds normal in frequency in pitch, no bruits, no rebound, no guarding, no midline pulsatile mass, no hepatomegaly, no splenomegaly EXT:  2 plus pulses throughout, trace bilateral lower extremity edema, no cyanosis no clubbing SKIN:  No rashes no nodules NEURO:  Cranial nerves II through XII grossly intact, motor grossly intact throughout PSYCH:  Cognitively intact, oriented to person place and time  Labs: Lab Results  Component Value Date   BUN 13 07/06/2012   Lab Results  Component Value Date   CREATININE 0.99 07/06/2012   Lab Results  Component Value Date   NA 140 07/06/2012   K 3.3* 07/06/2012   CL 104 07/06/2012   CO2 23 07/06/2012   Lab Results  Component Value Date   TROPONINI <0.30 07/06/2012   Lab Results  Component Value Date   WBC 11.7* 07/06/2012   HGB 15.8 07/06/2012   HCT 45.7 07/06/2012   MCV 95.6 07/06/2012   PLT 241 07/06/2012     Radiology:  CXR:  Vascular  congestion noted, with mild bilateral streaky airspace  opacities likely reflecting atelectasis.   EKG:  NSR, rate 86, RBBB.  Compared with the ECG of 04/2012 the rate is faster but no other significant change.   ASSESSMENT AND PLAN:    Chest pain: The patient had one episode of chest discomfort. At this point I think we should observe  him overnight. I will cycle cardiac enzymes. If he has no further pain and no objective evidence of ischemia I think outpatient stress perfusion imaging would be reasonable. This may be somewhat difficult to interpret but could be done so in the context of his known coronary disease.  Atrial fibrillation He's tolerating anticoagulation. He seems to be maintaining sinus rhythm. He will continue the meds as listed.  CAD As above.  I will continue the ASA and defer to Dr. Riley Kill.  He is on [name] as well.  There is some suggestion of increased anticoagulant effect as will with [name] and amiodarone.  Hypokalemia This will be supplemented.  HTN His blood pressure is slightly elevated. He will continue the meds as listed.  Hyperlipidemia He is on target dose of statin per current guidelines. I will continue this.  SignedRollene Rotunda 07/06/2012, 11:47 PM

## 2012-07-06 NOTE — ED Provider Notes (Signed)
79 year old male status post coronary artery bypass had onset this evening of 80 and a dull left-sided chest pain without radiation. He felt hot all over but denies dyspnea or nausea. She has not taken anything for pain but it has subsided greatly. He came in by ambulance and received aspirin. He states that he has not had a stress test in several years. Last stress test was scheduled in 2012 but cannot be done because he was in atrial fibrillation. On exam, lungs are clear and heart has regular rate and rhythm. He has 2+ pitting pretibial edema which he says is not normal for him. At the very least, he will need to have cardiac markers cycled.   Date: 07/06/2012  Rate: 86  Rhythm: normal sinus rhythm  QRS Axis: right  Intervals: normal  ST/T Wave abnormalities: normal  Conduction Disutrbances:right bundle branch block and left posterior fascicular block  Narrative Interpretation: Right bundle branch block, probable left posterior fascicular block. When compared with ECG of 08/25/2010, no significant change is seen although axis has shifted rightward.  Old EKG Reviewed: unchanged  I saw and evaluated the patient, reviewed the resident's note and I agree with the findings and plan.    Dione Booze, MD 07/06/12 [phone]

## 2012-07-07 ENCOUNTER — Encounter (HOSPITAL_COMMUNITY): Payer: Self-pay | Admitting: Cardiology

## 2012-07-07 DIAGNOSIS — R079 Chest pain, unspecified: Secondary | ICD-10-CM

## 2012-07-07 DIAGNOSIS — R072 Precordial pain: Principal | ICD-10-CM | POA: Diagnosis present

## 2012-07-07 LAB — BASIC METABOLIC PANEL
Calcium: 9.2 mg/dL (ref 8.4–10.5)
Creatinine, Ser: 0.77 mg/dL (ref 0.50–1.35)
GFR calc non Af Amer: 85 mL/min — ABNORMAL LOW (ref >90)
Sodium: 144 mEq/L (ref 135–145)

## 2012-07-07 LAB — CBC
MCH: 31.6 pg (ref 26.0–34.0)
MCHC: 33 g/dL (ref 30.0–36.0)
MCV: 95.6 fL (ref 78.0–100.0)
Platelets: 238 10*3/uL (ref 150–400)

## 2012-07-07 LAB — TROPONIN I
Troponin I: <0.3 ng/mL (ref <0.30)
Troponin I: <0.3 ng/mL (ref <0.30)
Troponin I: <0.3 ng/mL (ref <0.30)

## 2012-07-07 LAB — TSH: TSH: 3.139 u[IU]/mL (ref 0.350–4.500)

## 2012-07-07 MED ORDER — DABIGATRAN ETEXILATE MESYLATE 150 MG PO CAPS
150.0000 mg | ORAL_CAPSULE | Freq: Two times a day (BID) | ORAL | Status: DC
  Administered 2012-07-07 – 2012-07-08 (×3): 150 mg via ORAL
  Filled 2012-07-07 (×5): qty 1

## 2012-07-07 MED ORDER — ASPIRIN 81 MG PO TBEC: 81.0000 mg | DELAYED_RELEASE_TABLET | Freq: Every day | ORAL | Status: DC

## 2012-07-07 MED ORDER — SODIUM CHLORIDE 0.9 % IJ SOLN
3.0000 mL | Freq: Two times a day (BID) | INTRAMUSCULAR | Status: DC
  Administered 2012-07-07 – 2012-07-08 (×3): 3 mL via INTRAVENOUS

## 2012-07-07 MED ORDER — ACETAMINOPHEN 500 MG PO TABS: 500.0000 mg | ORAL_TABLET | Freq: Four times a day (QID) | ORAL | Status: DC | PRN

## 2012-07-07 MED ORDER — ATORVASTATIN CALCIUM 40 MG PO TABS
40.0000 mg | ORAL_TABLET | Freq: Every day | ORAL | Status: DC
  Administered 2012-07-07 – 2012-07-08 (×2): 40 mg via ORAL
  Filled 2012-07-07 (×2): qty 1

## 2012-07-07 MED ORDER — AMLODIPINE BESYLATE 5 MG PO TABS
5.0000 mg | ORAL_TABLET | Freq: Every day | ORAL | Status: DC
  Administered 2012-07-07 – 2012-07-08 (×2): 5 mg via ORAL
  Filled 2012-07-07 (×2): qty 1

## 2012-07-07 MED ORDER — ASPIRIN EC 81 MG PO TBEC
81.0000 mg | DELAYED_RELEASE_TABLET | Freq: Every day | ORAL | Status: DC
  Administered 2012-07-08: 81 mg via ORAL
  Filled 2012-07-07: qty 1

## 2012-07-07 MED ORDER — AMIODARONE HCL 200 MG PO TABS
200.0000 mg | ORAL_TABLET | Freq: Every day | ORAL | Status: DC
  Administered 2012-07-07 – 2012-07-08 (×2): 200 mg via ORAL
  Filled 2012-07-07 (×2): qty 1

## 2012-07-07 MED ORDER — CALCIUM CARBONATE ANTACID 500 MG PO CHEW
2.0000 | CHEWABLE_TABLET | Freq: Every day | ORAL | Status: DC
  Administered 2012-07-07 – 2012-07-08 (×2): 400 mg via ORAL
  Filled 2012-07-07 (×2): qty 2

## 2012-07-07 MED ORDER — POTASSIUM CHLORIDE CRYS ER 20 MEQ PO TBCR
40.0000 meq | EXTENDED_RELEASE_TABLET | Freq: Once | ORAL | Status: AC
  Administered 2012-07-07: 40 meq via ORAL
  Filled 2012-07-07: qty 2

## 2012-07-07 MED ORDER — DABIGATRAN ETEXILATE MESYLATE 150 MG PO CAPS
150.0000 mg | ORAL_CAPSULE | Freq: Two times a day (BID) | ORAL | Status: DC
  Filled 2012-07-07 (×3): qty 1

## 2012-07-07 MED ORDER — POTASSIUM CHLORIDE CRYS ER 20 MEQ PO TBCR
20.0000 meq | EXTENDED_RELEASE_TABLET | Freq: Once | ORAL | Status: AC
  Administered 2012-07-07: 20 meq via ORAL
  Filled 2012-07-07: qty 1

## 2012-07-07 MED ORDER — ONDANSETRON HCL 4 MG/2ML IJ SOLN: 4.0000 mg | Freq: Four times a day (QID) | INTRAMUSCULAR | Status: DC | PRN

## 2012-07-07 MED ORDER — NITROGLYCERIN 0.4 MG SL SUBL: 0.4000 mg | SUBLINGUAL_TABLET | SUBLINGUAL | Status: DC | PRN

## 2012-07-07 MED ORDER — SODIUM CHLORIDE 0.9 % IJ SOLN: 3.0000 mL | INTRAMUSCULAR | Status: DC | PRN

## 2012-07-07 MED ORDER — ACETAMINOPHEN 325 MG PO TABS
650.0000 mg | ORAL_TABLET | ORAL | Status: DC | PRN
  Administered 2012-07-07: 650 mg via ORAL
  Filled 2012-07-07 (×2): qty 1

## 2012-07-07 MED ORDER — CALCIUM POLYCARBOPHIL 625 MG PO TABS
625.0000 mg | ORAL_TABLET | Freq: Two times a day (BID) | ORAL | Status: DC
  Administered 2012-07-07 – 2012-07-08 (×3): 625 mg via ORAL
  Filled 2012-07-07 (×4): qty 1

## 2012-07-07 MED ORDER — REGADENOSON 0.4 MG/5ML IV SOLN
0.4000 mg | Freq: Once | INTRAVENOUS | Status: AC
  Administered 2012-07-08: 0.4 mg via INTRAVENOUS
  Filled 2012-07-07: qty 5

## 2012-07-07 MED ORDER — SODIUM CHLORIDE 0.9 % IV SOLN: 250.0000 mL | INTRAVENOUS | Status: DC | PRN

## 2012-07-07 NOTE — Progress Notes (Signed)
Patient Name: Evan Dorsey Date of Encounter: 07/07/2012     Principal Problem:  *Precordial pain    SUBJECTIVE  Much better.  He thinks symptoms were likely from splitting wood over the weekend.  His shoulder was worse.  Yesterday symptoms worse with inspiration.    CURRENT MEDS    . amiodarone  200 mg Oral Daily  . amLODipine  5 mg Oral Daily  . aspirin EC  81 mg Oral Daily  . atorvastatin  40 mg Oral q1800  . calcium carbonate  2 tablet Oral Daily  . dabigatran  150 mg Oral Q12H  . polycarbophil  625 mg Oral BID  . sodium chloride  3 mL Intravenous Q12H    OBJECTIVE  Filed Vitals:   07/07/12 0100 07/07/12 0130 07/07/12 0225 07/07/12 0644  BP: 149/70 149/72 181/82 135/79  Pulse: 70 70 74 68  Temp:   98.1 F (36.7 C) 97.8 F (36.6 C)  TempSrc:   Oral Oral  Resp: 18 19 16 16   Height:   6' (1.829 m)   Weight:   217 lb 14.4 oz (98.839 kg)   SpO2: 93% 93% 96% 95%   No intake or output data in the 24 hours ending 07/07/12 0912 Filed Weights   07/07/12 0225  Weight: 217 lb 14.4 oz (98.839 kg)    PHYSICAL EXAM  General: Pleasant, NAD. Neuro: Alert and oriented X 3. Moves all extremities spontaneously. Psych: Normal affect. HEENT:  Normal  Neck: Supple without bruits or JVD. Lungs:  Resp regular and unlabored, CTA. Heart: RRR no s3, s4, or murmurs. Abdomen: Soft, non-tender, non-distended, BS + x 4.  Extremities: No clubbing, cyanosis or edema. DP/PT/Radials 2+ and equal bilaterally.  Accessory Clinical Findings  CBC  Basename 07/07/12 0730 07/06/12 2202  WBC 10.1 11.7*  NEUTROABS -- --  HGB 14.3 15.8  HCT 43.3 45.7  MCV 95.6 95.6  PLT 238 241   Basic Metabolic Panel  Basename 07/07/12 0730 07/06/12 2202  NA 144 140  K 3.1* 3.3*  CL 107 104  CO2 24 23  GLUCOSE 100* 122*  BUN 10 13  CREATININE 0.77 0.99  CALCIUM 9.2 9.3  MG 2.1 --  PHOS -- --   Liver Function Tests No results found for this basename:  AST:2,ALT:2,ALKPHOS:2,BILITOT:2,PROT:2,ALBUMIN:2 in the last 72 hours No results found for this basename: LIPASE:2,AMYLASE:2 in the last 72 hours Cardiac Enzymes  Basename 07/07/12 0730 07/07/12 0110 07/06/12 2203  CKTOTAL -- -- --  CKMB -- -- --  CKMBINDEX -- -- --  TROPONINI <0.30 <0.30 <0.30   BNP No components found with this basename: POCBNP:3 D-Dimer No results found for this basename: DDIMER:2 in the last 72 hours Hemoglobin A1C No results found for this basename: HGBA1C in the last 72 hours Fasting Lipid Panel No results found for this basename: CHOL,HDL,LDLCALC,TRIG,CHOLHDL,LDLDIRECT in the last 72 hours Thyroid Function Tests No results found for this basename: TSH,T4TOTAL,FREET3,T3FREE,THYROIDAB in the last 72 hours    ECG  NSR.  RBBB.  No acute changes.    Radiology/Studies  Dg Chest Port 1 View  07/06/2012  *RADIOLOGY REPORT*  Clinical Data: Sudden onset of left-sided chest pain.  PORTABLE CHEST - 1 VIEW  Comparison: Chest radiograph performed 08/22/2010  Findings: The lungs are well-aerated.  Vascular congestion is noted, with mild bilateral streaky opacities likely reflecting atelectasis.  There is no evidence of pleural effusion or pneumothorax.  The cardiomediastinal silhouette is borderline enlarged; the patient is status post median sternotomy.  There is a chronic fracture of the superiormost sternal wire.  No acute osseous abnormalities are seen.  IMPRESSION: Vascular congestion noted, with mild bilateral streaky airspace opacities likely reflecting atelectasis.   Original Report Authenticated By: Tonia Ghent, M.D.     ASSESSMENT AND PLAN  1.  Atypical chest pain 2.  Hypokalemia 3.  History of arrhythmia  Plan   1.  Ambulate 2,  Replace K 3.  Prob home today with OP stress myoview.    Signed, Shawnie Pons MD, [facility], FSCAI

## 2012-07-07 NOTE — Plan of Care (Signed)
Problem: Phase II Progression Outcomes Goal: Stress Test if indicated Outcome: Completed/Met Date Met:  07/07/12 In am

## 2012-07-07 NOTE — ED Notes (Signed)
Attempted to call report. Floor RN unable to accept report.  

## 2012-07-07 NOTE — Progress Notes (Signed)
UR Completed Georg Ang Graves-Bigelow, RN,BSN [phone]  

## 2012-07-07 NOTE — Care Management Note (Signed)
    Page 1 of 1   07/07/2012     10:46:51 AM   CARE MANAGEMENT NOTE 07/07/2012  Patient:  Evan Dorsey, Evan Dorsey   Account Number:  [id number]  Date Initiated:  07/07/2012  Documentation initiated by:  GRAVES-BIGELOW,Nevae Pinnix  Subjective/Objective Assessment:   Pt admitted with cp. Per MD notes plan for outpatient myoview. Possible d/c today.     Action/Plan:   No needs from CM at this time.   Anticipated DC Date:  07/07/2012   Anticipated DC Plan:  HOME/SELF CARE      DC Planning Services  CM consult      Choice offered to / List presented to:             Status of service:  Completed, signed off Medicare Important Message given?   (If response is "NO", the following Medicare IM given date fields will be blank) Date Medicare IM given:   Date Additional Medicare IM given:    Discharge Disposition:  HOME/SELF CARE  Per UR Regulation:  Reviewed for med. necessity/level of care/duration of stay  If discussed at Long Length of Stay Meetings, dates discussed:    Comments:

## 2012-07-08 ENCOUNTER — Observation Stay (HOSPITAL_COMMUNITY): Payer: Medicare Other

## 2012-07-08 DIAGNOSIS — E785 Hyperlipidemia, unspecified: Secondary | ICD-10-CM | POA: Diagnosis not present

## 2012-07-08 DIAGNOSIS — I2510 Atherosclerotic heart disease of native coronary artery without angina pectoris: Secondary | ICD-10-CM

## 2012-07-08 DIAGNOSIS — R079 Chest pain, unspecified: Secondary | ICD-10-CM

## 2012-07-08 DIAGNOSIS — R072 Precordial pain: Secondary | ICD-10-CM | POA: Diagnosis not present

## 2012-07-08 DIAGNOSIS — I4891 Unspecified atrial fibrillation: Secondary | ICD-10-CM | POA: Diagnosis not present

## 2012-07-08 DIAGNOSIS — I10 Essential (primary) hypertension: Secondary | ICD-10-CM | POA: Diagnosis not present

## 2012-07-08 LAB — BASIC METABOLIC PANEL
BUN: 10 mg/dL (ref 6–23)
Calcium: 9.7 mg/dL (ref 8.4–10.5)
GFR calc Af Amer: >90 mL/min (ref >90)
GFR calc non Af Amer: 82 mL/min — ABNORMAL LOW (ref >90)
Glucose, Bld: 94 mg/dL (ref 70–99)
Potassium: 4.2 mEq/L (ref 3.5–5.1)
Sodium: 141 mEq/L (ref 135–145)

## 2012-07-08 MED ORDER — TECHNETIUM TC 99M SESTAMIBI GENERIC - CARDIOLITE
30.0000 | Freq: Once | INTRAVENOUS | Status: AC | PRN
  Administered 2012-07-08: 30 via INTRAVENOUS

## 2012-07-08 MED ORDER — NITROGLYCERIN 0.4 MG SL SUBL: 0.4000 mg | SUBLINGUAL_TABLET | SUBLINGUAL | Status: DC | PRN

## 2012-07-08 MED ORDER — TECHNETIUM TC 99M SESTAMIBI GENERIC - CARDIOLITE
10.0000 | Freq: Once | INTRAVENOUS | Status: AC | PRN
  Administered 2012-07-08: 10 via INTRAVENOUS

## 2012-07-08 NOTE — Discharge Summary (Signed)
Discharge Summary   Patient ID: Evan Dorsey,  MRN: [id number], DOB/AGE: 10/01/1932 79 y.o.  Admit date: 07/06/2012 Discharge date: 07/09/2012  Primary Physician: Londell Moh, MD Primary Cardiologist: Bonnee Quin, MD  Discharge Diagnoses Principal Problem:  *Precordial pain Active Problems:  HYPERLIPIDEMIA-MIXED  HYPERTENSION, BENIGN  CORONARY ATHEROSCLEROSIS NATIVE CORONARY ARTERY  CAD, ARTERY BYPASS GRAFT  Paroxysmal atrial fibrillation  Hypokalemia   Allergies No Known Allergies  Diagnostic Studies/Procedures  PORTABLE CHEST X-RAY - 07/06/12  IMPRESSION:  Vascular congestion noted, with mild bilateral streaky airspace  opacities likely reflecting atelectasis.  LEXISCAN MYOVIEW - 07/08/12  IMPRESSION:  No evidence of ischemia or infarction. Ejection fraction 53%.  History of Present Illness/Hospital Course  Mr. Evan Dorsey is a 79yo male who was admitted to [facility] on 07/06/12 with the above problem list. He has a history of CAD with distant CABG. He has a history of PAF (on [name]). He reported experiencing left-sided sharp chest pain the day of admission with associated flushing and diaphoresis. He denied prior exertional chest discomfort. He denied further associated symptoms. He presented to [facility] ED where an initial trop-I and EKG revealed no evidence of ischemia. A mild hypokalemia was noted. CXR did indicate vascular congestion with mild bilateral streaky airspace opacities likely reflecting atelectasis. Although his symptoms were somewhat atypical, given his cardiac history, he was observed overnight with planned stress testing the following day. Three subsequent troponins returned WNL. Potassium was repleted. TSH returned WNL. As above, a Lexiscan Myoview was performed revealing no evidence of ischemia. Dr. Riley Kill deemed him to be appropriate for discharge. He will resume all prior outpatient medications. The office will call the patient  with an appointment date and time. This information has been clearly outlined in the discharge AVS.  Discharge Vitals:  Blood pressure 114/70, pulse 60, temperature 97.6 F (36.4 C), temperature source Oral, resp. rate 18, height 6' (1.829 m), weight 98.6 kg (217 lb 6 oz), SpO2 96.00%.   Labs: Recent Labs  Basename 07/07/12 0730 07/06/12 2202   WBC 10.1 11.7*   HGB 14.3 15.8   HCT 43.3 45.7   MCV 95.6 95.6   PLT 238 241    Lab 07/08/12 1002 07/07/12 0730 07/06/12 2202  NA 141 144 140  K 4.2 3.1* 3.3*  CL 107 107 104  CO2 21 24 23   BUN 10 10 13   CREATININE 0.86 0.77 0.99  CALCIUM 9.7 9.2 9.3  PROT -- -- --  BILITOT -- -- --  ALKPHOS -- -- --  ALT -- -- --  AST -- -- --  AMYLASE -- -- --  LIPASE -- -- --  GLUCOSE 94 100* 122*   Recent Labs  Basename 07/07/12 1237 07/07/12 0730 07/07/12 0110   CKTOTAL -- -- --   CKMB -- -- --   CKMBINDEX -- -- --   TROPONINI <0.30 <0.30 <0.30    Basename 07/07/12 0730  TSH 3.139  T4TOTAL --  T3FREE --  THYROIDAB --    Disposition:  Discharge Orders    Future Appointments: Provider: Department: Dept Phone: Center:   08/18/2012 1:45 PM Herby Abraham, MD [facility] Main Office [place]) [phone] [facility]     Future Orders Please Complete By Expires   Diet - low sodium heart healthy      Increase activity slowly        Follow-up Information    Follow up with [facility]. (Office will call with an appointment date and time. )  Contact information:   [address] [place] [address]          Discharge Medications:    Medication List     As of 07/09/2012  9:35 AM    START taking these medications         nitroGLYCERIN 0.4 MG SL tablet   Commonly known as: NITROSTAT   Place 1 tablet (0.4 mg total) under the tongue every 5 (five) minutes x 3 doses as needed for chest pain.      CONTINUE taking these medications         acetaminophen 500 MG tablet   Commonly known as:  TYLENOL      amiodarone 200 MG tablet   Commonly known as: PACERONE   TAKE ONE TABLET EVERY DAY      amLODipine 5 MG tablet   Commonly known as: NORVASC   TAKE ONE TABLET EVERY DAY      calcium carbonate 500 MG chewable tablet   Commonly known as: TUMS - dosed in mg elemental calcium      ECOTRIN LOW STRENGTH 81 MG EC tablet   Generic drug: aspirin      Methylcellulose (Laxative) 500 MG Tabs      [name] 150 MG Caps   Generic drug: dabigatran   TAKE ONE CAPSULE BY MOUTH EVERY TWELVE HOURS      rosuvastatin 20 MG tablet   Commonly known as: CRESTOR      VITAMIN D PO          Where to get your medications    These are the prescriptions that you need to pick up. We sent them to a specific pharmacy, so you will need to go there to get them.   [facility] - [place], [place] - [address]    [address] [place] [address]    Phone: [phone]        nitroGLYCERIN 0.4 MG SL tablet           Outstanding Labs/Studies: None  Duration of Discharge Encounter: Greater than 30 minutes including physician time.  Signed, R. Hurman Horn, PA-C 07/09/2012, 9:35 AM

## 2012-07-08 NOTE — Progress Notes (Signed)
Patient Name: Evan Dorsey Date of Encounter: 07/08/2012     Principal Problem:  *Precordial pain    SUBJECTIVE  Doing well.  No major complaints.  No further pain.  I spoke with his wife yesterday, and she painted a somewhat more worrisome picture than the patient---prominent diaphoresis with the event---precipitating an inpatients stress test primarily to assess his status.  He feels good and ambulated well this am.    CURRENT MEDS    . amiodarone  200 mg Oral Daily  . amLODipine  5 mg Oral Daily  . aspirin EC  81 mg Oral Daily  . atorvastatin  40 mg Oral q1800  . calcium carbonate  2 tablet Oral Daily  . dabigatran  150 mg Oral Q12H  . polycarbophil  625 mg Oral BID  . regadenoson  0.4 mg Intravenous Once  . sodium chloride  3 mL Intravenous Q12H    OBJECTIVE  Filed Vitals:   07/07/12 1003 07/07/12 1400 07/07/12 2040 07/08/12 0532  BP: 149/64 117/58 131/74 130/79  Pulse: 64 64 58 68  Temp:  98.5 F (36.9 C) 98.1 F (36.7 C) 98.7 F (37.1 C)  TempSrc:   Oral Oral  Resp:  18 18 18   Height:      Weight:    217 lb 6 oz (98.6 kg)  SpO2:  92% 94% 91%   No intake or output data in the 24 hours ending 07/08/12 0801 Filed Weights   07/07/12 0225 07/08/12 0532  Weight: 217 lb 14.4 oz (98.839 kg) 217 lb 6 oz (98.6 kg)    PHYSICAL EXAM  General: Pleasant, NAD. Neuro: Alert and oriented X 3. Moves all extremities spontaneously. Psych: Normal affect. HEENT:  Normal  Neck: Supple without bruits or JVD. Lungs:  Resp regular and unlabored, CTA. Heart: RRR no s3, s4, or murmurs. Extremities: No clubbing, cyanosis or edema. DP/PT/Radials 2+ and equal bilaterally.  Accessory Clinical Findings  CBC  Basename 07/07/12 0730 07/06/12 2202  WBC 10.1 11.7*  NEUTROABS -- --  HGB 14.3 15.8  HCT 43.3 45.7  MCV 95.6 95.6  PLT 238 241   Basic Metabolic Panel  Basename 07/07/12 0730 07/06/12 2202  NA 144 140  K 3.1* 3.3*  CL 107 104  CO2 24 23  GLUCOSE 100*  122*  BUN 10 13  CREATININE 0.77 0.99  CALCIUM 9.2 9.3  MG 2.1 --  PHOS -- --   Liver Function Tests No results found for this basename: AST:2,ALT:2,ALKPHOS:2,BILITOT:2,PROT:2,ALBUMIN:2 in the last 72 hours No results found for this basename: LIPASE:2,AMYLASE:2 in the last 72 hours Cardiac Enzymes  Basename 07/07/12 1237 07/07/12 0730 07/07/12 0110  CKTOTAL -- -- --  CKMB -- -- --  CKMBINDEX -- -- --  TROPONINI <0.30 <0.30 <0.30   BNP No components found with this basename: POCBNP:3 D-Dimer No results found for this basename: DDIMER:2 in the last 72 hours Hemoglobin A1C No results found for this basename: HGBA1C in the last 72 hours Fasting Lipid Panel No results found for this basename: CHOL,HDL,LDLCALC,TRIG,CHOLHDL,LDLDIRECT in the last 72 hours Thyroid Function Tests  Basename 07/07/12 0730  TSH 3.139  T4TOTAL --  T3FREE --  THYROIDAB --    TELE  NSR and SB.  No rhythm abnormalities.      Radiology/Studies  Dg Chest Port 1 View  07/06/2012  *RADIOLOGY REPORT*  Clinical Data: Sudden onset of left-sided chest pain.  PORTABLE CHEST - 1 VIEW  Comparison: Chest radiograph performed 08/22/2010  Findings: The lungs  are well-aerated.  Vascular congestion is noted, with mild bilateral streaky opacities likely reflecting atelectasis.  There is no evidence of pleural effusion or pneumothorax.  The cardiomediastinal silhouette is borderline enlarged; the patient is status post median sternotomy.  There is a chronic fracture of the superiormost sternal wire.  No acute osseous abnormalities are seen.  IMPRESSION: Vascular congestion noted, with mild bilateral streaky airspace opacities likely reflecting atelectasis.   Original Report Authenticated By: Tonia Ghent, M.D.     ASSESSMENT AND PLAN  1.  For Lexiscan today today to assess ischemia.  Will recheck K after K was given.    Signed, Shawnie Pons MD, [facility], FSCAI

## 2012-07-09 ENCOUNTER — Encounter (HOSPITAL_COMMUNITY): Payer: Self-pay | Admitting: Physician Assistant

## 2012-07-09 DIAGNOSIS — I480 Paroxysmal atrial fibrillation: Secondary | ICD-10-CM

## 2012-07-09 DIAGNOSIS — E876 Hypokalemia: Secondary | ICD-10-CM

## 2012-07-12 ENCOUNTER — Ambulatory Visit: Payer: Self-pay | Admitting: Ophthalmology

## 2012-07-12 DIAGNOSIS — I2510 Atherosclerotic heart disease of native coronary artery without angina pectoris: Secondary | ICD-10-CM | POA: Diagnosis not present

## 2012-07-12 DIAGNOSIS — H9190 Unspecified hearing loss, unspecified ear: Secondary | ICD-10-CM | POA: Diagnosis not present

## 2012-07-12 DIAGNOSIS — R002 Palpitations: Secondary | ICD-10-CM | POA: Diagnosis not present

## 2012-07-12 DIAGNOSIS — Z79899 Other long term (current) drug therapy: Secondary | ICD-10-CM | POA: Diagnosis not present

## 2012-07-12 DIAGNOSIS — H2510 Age-related nuclear cataract, unspecified eye: Secondary | ICD-10-CM | POA: Diagnosis not present

## 2012-07-12 DIAGNOSIS — M129 Arthropathy, unspecified: Secondary | ICD-10-CM | POA: Diagnosis not present

## 2012-07-12 DIAGNOSIS — Z951 Presence of aortocoronary bypass graft: Secondary | ICD-10-CM | POA: Diagnosis not present

## 2012-07-12 DIAGNOSIS — H269 Unspecified cataract: Secondary | ICD-10-CM | POA: Diagnosis not present

## 2012-07-12 DIAGNOSIS — I4891 Unspecified atrial fibrillation: Secondary | ICD-10-CM | POA: Diagnosis not present

## 2012-07-12 DIAGNOSIS — Z7982 Long term (current) use of aspirin: Secondary | ICD-10-CM | POA: Diagnosis not present

## 2012-07-19 NOTE — Discharge Summary (Signed)
See my note same day.  Ready for discharge.  TS

## 2012-07-26 ENCOUNTER — Ambulatory Visit (INDEPENDENT_AMBULATORY_CARE_PROVIDER_SITE_OTHER): Payer: Medicare Other | Admitting: Cardiology

## 2012-07-26 ENCOUNTER — Encounter: Payer: Self-pay | Admitting: Cardiology

## 2012-07-26 VITALS — BP 136/73 | HR 60 | Ht 71.0 in | Wt 227.0 lb

## 2012-07-26 DIAGNOSIS — R072 Precordial pain: Secondary | ICD-10-CM

## 2012-07-26 DIAGNOSIS — I4891 Unspecified atrial fibrillation: Secondary | ICD-10-CM | POA: Diagnosis not present

## 2012-07-26 DIAGNOSIS — I2510 Atherosclerotic heart disease of native coronary artery without angina pectoris: Secondary | ICD-10-CM | POA: Diagnosis not present

## 2012-07-26 DIAGNOSIS — E876 Hypokalemia: Secondary | ICD-10-CM | POA: Diagnosis not present

## 2012-07-26 LAB — BASIC METABOLIC PANEL
BUN: 14 mg/dL (ref 6–23)
Chloride: 106 mEq/L (ref 96–112)
Creatinine, Ser: 1 mg/dL (ref 0.4–1.5)
GFR: 79.57 mL/min (ref >60.00)
Potassium: 3.5 mEq/L (ref 3.5–5.1)

## 2012-07-26 NOTE — Assessment & Plan Note (Signed)
He seems stable but we will obtain a routine GXT for exercise prescription as well as to exclude ischemia.  Patient agreeable.

## 2012-07-26 NOTE — Assessment & Plan Note (Signed)
No obvious recurrence.  Remains on Amio and pradaxa

## 2012-07-26 NOTE — Assessment & Plan Note (Signed)
His K was low on admission, so we will recheck this value.

## 2012-07-26 NOTE — Progress Notes (Signed)
HPI:  This very nice patient returns today in followup. Since he was discharged from the hospital he has had no symptoms whatsoever. He's been walking a fair amount, and has not had any chest tightness. He's remained on his anticoagulant therapy, and also not had a problem with this. He feels quite good. He was admitted, his cardiac enzymes were negative, and he had no recurrent symptoms or electrocardiographic changes so we elected for him to be  treated medically.  Current Outpatient Prescriptions  Medication Sig Dispense Refill  . acetaminophen (TYLENOL) 500 MG tablet Take 500 mg by mouth every 6 (six) hours as needed. For pain      . amiodarone (PACERONE) 200 MG tablet TAKE ONE TABLET EVERY DAY  30 tablet  9  . amLODipine (NORVASC) 5 MG tablet TAKE ONE TABLET EVERY DAY  90 tablet  3  . aspirin (ECOTRIN LOW STRENGTH) 81 MG EC tablet Take 81 mg by mouth daily. Swallow whole.      . calcium carbonate (TUMS - DOSED IN MG ELEMENTAL CALCIUM) 500 MG chewable tablet Chew 2 tablets by mouth daily.      . Cholecalciferol (VITAMIN D PO) Take 1 tablet by mouth daily.      . Methylcellulose, Laxative, 500 MG TABS Take 2 tablets by mouth 2 (two) times daily.       Marland Kitchen [name] 150 MG CAPS TAKE ONE CAPSULE BY MOUTH EVERY TWELVE HOURS  60 capsule  11  . rosuvastatin (CRESTOR) 20 MG tablet Take 20 mg by mouth daily.       . nitroGLYCERIN (NITROSTAT) 0.4 MG SL tablet Place 1 tablet (0.4 mg total) under the tongue every 5 (five) minutes x 3 doses as needed for chest pain.  25 tablet  3   No current facility-administered medications for this visit.    No Known Allergies  Past Medical History  Diagnosis Date  . Elevated PSA   . HLD (hyperlipidemia)   . Kidney stone   . Diverticulosis   . Gout   . Adenomatous polyp   . Myocardial infarction     silent- 20 yrs ago  . HTN (hypertension)   . Atrial fibrillation   . CAD (coronary artery disease)     a. remote CABG b. prior cardiac cath 2005 c. Lexiscan  Myoview 07/08/12 w/o evidence of ischemia, EF 53%  . Bladder cancer 07/31/2011    Past Surgical History  Procedure Laterality Date  . Coronary artery bypass graft  1993  . Orif acetabular fracture  2008    trimalleolar fracture  . Cardiovascular stress test  02/10/08    6 mets, poor tolerance, no chest pain   . Cardiac catheterization      total occlusion to LAD, LCX, RCA. LIMA patent, normal seq SVG to OM, severe stenosis in SVG to PDA, but collaterals to PLA from OM. RCA went to PDA. treated medically   . Transurethral resection of bladder tumor  07/31/2011    Procedure: TRANSURETHRAL RESECTION OF BLADDER TUMOR (TURBT);  Surgeon: Garnett Farm, MD;  Location: [facility];  Service: Urology;  Laterality: N/A;  with Gyrus    Family History  Problem Relation Age of Onset  . CAD Father 63  . CAD Brother 57  . CAD Brother 19    History   Social History  . Marital Status: Married    Spouse Name: N/A    Number of Children: 3  . Years of Education: N/A   Occupational History  .  retired Visual merchandiser    Social History Main Topics  . Smoking status: Never Smoker   . Smokeless tobacco: Never Used  . Alcohol Use: No  . Drug Use: No  . Sexually Active: Not on file   Other Topics Concern  . Not on file   Social History Narrative   Lives at home with wife.      ROS: Please see the HPI.  All other systems reviewed and negative.  PHYSICAL EXAM:  BP 136/73  Pulse 60  Ht 5\' 11"  (1.803 m)  Wt 227 lb (102.967 kg)  BMI 31.67 kg/m2  SpO2 94%  General: Well developed, well nourished, in no acute distress. Head:  Normocephalic and atraumatic. Neck: no JVD Lungs: Mildly prolonged expiration.  Heart: Normal S1 and S2.  Minimal SEM.  No DM.   Extremities: No clubbing or cyanosis. No edema. Neurologic: Alert and oriented x 3.  EKG:  NSR.  RBBB.  Cannot exclude inferior MI, old.  QTc (but with BBB at baseline)  ASSESSMENT AND PLAN:

## 2012-07-26 NOTE — Patient Instructions (Addendum)
Your physician has requested that you have an exercise tolerance test with Dr Riley Kill. For further information please visit [email or web address] Please also follow instruction sheet, as given.  Your physician recommends that you continue on your current medications as directed. Please refer to the Current Medication list given to you today.  Your physician recommends that you have lab work today: BMP

## 2012-08-18 ENCOUNTER — Ambulatory Visit (INDEPENDENT_AMBULATORY_CARE_PROVIDER_SITE_OTHER): Payer: Medicare Other | Admitting: Cardiology

## 2012-08-18 ENCOUNTER — Ambulatory Visit: Payer: Medicare Other | Admitting: Cardiology

## 2012-08-18 DIAGNOSIS — I2510 Atherosclerotic heart disease of native coronary artery without angina pectoris: Secondary | ICD-10-CM | POA: Diagnosis not present

## 2012-08-18 DIAGNOSIS — I4891 Unspecified atrial fibrillation: Secondary | ICD-10-CM | POA: Diagnosis not present

## 2012-08-18 LAB — BASIC METABOLIC PANEL
Calcium: 9.3 mg/dL (ref 8.4–10.5)
GFR: 64.08 mL/min (ref >60.00)
Glucose, Bld: 117 mg/dL — ABNORMAL HIGH (ref 70–99)
Potassium: 4.2 mEq/L (ref 3.5–5.1)
Sodium: 140 mEq/L (ref 135–145)

## 2012-08-18 NOTE — Procedures (Signed)
Exercise Treadmill Test  Pre-Exercise Testing Evaluation Rhythm: normal sinus  Rate: 60     Test  Exercise Tolerance Test Ordering MD: Shawnie Pons, MD  Interpreting MD: Shawnie Pons, MD  Unique Test No: 1   Treadmill:  1  Indication for ETT: chest pain - rule out ischemia  Contraindication to ETT: No   Stress Modality: exercise - treadmill  Cardiac Imaging Performed: non   Protocol: standard Bruce - maximal  Max BP:  176/71  Max MPHR (bpm):  142 85% MPR (bpm):  121  MPHR obtained (bpm):  125 % MPHR obtained:  87%  Reached 85% MPHR (min:sec):  4:58 Total Exercise Time (min-sec):  6:00  Workload in METS:  7 Borg Scale: 15  Reason ETT Terminated:  fatigue    ST Segment Analysis At Rest: normal ST segments - no evidence of significant ST depression With Exercise: non-specific ST changes  Other Information Arrhythmia:  No Angina during ETT:  absent (0) Quality of ETT:  diagnostic  ETT Interpretation:  normal - no evidence of ischemia by ST analysis  Comments: This nice gentleman exercised on the Bruce Protocol.  He finished 6 minutes, and had no chest pain. Baseline ECG shows RBBB, and there were as expected non diagnostic ECG change in V1-V3. There were no diagnostic ST changes in remaining leads at a diagnostic HR.  No definite ischemia demonstrated.  Fair exercise tolerance.    Recommendations:  1. He had recent atypical chest pain.  He is on anticoagulation and antiarrythmic therapy and stable. He did not have onset of chest pain or diagnostic ECG at a good work load.  Continued medical therapy is warranted.  We will arrange fu with Dr. Jens Som.

## 2012-08-18 NOTE — Patient Instructions (Addendum)
Your physician recommends that you have lab work today: [facility]  Your physician recommends that you schedule a follow-up appointment in: 2-3 MONTHS with Dr Jens Som (previous pt of Dr Riley Kill)

## 2012-08-22 ENCOUNTER — Other Ambulatory Visit: Payer: Self-pay | Admitting: *Deleted

## 2012-08-22 MED ORDER — AMIODARONE HCL 200 MG PO TABS: 200.0000 mg | ORAL_TABLET | Freq: Every day | ORAL | Status: DC

## 2012-08-24 ENCOUNTER — Other Ambulatory Visit: Payer: Self-pay | Admitting: *Deleted

## 2012-09-02 ENCOUNTER — Other Ambulatory Visit: Payer: Self-pay | Admitting: *Deleted

## 2012-09-02 MED ORDER — DABIGATRAN ETEXILATE MESYLATE 150 MG PO CAPS: 150.0000 mg | ORAL_CAPSULE | Freq: Two times a day (BID) | ORAL | Status: DC

## 2012-09-13 DIAGNOSIS — N401 Enlarged prostate with lower urinary tract symptoms: Secondary | ICD-10-CM | POA: Diagnosis not present

## 2012-09-20 DIAGNOSIS — N401 Enlarged prostate with lower urinary tract symptoms: Secondary | ICD-10-CM | POA: Diagnosis not present

## 2012-09-20 DIAGNOSIS — R972 Elevated prostate specific antigen [PSA]: Secondary | ICD-10-CM | POA: Diagnosis not present

## 2012-09-20 DIAGNOSIS — Z8551 Personal history of malignant neoplasm of bladder: Secondary | ICD-10-CM | POA: Diagnosis not present

## 2012-10-20 DIAGNOSIS — H1045 Other chronic allergic conjunctivitis: Secondary | ICD-10-CM | POA: Diagnosis not present

## 2012-10-26 ENCOUNTER — Ambulatory Visit (INDEPENDENT_AMBULATORY_CARE_PROVIDER_SITE_OTHER): Payer: Medicare Other | Admitting: Cardiology

## 2012-10-26 ENCOUNTER — Encounter: Payer: Self-pay | Admitting: Cardiology

## 2012-10-26 VITALS — BP 120/80 | HR 62 | Wt 228.0 lb

## 2012-10-26 DIAGNOSIS — E785 Hyperlipidemia, unspecified: Secondary | ICD-10-CM

## 2012-10-26 DIAGNOSIS — I4891 Unspecified atrial fibrillation: Secondary | ICD-10-CM

## 2012-10-26 DIAGNOSIS — I2510 Atherosclerotic heart disease of native coronary artery without angina pectoris: Secondary | ICD-10-CM | POA: Diagnosis not present

## 2012-10-26 DIAGNOSIS — I10 Essential (primary) hypertension: Secondary | ICD-10-CM

## 2012-10-26 NOTE — Assessment & Plan Note (Signed)
Continue statin. Last nuclear study showed no ischemia.

## 2012-10-26 NOTE — Assessment & Plan Note (Signed)
Patient remains in sinus rhythm. Continue amiodarone. Continue pradaxa. Will have his most recent laboratories sent to [place] from his primary care physician. We will need renal function, liver functions and TSH. Repeat chest x-ray in June.

## 2012-10-26 NOTE — Progress Notes (Signed)
HPI: 79 year old male previously followed by Dr. Riley Kill for followup of coronary artery disease status post coronary artery bypassing graft and atrial fibrillation. Patient had previous coronary artery bypassing graft in 1993. Last cardiac catheterization showed occluded LAD, circumflex and right coronary artery. LIMA to the LAD was patent, saphenous vein graft to obtuse marginal patent. The saphenous vein graft to the PDA had a severe stenosis but there were collaterals from the obtuse marginal. Medical therapy recommended. Echocardiogram in March of 2012 showed normal LV function, mild to moderate left atrial enlargement, mild right atrial enlargement, trace aortic and mitral regurgitation. Nuclear study in January of 2014 showed no ischemia or infarction in his ejection fraction was 53%. Since he was last seen he denies dyspnea, chest pain, palpitations or syncope.  Current Outpatient Prescriptions  Medication Sig Dispense Refill  . acetaminophen (TYLENOL) 500 MG tablet Take 500 mg by mouth every 6 (six) hours as needed. For pain      . amiodarone (PACERONE) 200 MG tablet Take 1 tablet (200 mg total) by mouth daily.  30 tablet  9  . amLODipine (NORVASC) 5 MG tablet TAKE ONE TABLET EVERY DAY  90 tablet  3  . aspirin (ECOTRIN LOW STRENGTH) 81 MG EC tablet Take 81 mg by mouth daily. Swallow whole.      . calcium carbonate (TUMS - DOSED IN MG ELEMENTAL CALCIUM) 500 MG chewable tablet Chew 2 tablets by mouth daily.      . Cholecalciferol (VITAMIN D PO) Take 1 tablet by mouth daily.      . dabigatran ([name]) 150 MG CAPS Take 1 capsule (150 mg total) by mouth every 12 (twelve) hours.  60 capsule  11  . loratadine (CLARITIN) 10 MG tablet Take 10 mg by mouth daily.      . Methylcellulose, Laxative, 500 MG TABS Take 2 tablets by mouth 2 (two) times daily.       . nitroGLYCERIN (NITROSTAT) 0.4 MG SL tablet Place 1 tablet (0.4 mg total) under the tongue every 5 (five) minutes x 3 doses as needed for chest  pain.  25 tablet  3  . rosuvastatin (CRESTOR) 20 MG tablet Take 20 mg by mouth daily.        No current facility-administered medications for this visit.     Past Medical History  Diagnosis Date  . Elevated PSA   . HLD (hyperlipidemia)   . Kidney stone   . Diverticulosis   . Gout   . Adenomatous polyp   . Myocardial infarction     silent- 20 yrs ago  . HTN (hypertension)   . Atrial fibrillation   . CAD (coronary artery disease)     a. remote CABG b. prior cardiac cath 2005 c. Lexiscan Myoview 07/08/12 w/o evidence of ischemia, EF 53%  . Bladder cancer 07/31/2011    Past Surgical History  Procedure Laterality Date  . Coronary artery bypass graft  1993  . Orif acetabular fracture  2008    trimalleolar fracture  . Cardiovascular stress test  02/10/08    6 mets, poor tolerance, no chest pain   . Cardiac catheterization      total occlusion to LAD, LCX, RCA. LIMA patent, normal seq SVG to OM, severe stenosis in SVG to PDA, but collaterals to PLA from OM. RCA went to PDA. treated medically   . Transurethral resection of bladder tumor  07/31/2011    Procedure: TRANSURETHRAL RESECTION OF BLADDER TUMOR (TURBT);  Surgeon: Garnett Farm, MD;  Location:  [facility];  Service: Urology;  Laterality: N/A;  with Gyrus    History   Social History  . Marital Status: Married    Spouse Name: N/A    Number of Children: 3  . Years of Education: N/A   Occupational History  . retired Visual merchandiser    Social History Main Topics  . Smoking status: Never Smoker   . Smokeless tobacco: Never Used  . Alcohol Use: No  . Drug Use: No  . Sexually Active: Not on file   Other Topics Concern  . Not on file   Social History Narrative   Lives at home with wife.      ROS: no fevers or chills, productive cough, hemoptysis, dysphasia, odynophagia, melena, hematochezia, dysuria, hematuria, rash, seizure activity, orthopnea, PND, pedal edema, claudication. Remaining systems are negative.  Physical  Exam: Well-developed well-nourished in no acute distress.  Skin is warm and dry.  HEENT is normal.  Neck is supple.  Chest is clear to auscultation with normal expansion.  Cardiovascular exam is regular rate and rhythm.  Abdominal exam nontender or distended. No masses palpated. Extremities show 1+ edema. neuro grossly intact  ECG sinus rhythm at a rate of 62. Right bundle branch block. Prior inferior infarct. Nonspecific T-wave changes.

## 2012-10-26 NOTE — Assessment & Plan Note (Signed)
Continue present blood pressure medications. 

## 2012-10-26 NOTE — Patient Instructions (Addendum)
Your physician wants you to follow-up in: 6 MONTHS WITH DR Jens Som You will receive a reminder letter in the mail two months in advance. If you don't receive a letter, please call our office to schedule the follow-up appointment.   A chest x-ray takes a picture of the organs and structures inside the chest, including the heart, lungs, and blood vessels. This test can show several things, including, whether the heart is enlarges; whether fluid is building up in the lungs; and whether pacemaker / defibrillator leads are still in place. HAVE DONE IN June AT THE [facility] OFFICE=[address]

## 2012-10-26 NOTE — Assessment & Plan Note (Signed)
Continue statin. 

## 2012-10-28 DIAGNOSIS — M25519 Pain in unspecified shoulder: Secondary | ICD-10-CM | POA: Diagnosis not present

## 2012-10-28 DIAGNOSIS — M19019 Primary osteoarthritis, unspecified shoulder: Secondary | ICD-10-CM | POA: Diagnosis not present

## 2012-12-01 ENCOUNTER — Encounter: Payer: Self-pay | Admitting: Cardiology

## 2012-12-01 ENCOUNTER — Ambulatory Visit (INDEPENDENT_AMBULATORY_CARE_PROVIDER_SITE_OTHER)
Admission: RE | Admit: 2012-12-01 | Discharge: 2012-12-01 | Disposition: A | Discharge status: Home / Self Care | Payer: Medicare Other | Source: Ambulatory Visit | Attending: Cardiology | Admitting: Cardiology

## 2012-12-01 DIAGNOSIS — J984 Other disorders of lung: Secondary | ICD-10-CM | POA: Diagnosis not present

## 2012-12-01 DIAGNOSIS — D235 Other benign neoplasm of skin of trunk: Secondary | ICD-10-CM | POA: Diagnosis not present

## 2012-12-01 DIAGNOSIS — I4891 Unspecified atrial fibrillation: Secondary | ICD-10-CM

## 2012-12-01 DIAGNOSIS — E7800 Pure hypercholesterolemia, unspecified: Secondary | ICD-10-CM | POA: Diagnosis not present

## 2012-12-01 DIAGNOSIS — Z006 Encounter for examination for normal comparison and control in clinical research program: Secondary | ICD-10-CM | POA: Diagnosis not present

## 2012-12-01 DIAGNOSIS — K5730 Diverticulosis of large intestine without perforation or abscess without bleeding: Secondary | ICD-10-CM | POA: Diagnosis not present

## 2012-12-01 DIAGNOSIS — Z125 Encounter for screening for malignant neoplasm of prostate: Secondary | ICD-10-CM | POA: Diagnosis not present

## 2012-12-01 DIAGNOSIS — B353 Tinea pedis: Secondary | ICD-10-CM | POA: Diagnosis not present

## 2012-12-01 DIAGNOSIS — I10 Essential (primary) hypertension: Secondary | ICD-10-CM | POA: Diagnosis not present

## 2012-12-01 DIAGNOSIS — Z7901 Long term (current) use of anticoagulants: Secondary | ICD-10-CM | POA: Diagnosis not present

## 2012-12-01 DIAGNOSIS — Z0000 Encounter for general adult medical examination without abnormal findings: Secondary | ICD-10-CM | POA: Diagnosis not present

## 2012-12-01 DIAGNOSIS — J309 Allergic rhinitis, unspecified: Secondary | ICD-10-CM | POA: Diagnosis not present

## 2012-12-03 ENCOUNTER — Other Ambulatory Visit: Payer: Self-pay | Admitting: Cardiology

## 2012-12-05 ENCOUNTER — Other Ambulatory Visit: Payer: Self-pay | Admitting: *Deleted

## 2012-12-05 DIAGNOSIS — R9389 Abnormal findings on diagnostic imaging of other specified body structures: Secondary | ICD-10-CM

## 2012-12-08 DIAGNOSIS — Z0000 Encounter for general adult medical examination without abnormal findings: Secondary | ICD-10-CM | POA: Diagnosis not present

## 2012-12-08 DIAGNOSIS — E7800 Pure hypercholesterolemia, unspecified: Secondary | ICD-10-CM | POA: Diagnosis not present

## 2012-12-08 DIAGNOSIS — Z87898 Personal history of other specified conditions: Secondary | ICD-10-CM | POA: Diagnosis not present

## 2012-12-08 DIAGNOSIS — H6120 Impacted cerumen, unspecified ear: Secondary | ICD-10-CM | POA: Diagnosis not present

## 2012-12-08 DIAGNOSIS — I2510 Atherosclerotic heart disease of native coronary artery without angina pectoris: Secondary | ICD-10-CM | POA: Diagnosis not present

## 2012-12-08 DIAGNOSIS — I10 Essential (primary) hypertension: Secondary | ICD-10-CM | POA: Diagnosis not present

## 2013-01-19 ENCOUNTER — Telehealth: Payer: Self-pay | Admitting: Cardiology

## 2013-01-19 NOTE — Telephone Encounter (Signed)
Spoke with pt wife, aware the pradaxa samples placed at the front desk are 75 mg and the pt will need to take two tablets at the same time twice daily. Wife voiced understanding.

## 2013-01-19 NOTE — Telephone Encounter (Signed)
New Prob  Pt is requesting samples of [name]. They will be in [place] on Monday and want to know if they can get them then.

## 2013-01-23 DIAGNOSIS — R972 Elevated prostate specific antigen [PSA]: Secondary | ICD-10-CM | POA: Diagnosis not present

## 2013-01-23 DIAGNOSIS — Z8551 Personal history of malignant neoplasm of bladder: Secondary | ICD-10-CM | POA: Diagnosis not present

## 2013-01-31 DIAGNOSIS — M25519 Pain in unspecified shoulder: Secondary | ICD-10-CM | POA: Diagnosis not present

## 2013-01-31 DIAGNOSIS — M19019 Primary osteoarthritis, unspecified shoulder: Secondary | ICD-10-CM | POA: Diagnosis not present

## 2013-02-09 ENCOUNTER — Ambulatory Visit (INDEPENDENT_AMBULATORY_CARE_PROVIDER_SITE_OTHER)
Admission: RE | Admit: 2013-02-09 | Discharge: 2013-02-09 | Disposition: A | Discharge status: Home / Self Care | Payer: Medicare Other | Source: Ambulatory Visit | Attending: Cardiology | Admitting: Cardiology

## 2013-02-09 DIAGNOSIS — R918 Other nonspecific abnormal finding of lung field: Secondary | ICD-10-CM | POA: Diagnosis not present

## 2013-02-09 DIAGNOSIS — J984 Other disorders of lung: Secondary | ICD-10-CM | POA: Diagnosis not present

## 2013-02-09 DIAGNOSIS — R9389 Abnormal findings on diagnostic imaging of other specified body structures: Secondary | ICD-10-CM

## 2013-02-09 DIAGNOSIS — M25519 Pain in unspecified shoulder: Secondary | ICD-10-CM | POA: Diagnosis not present

## 2013-02-10 ENCOUNTER — Encounter: Payer: Self-pay | Admitting: Cardiology

## 2013-02-10 ENCOUNTER — Ambulatory Visit (INDEPENDENT_AMBULATORY_CARE_PROVIDER_SITE_OTHER): Payer: Medicare Other | Admitting: Cardiology

## 2013-02-10 VITALS — BP 138/66 | HR 58 | Ht 71.0 in | Wt 223.1 lb

## 2013-02-10 DIAGNOSIS — E7800 Pure hypercholesterolemia, unspecified: Secondary | ICD-10-CM | POA: Diagnosis not present

## 2013-02-10 DIAGNOSIS — I4891 Unspecified atrial fibrillation: Secondary | ICD-10-CM

## 2013-02-10 DIAGNOSIS — I10 Essential (primary) hypertension: Secondary | ICD-10-CM

## 2013-02-10 DIAGNOSIS — I25810 Atherosclerosis of coronary artery bypass graft(s) without angina pectoris: Secondary | ICD-10-CM | POA: Diagnosis not present

## 2013-02-10 DIAGNOSIS — Z01810 Encounter for preprocedural cardiovascular examination: Secondary | ICD-10-CM

## 2013-02-10 LAB — HEPATIC FUNCTION PANEL
AST: 27 U/L (ref 0–37)
Albumin: 3.9 g/dL (ref 3.5–5.2)
Alkaline Phosphatase: 47 U/L (ref 39–117)
Total Bilirubin: 0.7 mg/dL (ref 0.3–1.2)

## 2013-02-10 LAB — BASIC METABOLIC PANEL
CO2: 27 mEq/L (ref 19–32)
Calcium: 9.4 mg/dL (ref 8.4–10.5)
Creatinine, Ser: 1.1 mg/dL (ref 0.4–1.5)
Glucose, Bld: 94 mg/dL (ref 70–99)

## 2013-02-10 LAB — CBC WITH DIFFERENTIAL/PLATELET
Basophils Absolute: 0 10*3/uL (ref 0.0–0.1)
Eosinophils Absolute: 0.1 10*3/uL (ref 0.0–0.7)
Lymphocytes Relative: 15.3 % (ref 12.0–46.0)
MCHC: 34.2 g/dL (ref 30.0–36.0)
Neutrophils Relative %: 70.7 % (ref 43.0–77.0)
RDW: 13.1 % (ref 11.5–14.6)

## 2013-02-10 LAB — TSH: TSH: 2.29 u[IU]/mL (ref 0.35–5.50)

## 2013-02-10 NOTE — Assessment & Plan Note (Signed)
Continue present blood pressure medications. 

## 2013-02-10 NOTE — Assessment & Plan Note (Signed)
Continue statin. Not on aspirin given need for anticoagulation. 

## 2013-02-10 NOTE — Assessment & Plan Note (Signed)
Patient remains in sinus rhythm. Continue amiodarone. He had a repeat chest x-ray yesterday with results pending. Check liver functions and TSH. Continue pradaxa. Check hemoglobin and renal function.

## 2013-02-10 NOTE — Patient Instructions (Signed)
Your physician wants you to follow-up in: 6 MONTHS WITH DR CRENSHAW You will receive a reminder letter in the mail two months in advance. If you don't receive a letter, please call our office to schedule the follow-up appointment.   Your physician recommends that you HAVE LAB WORK TODAY 

## 2013-02-10 NOTE — Assessment & Plan Note (Signed)
Continue statin. 

## 2013-02-10 NOTE — Assessment & Plan Note (Signed)
Patient has had no recent chest pain and recent nuclear study negative. He may proceed with surgery without further cardiac evaluation. Hold pradaxa 3 days prior to procedure and resume after when okay with surgery.

## 2013-02-10 NOTE — Progress Notes (Signed)
HPI: Followup coronary artery disease status post coronary artery bypassing graft and atrial fibrillation. Patient had previous coronary artery bypassing graft in 1993. Last cardiac catheterization showed occluded LAD, circumflex and right coronary artery. LIMA to the LAD was patent, saphenous vein graft to obtuse marginal patent. The saphenous vein graft to the PDA had a severe stenosis but there were collaterals from the obtuse marginal. Medical therapy recommended. Echocardiogram in March of 2012 showed normal LV function, mild to moderate left atrial enlargement, mild right atrial enlargement, trace aortic and mitral regurgitation. Nuclear study in January of 2014 showed no ischemia or infarction in his ejection fraction was 53%. Since he was last seen in May of 2014, he denies dyspnea, chest pain, palpitations or syncope. He is having problems with left shoulder pain which will require replacement. We were asked to evaluate preoperatively.   Current Outpatient Prescriptions  Medication Sig Dispense Refill  . amiodarone (PACERONE) 200 MG tablet Take 1 tablet (200 mg total) by mouth daily.  30 tablet  9  . amLODipine (NORVASC) 5 MG tablet TAKE ONE TABLET EVERY DAY  90 tablet  1  . calcium carbonate (TUMS - DOSED IN MG ELEMENTAL CALCIUM) 500 MG chewable tablet Chew 2 tablets by mouth daily.      . Cholecalciferol (VITAMIN D PO) Take 1 tablet by mouth daily.      . dabigatran ([name]) 150 MG CAPS Take 1 capsule (150 mg total) by mouth every 12 (twelve) hours.  60 capsule  11  . HYDROcodone-acetaminophen (NORCO/VICODIN) 5-325 MG per tablet 1 tablet every 6 (six) hours as needed.      . Methylcellulose, Laxative, 500 MG TABS Take 2 tablets by mouth 2 (two) times daily.       . nitroGLYCERIN (NITROSTAT) 0.4 MG SL tablet Place 1 tablet (0.4 mg total) under the tongue every 5 (five) minutes x 3 doses as needed for chest pain.  25 tablet  3  . rosuvastatin (CRESTOR) 20 MG tablet Take 20 mg by mouth  daily.        No current facility-administered medications for this visit.     Past Medical History  Diagnosis Date  . Elevated PSA   . HLD (hyperlipidemia)   . Kidney stone   . Diverticulosis   . Gout   . Adenomatous polyp   . Myocardial infarction     silent- 20 yrs ago  . HTN (hypertension)   . Atrial fibrillation   . CAD (coronary artery disease)     a. remote CABG b. prior cardiac cath 2005 c. Lexiscan Myoview 07/08/12 w/o evidence of ischemia, EF 53%  . Bladder cancer 07/31/2011    Past Surgical History  Procedure Laterality Date  . Coronary artery bypass graft  1993  . Orif acetabular fracture  2008    trimalleolar fracture  . Cardiovascular stress test  02/10/08    6 mets, poor tolerance, no chest pain   . Cardiac catheterization      total occlusion to LAD, LCX, RCA. LIMA patent, normal seq SVG to OM, severe stenosis in SVG to PDA, but collaterals to PLA from OM. RCA went to PDA. treated medically   . Transurethral resection of bladder tumor  07/31/2011    Procedure: TRANSURETHRAL RESECTION OF BLADDER TUMOR (TURBT);  Surgeon: Garnett Farm, MD;  Location: [facility];  Service: Urology;  Laterality: N/A;  with Gyrus    History   Social History  . Marital Status: Married    Spouse Name:  N/A    Number of Children: 3  . Years of Education: N/A   Occupational History  . retired Visual merchandiser    Social History Main Topics  . Smoking status: Never Smoker   . Smokeless tobacco: Never Used  . Alcohol Use: No  . Drug Use: No  . Sexual Activity: Not on file   Other Topics Concern  . Not on file   Social History Narrative   Lives at home with wife.      ROS: left shoulder pain but no fevers or chills, productive cough, hemoptysis, dysphasia, odynophagia, melena, hematochezia, dysuria, hematuria, rash, seizure activity, orthopnea, PND, claudication. Remaining systems are negative.  Physical Exam: Well-developed well-nourished in no acute distress.  Skin is warm and dry.   HEENT is normal.  Neck is supple.  Chest is clear to auscultation with normal expansion.  Cardiovascular exam is regular rate and rhythm.  Abdominal exam nontender or distended. No masses palpated. Extremities show trace to 1+ ankle edema. neuro grossly intact  ECG sinus rhythm at a rate of 56. Right bundle branch block. Prolonged QT interval. Prior inferior infarct.

## 2013-02-13 ENCOUNTER — Telehealth: Payer: Self-pay | Admitting: Cardiology

## 2013-02-13 NOTE — Telephone Encounter (Deleted)
Ok for surgery D/C  [name] 3 days before & resume after when ok before surgery B. Crenshaw

## 2013-02-14 NOTE — Telephone Encounter (Signed)
Ok for surgery D/C  [name] 3 days before & resume after when ok with surgery B. Crenshaw

## 2013-02-23 ENCOUNTER — Encounter (HOSPITAL_COMMUNITY): Payer: Self-pay | Admitting: Pharmacy Technician

## 2013-02-24 ENCOUNTER — Encounter (HOSPITAL_COMMUNITY)
Admission: RE | Admit: 2013-02-24 | Discharge: 2013-02-24 | Disposition: A | Discharge status: Home / Self Care | Payer: Medicare Other | Source: Ambulatory Visit | Attending: Anesthesiology | Admitting: Anesthesiology

## 2013-02-24 ENCOUNTER — Encounter (HOSPITAL_COMMUNITY): Payer: Self-pay

## 2013-02-24 ENCOUNTER — Encounter (HOSPITAL_COMMUNITY)
Admission: RE | Admit: 2013-02-24 | Discharge: 2013-02-24 | Disposition: A | Discharge status: Home / Self Care | Payer: Medicare Other | Source: Ambulatory Visit | Attending: Orthopedic Surgery | Admitting: Orthopedic Surgery

## 2013-02-24 DIAGNOSIS — Z01812 Encounter for preprocedural laboratory examination: Secondary | ICD-10-CM | POA: Diagnosis not present

## 2013-02-24 DIAGNOSIS — Z01818 Encounter for other preprocedural examination: Secondary | ICD-10-CM | POA: Insufficient documentation

## 2013-02-24 HISTORY — DX: Personal history of urinary calculi: Z87.442

## 2013-02-24 HISTORY — DX: Gastro-esophageal reflux disease without esophagitis: K21.9

## 2013-02-24 HISTORY — DX: Unspecified osteoarthritis, unspecified site: M19.90

## 2013-02-24 LAB — BASIC METABOLIC PANEL
Chloride: 103 mEq/L (ref 96–112)
Creatinine, Ser: 1.02 mg/dL (ref 0.50–1.35)
GFR calc Af Amer: 79 mL/min — ABNORMAL LOW (ref >90)
Potassium: 3.9 mEq/L (ref 3.5–5.1)
Sodium: 138 mEq/L (ref 135–145)

## 2013-02-24 LAB — SURGICAL PCR SCREEN
MRSA, PCR: NEGATIVE
Staphylococcus aureus: NEGATIVE

## 2013-02-24 LAB — CBC
HCT: 45.9 % (ref 39.0–52.0)
Hemoglobin: 15.8 g/dL (ref 13.0–17.0)
RBC: 4.85 MIL/uL (ref 4.22–5.81)
RDW: 12.8 % (ref 11.5–15.5)
WBC: 9.9 10*3/uL (ref 4.0–10.5)

## 2013-02-24 LAB — TYPE AND SCREEN: Antibody Screen: NEGATIVE

## 2013-02-24 LAB — PROTIME-INR: Prothrombin Time: 17.3 seconds — ABNORMAL HIGH (ref 11.6–15.2)

## 2013-02-24 LAB — APTT: aPTT: 56 seconds — ABNORMAL HIGH (ref 24–37)

## 2013-02-24 NOTE — Progress Notes (Signed)
02/24/13 1301  OBSTRUCTIVE SLEEP APNEA  Have you ever been diagnosed with sleep apnea through a sleep study? No  Do you snore loudly (loud enough to be heard through closed doors)?  0  Do you often feel tired, fatigued, or sleepy during the daytime? 0  Has anyone observed you stop breathing during your sleep? 0  Do you have, or are you being treated for high blood pressure? 1  BMI more than 35 kg/m2? 0  Age over 77 years old? 1  Neck circumference greater than 40 cm/18 inches? 1 (18)  Gender: 1  Obstructive Sleep Apnea Score 4  Score 4 or greater  Results sent to PCP

## 2013-02-24 NOTE — Pre-Procedure Instructions (Addendum)
Evan Dorsey  02/24/2013   Your procedure is scheduled on:  03/02/13  Report to [facility] at530 AM.  Call this number if you have problems the morning of surgery: [phone]   Remember:   Do not eat food or drink liquids after midnight.   Take these medicines the morning of surgery with A SIP OF WATER: amiodarone, amlodipine, pain med if needed              STOP pradaxa per dr (3 days prior to surgery) 02/27/13   Do not wear jewelry, make-up or nail polish.  Do not wear lotions, powders, or perfumes. You may wear deodorant.  Do not shave 48 hours prior to surgery. Men may shave face and neck.  Do not bring valuables to the hospital.  [name] is not responsible                   for any belongings or valuables.  Contacts, dentures or bridgework may not be worn into surgery.  Leave suitcase in the car. After surgery it may be brought to your room.  For patients admitted to the hospital, checkout time is 11:00 AM the day of  discharge.   Patients discharged the day of surgery will not be allowed to drive  home.  Name and phone number of your driver:   Special Instructions: Incentive Spirometry - Practice and bring it with you on the day of surgery. Shower using CHG 2 nights before surgery and the night before surgery.  If you shower the day of surgery use CHG.  Use special wash - you have one bottle of CHG for all showers.  You should use approximately 1/3 of the bottle for each shower.   Please read over the following fact sheets that you were given: Pain Booklet, Coughing and Deep Breathing, Blood Transfusion Information, MRSA Information and Surgical Site Infection Prevention

## 2013-02-24 NOTE — Progress Notes (Addendum)
Card clearance note 8/14,echo 3/12,stress 1/14,ekg 8/14, cxr 6/14

## 2013-02-28 NOTE — Progress Notes (Signed)
Anesthesia Chart Review:  Patient is a 79 year old male scheduled for left total shoulder arthroplasty on 03/02/2013 by Dr. Rennis Chris. History includes CAD/MI s/p CABG '93, afib, bladder cancer s/p TURBT 07/2011, GERD, hypertension, nephrolithiasis, gout, hyperlipidemia, diverticulosis.  Cardiologist is Dr. Jens Som, last visit on 02/10/13 for follow-up and preoperative evaluation.  Patient was felt okay for surgery with permission to hold [name] three day preoperatively.  ECG sinus rhythm at a rate of 56. Right bundle branch block. Prolonged QT interval. Prior inferior infarct.  Echocardiogram in March of 2012 showed normal LV function, mild to moderate left atrial enlargement, mild right atrial enlargement, trace aortic and mitral regurgitation. Nuclear study on 07/08/2012 showed no ischemia or infarction in his ejection fraction was 53%.  Exercise Treadmill Test on 08/18/12 showed: Exercised on the Bruce Protocol. He finished 6 minutes, and had no chest pain. Baseline ECG shows RBBB, and there were as expected non diagnostic ECG change in V1-V3. There  were no diagnostic ST changes in remaining leads at a diagnostic HR. No definite ischemia demonstrated. Fair exercise tolerance. Continued medical therapy was recommended.  By Dr. Ludwig Clarks notes, his last cardiac catheterization (date ?) showed occluded LAD, circumflex and right coronary artery. LIMA to the LAD was patent, saphenous vein graft to obtuse marginal patent. The saphenous vein graft to the PDA had a severe stenosis but there were collaterals from the obtuse marginal. Medical therapy recommended.  CXR on 02/09/13 showed no active disease. Stable bilateral basilar atelectasis or scarring, right greater than left.  Preoperative labs noted.  PT/PTT elevated.  Recheck on the day of surgery--he should be off [name] by then.  Velna Ochs [facility]/Anesthesiology Phone [phone] 02/28/2013 11:35 AM

## 2013-03-01 MED ORDER — CEFAZOLIN SODIUM-DEXTROSE 2-3 GM-% IV SOLR
2.0000 g | INTRAVENOUS | Status: AC
  Administered 2013-03-02: 2 g via INTRAVENOUS
  Filled 2013-03-01: qty 50

## 2013-03-02 ENCOUNTER — Encounter (HOSPITAL_COMMUNITY): Payer: Self-pay | Admitting: Vascular Surgery

## 2013-03-02 ENCOUNTER — Inpatient Hospital Stay (HOSPITAL_COMMUNITY)
Admission: RE | Admit: 2013-03-02 | Discharge: 2013-03-03 | DRG: 484 | Disposition: A | Discharge status: Home / Self Care | Payer: Medicare Other | Source: Ambulatory Visit | Attending: Orthopedic Surgery | Admitting: Orthopedic Surgery

## 2013-03-02 ENCOUNTER — Inpatient Hospital Stay (HOSPITAL_COMMUNITY): Payer: Medicare Other | Admitting: Certified Registered Nurse Anesthetist

## 2013-03-02 ENCOUNTER — Encounter (HOSPITAL_COMMUNITY): Payer: Self-pay | Admitting: *Deleted

## 2013-03-02 ENCOUNTER — Encounter (HOSPITAL_COMMUNITY)
Admission: RE | Disposition: A | Discharge status: Home / Self Care | Payer: Self-pay | Source: Ambulatory Visit | Attending: Orthopedic Surgery

## 2013-03-02 DIAGNOSIS — I10 Essential (primary) hypertension: Secondary | ICD-10-CM | POA: Diagnosis present

## 2013-03-02 DIAGNOSIS — Z79899 Other long term (current) drug therapy: Secondary | ICD-10-CM

## 2013-03-02 DIAGNOSIS — K219 Gastro-esophageal reflux disease without esophagitis: Secondary | ICD-10-CM | POA: Diagnosis present

## 2013-03-02 DIAGNOSIS — I4891 Unspecified atrial fibrillation: Secondary | ICD-10-CM | POA: Diagnosis present

## 2013-03-02 DIAGNOSIS — Z7901 Long term (current) use of anticoagulants: Secondary | ICD-10-CM | POA: Diagnosis not present

## 2013-03-02 DIAGNOSIS — M19019 Primary osteoarthritis, unspecified shoulder: Secondary | ICD-10-CM | POA: Diagnosis not present

## 2013-03-02 DIAGNOSIS — G8918 Other acute postprocedural pain: Secondary | ICD-10-CM | POA: Diagnosis not present

## 2013-03-02 DIAGNOSIS — Z8249 Family history of ischemic heart disease and other diseases of the circulatory system: Secondary | ICD-10-CM

## 2013-03-02 DIAGNOSIS — E785 Hyperlipidemia, unspecified: Secondary | ICD-10-CM | POA: Diagnosis not present

## 2013-03-02 DIAGNOSIS — I2510 Atherosclerotic heart disease of native coronary artery without angina pectoris: Secondary | ICD-10-CM | POA: Diagnosis present

## 2013-03-02 DIAGNOSIS — I252 Old myocardial infarction: Secondary | ICD-10-CM | POA: Diagnosis not present

## 2013-03-02 DIAGNOSIS — Z951 Presence of aortocoronary bypass graft: Secondary | ICD-10-CM

## 2013-03-02 DIAGNOSIS — K5730 Diverticulosis of large intestine without perforation or abscess without bleeding: Secondary | ICD-10-CM | POA: Diagnosis present

## 2013-03-02 DIAGNOSIS — M109 Gout, unspecified: Secondary | ICD-10-CM | POA: Diagnosis present

## 2013-03-02 HISTORY — DX: Frequency of micturition: R35.0

## 2013-03-02 HISTORY — PX: TOTAL SHOULDER ARTHROPLASTY: SHX126

## 2013-03-02 LAB — PROTIME-INR: INR: 1.07 (ref 0.00–1.49)

## 2013-03-02 SURGERY — ARTHROPLASTY, SHOULDER, TOTAL
Anesthesia: Regional | Site: Shoulder | Laterality: Left | Wound class: Clean

## 2013-03-02 MED ORDER — GLYCOPYRROLATE 0.2 MG/ML IJ SOLN
INTRAMUSCULAR | Status: DC | PRN
  Administered 2013-03-02: 0.6 mg via INTRAVENOUS

## 2013-03-02 MED ORDER — FENTANYL CITRATE 0.05 MG/ML IJ SOLN
INTRAMUSCULAR | Status: DC | PRN
  Administered 2013-03-02: 50 ug via INTRAVENOUS
  Administered 2013-03-02: 100 ug via INTRAVENOUS
  Administered 2013-03-02: 50 ug via INTRAVENOUS

## 2013-03-02 MED ORDER — BUPIVACAINE-EPINEPHRINE PF 0.5-1:200000 % IJ SOLN
INTRAMUSCULAR | Status: DC | PRN
  Administered 2013-03-02: 125 mg

## 2013-03-02 MED ORDER — METOCLOPRAMIDE HCL 5 MG/ML IJ SOLN: 5.0000 mg | Freq: Three times a day (TID) | INTRAMUSCULAR | Status: DC | PRN

## 2013-03-02 MED ORDER — ALUM & MAG HYDROXIDE-SIMETH 200-200-20 MG/5ML PO SUSP: 30.0000 mL | ORAL | Status: DC | PRN

## 2013-03-02 MED ORDER — EPHEDRINE SULFATE 50 MG/ML IJ SOLN
INTRAMUSCULAR | Status: DC | PRN
  Administered 2013-03-02: 15 mg via INTRAVENOUS
  Administered 2013-03-02: 10 mg via INTRAVENOUS
  Administered 2013-03-02: 5 mg via INTRAVENOUS
  Administered 2013-03-02 (×2): 10 mg via INTRAVENOUS

## 2013-03-02 MED ORDER — CEFAZOLIN SODIUM-DEXTROSE 2-3 GM-% IV SOLR
2.0000 g | Freq: Four times a day (QID) | INTRAVENOUS | Status: DC
  Administered 2013-03-02: 2 g via INTRAVENOUS
  Filled 2013-03-02 (×3): qty 50

## 2013-03-02 MED ORDER — PHENOL 1.4 % MT LIQD: 1.0000 | OROMUCOSAL | Status: DC | PRN

## 2013-03-02 MED ORDER — OXYCODONE HCL 5 MG PO TABS
ORAL_TABLET | ORAL | Status: AC
  Filled 2013-03-02: qty 1

## 2013-03-02 MED ORDER — BISACODYL 5 MG PO TBEC
5.0000 mg | DELAYED_RELEASE_TABLET | Freq: Every day | ORAL | Status: DC | PRN
  Administered 2013-03-03: 5 mg via ORAL
  Filled 2013-03-02: qty 1

## 2013-03-02 MED ORDER — MEPERIDINE HCL 25 MG/ML IJ SOLN: 6.2500 mg | INTRAMUSCULAR | Status: DC | PRN

## 2013-03-02 MED ORDER — CHLORHEXIDINE GLUCONATE 4 % EX LIQD: 60.0000 mL | Freq: Once | CUTANEOUS | Status: DC

## 2013-03-02 MED ORDER — MENTHOL 3 MG MT LOZG: 1.0000 | LOZENGE | OROMUCOSAL | Status: DC | PRN

## 2013-03-02 MED ORDER — OXYCODONE-ACETAMINOPHEN 5-325 MG PO TABS
1.0000 | ORAL_TABLET | ORAL | Status: DC | PRN
  Administered 2013-03-02: 2 via ORAL
  Administered 2013-03-03: 1 via ORAL
  Administered 2013-03-03 (×2): 2 via ORAL
  Filled 2013-03-02: qty 1
  Filled 2013-03-02 (×2): qty 2

## 2013-03-02 MED ORDER — HYDROMORPHONE HCL PF 1 MG/ML IJ SOLN
0.2500 mg | INTRAMUSCULAR | Status: DC | PRN
  Administered 2013-03-02 – 2013-03-03 (×2): 1 mg via INTRAVENOUS
  Filled 2013-03-02 (×2): qty 1

## 2013-03-02 MED ORDER — LACTATED RINGERS IV SOLN: INTRAVENOUS | Status: DC

## 2013-03-02 MED ORDER — SODIUM CHLORIDE 0.9 % IR SOLN
Status: DC | PRN
  Administered 2013-03-02: 1000 mL

## 2013-03-02 MED ORDER — ACETAMINOPHEN 650 MG RE SUPP: 650.0000 mg | Freq: Four times a day (QID) | RECTAL | Status: DC | PRN

## 2013-03-02 MED ORDER — MIDAZOLAM HCL 5 MG/5ML IJ SOLN
INTRAMUSCULAR | Status: DC | PRN
  Administered 2013-03-02: 2 mg via INTRAVENOUS

## 2013-03-02 MED ORDER — OXYCODONE HCL 5 MG PO TABS
5.0000 mg | ORAL_TABLET | Freq: Once | ORAL | Status: AC | PRN
  Administered 2013-03-02: 5 mg via ORAL

## 2013-03-02 MED ORDER — DABIGATRAN ETEXILATE MESYLATE 150 MG PO CAPS
150.0000 mg | ORAL_CAPSULE | Freq: Two times a day (BID) | ORAL | Status: DC
  Administered 2013-03-02 – 2013-03-03 (×2): 150 mg via ORAL
  Filled 2013-03-02 (×3): qty 1

## 2013-03-02 MED ORDER — HYDROMORPHONE HCL PF 1 MG/ML IJ SOLN
INTRAMUSCULAR | Status: AC
  Filled 2013-03-02: qty 1

## 2013-03-02 MED ORDER — ONDANSETRON HCL 4 MG/2ML IJ SOLN: 4.0000 mg | Freq: Four times a day (QID) | INTRAMUSCULAR | Status: DC | PRN

## 2013-03-02 MED ORDER — METHOCARBAMOL 500 MG PO TABS
ORAL_TABLET | ORAL | Status: AC
  Filled 2013-03-02: qty 1

## 2013-03-02 MED ORDER — FLEET ENEMA 7-19 GM/118ML RE ENEM: 1.0000 | ENEMA | Freq: Once | RECTAL | Status: AC | PRN

## 2013-03-02 MED ORDER — POLYETHYLENE GLYCOL 3350 17 G PO PACK: 17.0000 g | PACK | Freq: Every day | ORAL | Status: DC | PRN

## 2013-03-02 MED ORDER — OXYCODONE HCL 5 MG/5ML PO SOLN: 5.0000 mg | Freq: Once | ORAL | Status: AC | PRN

## 2013-03-02 MED ORDER — CEFAZOLIN SODIUM-DEXTROSE 2-3 GM-% IV SOLR
2.0000 g | Freq: Four times a day (QID) | INTRAVENOUS | Status: DC
  Administered 2013-03-02 – 2013-03-03 (×2): 2 g via INTRAVENOUS
  Filled 2013-03-02 (×3): qty 50

## 2013-03-02 MED ORDER — PROPOFOL 10 MG/ML IV BOLUS
INTRAVENOUS | Status: DC | PRN
  Administered 2013-03-02: 150 mg via INTRAVENOUS

## 2013-03-02 MED ORDER — PHENYLEPHRINE HCL 10 MG/ML IJ SOLN
INTRAMUSCULAR | Status: DC | PRN
  Administered 2013-03-02: 40 ug via INTRAVENOUS
  Administered 2013-03-02: 80 ug via INTRAVENOUS
  Administered 2013-03-02: 40 ug via INTRAVENOUS

## 2013-03-02 MED ORDER — ONDANSETRON HCL 4 MG PO TABS: 4.0000 mg | ORAL_TABLET | Freq: Four times a day (QID) | ORAL | Status: DC | PRN

## 2013-03-02 MED ORDER — METOCLOPRAMIDE HCL 10 MG PO TABS: 5.0000 mg | ORAL_TABLET | Freq: Three times a day (TID) | ORAL | Status: DC | PRN

## 2013-03-02 MED ORDER — AMLODIPINE BESYLATE 5 MG PO TABS
5.0000 mg | ORAL_TABLET | Freq: Every day | ORAL | Status: DC
  Administered 2013-03-03: 5 mg via ORAL
  Filled 2013-03-02: qty 1

## 2013-03-02 MED ORDER — DEXTROSE 5 % IV SOLN
500.0000 mg | Freq: Four times a day (QID) | INTRAVENOUS | Status: DC | PRN
  Filled 2013-03-02: qty 5

## 2013-03-02 MED ORDER — HYDROMORPHONE HCL PF 1 MG/ML IJ SOLN
0.2500 mg | INTRAMUSCULAR | Status: DC | PRN
  Administered 2013-03-02 (×2): 0.5 mg via INTRAVENOUS

## 2013-03-02 MED ORDER — ATORVASTATIN CALCIUM 40 MG PO TABS
40.0000 mg | ORAL_TABLET | Freq: Every day | ORAL | Status: DC
  Administered 2013-03-02: 40 mg via ORAL
  Filled 2013-03-02 (×2): qty 1

## 2013-03-02 MED ORDER — DOCUSATE SODIUM 100 MG PO CAPS
100.0000 mg | ORAL_CAPSULE | Freq: Two times a day (BID) | ORAL | Status: DC
  Administered 2013-03-02 – 2013-03-03 (×3): 100 mg via ORAL
  Filled 2013-03-02 (×4): qty 1

## 2013-03-02 MED ORDER — ONDANSETRON HCL 4 MG/2ML IJ SOLN
INTRAMUSCULAR | Status: DC | PRN
  Administered 2013-03-02: 4 mg via INTRAVENOUS

## 2013-03-02 MED ORDER — METHOCARBAMOL 500 MG PO TABS
500.0000 mg | ORAL_TABLET | Freq: Four times a day (QID) | ORAL | Status: DC | PRN
  Administered 2013-03-02 – 2013-03-03 (×2): 500 mg via ORAL
  Filled 2013-03-02 (×2): qty 1

## 2013-03-02 MED ORDER — AMIODARONE HCL 200 MG PO TABS
200.0000 mg | ORAL_TABLET | Freq: Every day | ORAL | Status: DC
  Administered 2013-03-03: 200 mg via ORAL
  Filled 2013-03-02: qty 1

## 2013-03-02 MED ORDER — ROCURONIUM BROMIDE 100 MG/10ML IV SOLN
INTRAVENOUS | Status: DC | PRN
  Administered 2013-03-02: 40 mg via INTRAVENOUS

## 2013-03-02 MED ORDER — DIPHENHYDRAMINE HCL 12.5 MG/5ML PO ELIX: 12.5000 mg | ORAL_SOLUTION | ORAL | Status: DC | PRN

## 2013-03-02 MED ORDER — ONDANSETRON HCL 4 MG/2ML IJ SOLN: 4.0000 mg | Freq: Once | INTRAMUSCULAR | Status: DC | PRN

## 2013-03-02 MED ORDER — NEOSTIGMINE METHYLSULFATE 1 MG/ML IJ SOLN
INTRAMUSCULAR | Status: DC | PRN
  Administered 2013-03-02: 4 mg via INTRAVENOUS

## 2013-03-02 MED ORDER — LACTATED RINGERS IV SOLN
INTRAVENOUS | Status: DC | PRN
  Administered 2013-03-02 (×2): via INTRAVENOUS

## 2013-03-02 MED ORDER — TEMAZEPAM 15 MG PO CAPS: 15.0000 mg | ORAL_CAPSULE | Freq: Every evening | ORAL | Status: DC | PRN

## 2013-03-02 MED ORDER — NITROGLYCERIN 0.4 MG SL SUBL: 0.4000 mg | SUBLINGUAL_TABLET | SUBLINGUAL | Status: DC | PRN

## 2013-03-02 MED ORDER — LACTATED RINGERS IV SOLN
INTRAVENOUS | Status: DC
  Administered 2013-03-02: 18:00:00 via INTRAVENOUS

## 2013-03-02 MED ORDER — DEXAMETHASONE SODIUM PHOSPHATE 4 MG/ML IJ SOLN
INTRAMUSCULAR | Status: DC | PRN
  Administered 2013-03-02: 4 mg

## 2013-03-02 MED ORDER — OXYCODONE-ACETAMINOPHEN 5-325 MG PO TABS
ORAL_TABLET | ORAL | Status: AC
  Filled 2013-03-02: qty 2

## 2013-03-02 MED ORDER — ACETAMINOPHEN 325 MG PO TABS: 650.0000 mg | ORAL_TABLET | Freq: Four times a day (QID) | ORAL | Status: DC | PRN

## 2013-03-02 SURGICAL SUPPLY — 62 items
BLADE SAW SGTL 83.5X18.5 (BLADE) ×2 IMPLANT
CEMENT BONE DEPUY (Cement) ×1 IMPLANT
CLOTH BEACON ORANGE TIMEOUT ST (SAFETY) ×2 IMPLANT
COVER SURGICAL LIGHT HANDLE (MISCELLANEOUS) ×2 IMPLANT
DRAPE INCISE IOBAN 66X45 STRL (DRAPES) ×2 IMPLANT
DRAPE SURG 17X11 SM STRL (DRAPES) ×2 IMPLANT
DRAPE SURG 17X23 STRL (DRAPES) ×2 IMPLANT
DRAPE U-SHAPE 47X51 STRL (DRAPES) ×2 IMPLANT
DRILL BIT 7/64X5 (BIT) IMPLANT
DRSG AQUACEL AG ADV 3.5X10 (GAUZE/BANDAGES/DRESSINGS) ×2 IMPLANT
DRSG AQUACEL AG ADV 3.5X14 (GAUZE/BANDAGES/DRESSINGS) ×1 IMPLANT
DRSG MEPILEX BORDER 4X8 (GAUZE/BANDAGES/DRESSINGS) ×2 IMPLANT
DURAPREP 26ML APPLICATOR (WOUND CARE) ×4 IMPLANT
ELECT BLADE 4.0 EZ CLEAN MEGAD (MISCELLANEOUS) ×2
ELECT CAUTERY BLADE 6.4 (BLADE) ×2 IMPLANT
ELECT REM PT RETURN 9FT ADLT (ELECTROSURGICAL) ×2
ELECTRODE BLDE 4.0 EZ CLN MEGD (MISCELLANEOUS) ×1 IMPLANT
ELECTRODE REM PT RTRN 9FT ADLT (ELECTROSURGICAL) ×1 IMPLANT
FACESHIELD LNG OPTICON STERILE (SAFETY) ×6 IMPLANT
GLENOID ANCHOR PEG CROSSLK 48 (Orthopedic Implant) ×1 IMPLANT
GLOVE BIO SURGEON STRL SZ7.5 (GLOVE) ×2 IMPLANT
GLOVE BIO SURGEON STRL SZ8 (GLOVE) ×2 IMPLANT
GLOVE EUDERMIC 7 POWDERFREE (GLOVE) ×2 IMPLANT
GLOVE SS BIOGEL STRL SZ 7.5 (GLOVE) ×1 IMPLANT
GLOVE SUPERSENSE BIOGEL SZ 7.5 (GLOVE) ×1
GOWN STRL NON-REIN LRG LVL3 (GOWN DISPOSABLE) ×2 IMPLANT
GOWN STRL REIN XL XLG (GOWN DISPOSABLE) ×4 IMPLANT
HEAD HUMERAL 52MMX18MM (Trauma) IMPLANT
HUMERAL HEAD 52MMX18MM (Trauma) ×2 IMPLANT
HUMERAL STEM 14MM (Trauma) ×1 IMPLANT
KIT BASIN OR (CUSTOM PROCEDURE TRAY) ×2 IMPLANT
KIT ROOM TURNOVER OR (KITS) ×2 IMPLANT
MANIFOLD NEPTUNE II (INSTRUMENTS) ×2 IMPLANT
NDL HYPO 25GX1X1/2 BEV (NEEDLE) IMPLANT
NDL SUT 6 .5 CRC .975X.05 MAYO (NEEDLE) ×1 IMPLANT
NEEDLE HYPO 25GX1X1/2 BEV (NEEDLE) IMPLANT
NEEDLE MAYO TAPER (NEEDLE) ×2
NS IRRIG 1000ML POUR BTL (IV SOLUTION) ×2 IMPLANT
PACK SHOULDER (CUSTOM PROCEDURE TRAY) ×2 IMPLANT
PAD ARMBOARD 7.5X6 YLW CONV (MISCELLANEOUS) ×4 IMPLANT
PASSER SUT SWANSON 36MM LOOP (INSTRUMENTS) IMPLANT
PIN METAGLENE 2.5 (PIN) ×1 IMPLANT
SLING ARM FOAM STRAP LRG (SOFTGOODS) IMPLANT
SLING ARM FOAM STRAP XLG (SOFTGOODS) ×2 IMPLANT
SMARTMIX MINI TOWER (MISCELLANEOUS) ×2
SPONGE LAP 18X18 X RAY DECT (DISPOSABLE) ×2 IMPLANT
SPONGE LAP 4X18 X RAY DECT (DISPOSABLE) ×2 IMPLANT
STRIP CLOSURE SKIN 1/2X4 (GAUZE/BANDAGES/DRESSINGS) ×2 IMPLANT
SUCTION FRAZIER TIP 10 FR DISP (SUCTIONS) ×2 IMPLANT
SUT BONE WAX W31G (SUTURE) IMPLANT
SUT FIBERWIRE #2 38 T-5 BLUE (SUTURE) ×6
SUT MNCRL AB 3-0 PS2 18 (SUTURE) ×2 IMPLANT
SUT VIC AB 1 CT1 27 (SUTURE) ×6
SUT VIC AB 1 CT1 27XBRD ANBCTR (SUTURE) ×3 IMPLANT
SUT VIC AB 2-0 CT1 27 (SUTURE) ×4
SUT VIC AB 2-0 CT1 TAPERPNT 27 (SUTURE) ×2 IMPLANT
SUTURE FIBERWR #2 38 T-5 BLUE (SUTURE) ×2 IMPLANT
SYR CONTROL 10ML LL (SYRINGE) IMPLANT
TOWEL OR 17X24 6PK STRL BLUE (TOWEL DISPOSABLE) ×2 IMPLANT
TOWEL OR 17X26 10 PK STRL BLUE (TOWEL DISPOSABLE) ×2 IMPLANT
TOWER SMARTMIX MINI (MISCELLANEOUS) ×1 IMPLANT
WATER STERILE IRR 1000ML POUR (IV SOLUTION) ×2 IMPLANT

## 2013-03-02 NOTE — H&P (Signed)
Evan Dorsey    Chief Complaint: oa left shoulder  HPI: The patient is a 79 y.o. male with endstage left shoulder OA  Past Medical History  Diagnosis Date  . Elevated PSA   . HLD (hyperlipidemia)   . Diverticulosis   . Gout   . Adenomatous polyp   . Myocardial infarction     silent- 20 yrs ago  . HTN (hypertension)   . Atrial fibrillation   . CAD (coronary artery disease)     a. remote CABG b. prior cardiac cath 2005 c. Lexiscan Myoview 07/08/12 w/o evidence of ischemia, EF 53%  . Bladder cancer 07/31/2011  . History of kidney stones   . GERD (gastroesophageal reflux disease)   . Arthritis     Past Surgical History  Procedure Laterality Date  . Coronary artery bypass graft  1993  . Orif acetabular fracture  2008    trimalleolar fracture  . Cardiovascular stress test  02/10/08    6 mets, poor tolerance, no chest pain   . Cardiac catheterization      total occlusion to LAD, LCX, RCA. LIMA patent, normal seq SVG to OM, severe stenosis in SVG to PDA, but collaterals to PLA from OM. RCA went to PDA. treated medically   . Transurethral resection of bladder tumor  07/31/2011    Procedure: TRANSURETHRAL RESECTION OF BLADDER TUMOR (TURBT);  Surgeon: Garnett Farm, MD;  Location: [facility];  Service: Urology;  Laterality: N/A;  with Gyrus    Family History  Problem Relation Age of Onset  . CAD Father 67  . CAD Brother 87  . CAD Brother 38    Social History:  reports that he has never smoked. He has never used smokeless tobacco. He reports that he does not drink alcohol or use illicit drugs.  Allergies: No Known Allergies  Medications Prior to Admission  Medication Sig Dispense Refill  . amiodarone (PACERONE) 200 MG tablet Take 200 mg by mouth daily.      Marland Kitchen amLODipine (NORVASC) 5 MG tablet Take 5 mg by mouth daily.      . calcium carbonate (TUMS - DOSED IN MG ELEMENTAL CALCIUM) 500 MG chewable tablet Chew 2 tablets by mouth daily.      . Cholecalciferol (VITAMIN D PO) Take 1  tablet by mouth daily.      . dabigatran ([name]) 150 MG CAPS capsule Take 150 mg by mouth every 12 (twelve) hours.      Marland Kitchen HYDROcodone-acetaminophen (NORCO/VICODIN) 5-325 MG per tablet Take 1 tablet by mouth every 6 (six) hours as needed for pain.       . Methylcellulose, Laxative, 500 MG TABS Take 2 tablets by mouth 2 (two) times daily.       . rosuvastatin (CRESTOR) 20 MG tablet Take 20 mg by mouth daily.       . nitroGLYCERIN (NITROSTAT) 0.4 MG SL tablet Place 1 tablet (0.4 mg total) under the tongue every 5 (five) minutes x 3 doses as needed for chest pain.  25 tablet  3     Physical Exam: left shoulder with painful and restricted motion as noted at recent office visit  Vitals  Temp:  [97 F (36.1 C)] 97 F (36.1 C) (09/18 0553) Pulse Rate:  [57] 57 (09/18 0553) Resp:  [20] 20 (09/18 0553) BP: (140)/(65) 140/65 mmHg (09/18 0553) SpO2:  [97 %] 97 % (09/18 0553)  Assessment/Plan  Impression: oa left shoulder   Plan of Action: Procedure(s): LEFT TOTAL SHOULDER ARTHROPLASTY  Jamielynn Wigley M 03/02/2013, 7:36 AM

## 2013-03-02 NOTE — Transfer of Care (Signed)
Immediate Anesthesia Transfer of Care Note  Patient: Evan Dorsey  Procedure(s) Performed: Procedure(s): LEFT TOTAL SHOULDER ARTHROPLASTY (Left)  Patient Location: PACU  Anesthesia Type:GA combined with regional for post-op pain  Level of Consciousness: awake, alert , oriented and patient cooperative  Airway & Oxygen Therapy: Patient Spontanous Breathing and Patient connected to nasal cannula oxygen  Post-op Assessment: Report given to PACU RN, Post -op Vital signs reviewed and stable and Patient moving all extremities X 4  Post vital signs: Reviewed and stable  Complications: No apparent anesthesia complications

## 2013-03-02 NOTE — Preoperative (Signed)
Beta Blockers   Reason not to administer Beta Blockers:Not Applicable 

## 2013-03-02 NOTE — Anesthesia Preprocedure Evaluation (Addendum)
Anesthesia Evaluation  Patient identified by MRN, date of birth, ID band Patient awake    Reviewed: Allergy & Precautions, H&P , NPO status , Patient's Chart, lab work & pertinent test results  Airway Mallampati: II TM Distance: >3 FB Neck ROM: Full    Dental  (+) Edentulous Upper and Dental Advisory Given   Pulmonary          Cardiovascular hypertension, Pt. on medications + CAD and + Past MI + dysrhythmias Atrial Fibrillation     Neuro/Psych    GI/Hepatic GERD-  Medicated and Controlled,  Endo/Other  Morbid obesity  Renal/GU      Musculoskeletal   Abdominal   Peds  Hematology   Anesthesia Other Findings   Reproductive/Obstetrics                          Anesthesia Physical Anesthesia Plan  ASA: III  Anesthesia Plan: General and Regional   Post-op Pain Management:    Induction: Intravenous  Airway Management Planned: Oral ETT  Additional Equipment:   Intra-op Plan:   Post-operative Plan: Extubation in OR  Informed Consent: I have reviewed the patients History and Physical, chart, labs and discussed the procedure including the risks, benefits and alternatives for the proposed anesthesia with the patient or authorized representative who has indicated his/her understanding and acceptance.   Dental advisory given  Plan Discussed with: CRNA and Surgeon  Anesthesia Plan Comments:        Anesthesia Quick Evaluation

## 2013-03-02 NOTE — Anesthesia Postprocedure Evaluation (Signed)
Anesthesia Post Note  Patient: Evan Dorsey  Procedure(s) Performed: Procedure(s) (LRB): LEFT TOTAL SHOULDER ARTHROPLASTY (Left)  Anesthesia type: general  Patient location: PACU  Post pain: Pain level controlled  Post assessment: Patient's Cardiovascular Status Stable  Last Vitals:  Filed Vitals:   03/02/13 1104  BP: 123/72  Pulse: 70  Temp:   Resp: 17    Post vital signs: Reviewed and stable  Level of consciousness: sedated  Complications: No apparent anesthesia complications

## 2013-03-02 NOTE — Progress Notes (Signed)
Utilization review completed.  

## 2013-03-02 NOTE — Progress Notes (Signed)
Report  Given to Wyatt Mage RN as primary caregiver/holding RN

## 2013-03-02 NOTE — Progress Notes (Signed)
Care of pt assumed by MA Alen Matheson RN 

## 2013-03-02 NOTE — Anesthesia Procedure Notes (Addendum)
Anesthesia Regional Block:  Interscalene brachial plexus block  Pre-Anesthetic Checklist: ,, timeout performed, Correct Patient, Correct Site, Correct Laterality, Correct Procedure, Correct Position, site marked, Risks and benefits discussed,  Surgical consent,  Pre-op evaluation,  At surgeon's request and post-op pain management  Laterality: Left  Prep: chloraprep       Needles:  Injection technique: Single-shot  Needle Type: Echogenic Stimulator Needle     Needle Length: 5cm 5 cm Needle Gauge: 22 and 22 G    Additional Needles:  Procedures: ultrasound guided (picture in chart) and nerve stimulator Interscalene brachial plexus block  Nerve Stimulator or Paresthesia:  Response: bicep contraction, 0.45 mA,   Additional Responses:   Narrative:  Start time: 03/02/2013 7:14 AM End time: 03/02/2013 7:24 AM Injection made incrementally with aspirations every 5 mL.  Performed by: Personally  Anesthesiologist: J. Adonis Huguenin, MD  Additional Notes: Functioning IV was confirmed and monitors applied.  A 50mm 22ga echogenic arrow stimulator was used. Sterile prep and drape,hand hygiene and sterile gloves were used.Ultrasound guidance: relevant anatomy identified, needle position confirmed, local anesthetic spread visualized around nerve(s)., vascular puncture avoided.  Image printed for medical record.  Negative aspiration and negative test dose prior to incremental administration of local anesthetic. The patient tolerated the procedure well.  Interscalene brachial plexus block Procedure Name: Intubation Date/Time: 03/02/2013 7:50 AM Performed by: Rogelia Boga Pre-anesthesia Checklist: Patient identified, Emergency Drugs available, Suction available, Patient being monitored and Timeout performed Patient Re-evaluated:Patient Re-evaluated prior to inductionOxygen Delivery Method: Circle system utilized Preoxygenation: Pre-oxygenation with 100% oxygen Intubation Type: IV  induction Ventilation: Mask ventilation without difficulty Laryngoscope Size: Mac and 4 Grade View: Grade II Tube type: Oral Tube size: 7.5 mm Number of attempts: 1 Airway Equipment and Method: Stylet Placement Confirmation: ETT inserted through vocal cords under direct vision,  positive ETCO2 and breath sounds checked- equal and bilateral Secured at: 21 cm Tube secured with: Tape Dental Injury: Teeth and Oropharynx as per pre-operative assessment

## 2013-03-02 NOTE — Progress Notes (Signed)
COYT GOVONI [id number] Admitted to 5N12: 03/02/2013 3:15 PM Attending Provider: Senaida Lange, MD    Evan Dorsey is a 79 y.o. male patient admitted from ED awake, alert  & orientated  X 3,  Prior, VSS - Blood pressure 105/61, pulse 75, temperature 97.5 F (36.4 C), temperature source Oral, resp. rate 18, height 5' 10.87" (1.8 m), weight 102.2 kg (225 lb 5 oz), SpO2 93.00%., O2    2 L nasal cannular, no c/o shortness of breath, no c/o chest pain, no distress noted.  placed and pt is currently running:normal sinus rhythm.   IV site WDL:  wrist right, condition patent and no redness with a transparent dsg that's clean dry and intact.  Allergies:  No Known Allergies   Past Medical History  Diagnosis Date  . Elevated PSA   . HLD (hyperlipidemia)   . Diverticulosis   . Gout   . Adenomatous polyp   . Myocardial infarction     silent- 20 yrs ago  . HTN (hypertension)   . Atrial fibrillation   . CAD (coronary artery disease)     a. remote CABG b. prior cardiac cath 2005 c. Lexiscan Myoview 07/08/12 w/o evidence of ischemia, EF 53%  . Bladder cancer 07/31/2011  . History of kidney stones   . GERD (gastroesophageal reflux disease)   . Arthritis     Pt orientation to unit, room and routine. Information packet given to patient/family and safety video watched.  Admission INP armband ID verified with patient/family, and in place. SR up x 2, fall risk assessment complete with Patient and family verbalizing understanding of risks associated with falls. Pt verbalizes an understanding of how to use the call bell and to call for help before getting out of bed.  Skin, clean-dry- intact without evidence of bruising, or skin tears.   No evidence of skin break down noted on exam.    Will cont to monitor and assist as needed.  Ryelan, Kazee, RN 03/02/2013 3:15 PM

## 2013-03-03 MED ORDER — OXYCODONE-ACETAMINOPHEN 5-325 MG PO TABS: 1.0000 | ORAL_TABLET | ORAL | Status: DC | PRN

## 2013-03-03 NOTE — Op Note (Signed)
NAME:  Evan Dorsey, Evan Dorsey             ACCOUNT NO.:  [id number]  MEDICAL RECORD NO.:  [id number]  LOCATION:  [facility]                         FACILITY:  [facility]  PHYSICIAN:  Vania Rea. Rayvon Brandvold, M.D.  DATE OF BIRTH:  May 19, 1933  DATE OF PROCEDURE:  03/02/2013 DATE OF DISCHARGE:                              OPERATIVE REPORT   PREOPERATIVE DIAGNOSIS:  End-stage left shoulder osteoarthritis.  POSTOPERATIVE DIAGNOSIS:  End-stage left shoulder osteoarthritis.  PROCEDURE:  Left total shoulder arthroplasty utilizing a press-fit size 14 DePuy global stem with a 52 x 18 eccentric head and a cemented pegged 48 glenoid.  SURGEON:  Vania Rea. Starlit Raburn, MD  ASSISTANT:  Lucita Lora. Shuford, PA-C  ANESTHESIA:  General endotracheal as well as interscalene block.  ESTIMATED BLOOD LOSS:  250 mL.  DRAINS:  None.  HISTORY:  Evan Dorsey is a 79 year old gentleman, who has had chronic and progressively increasing left shoulder pain with a preop examination showing a markedly restricted and painful arc of motion with fair strength in testing the rotator cuff musculature with radiographs showing end-stage arthrosis with complete loss of joint space, subchondral sclerosis, bony deformity, and peripheral osteophyte formation, all characteristic of end-stage glenohumeral arthrosis.  Due to his ongoing pain and functional limitations, he is brought to the operating room at this time for planned left total shoulder arthroplasty.  Preoperatively, I counseled Evan Dorsey on treatment options as well as risks versus benefits thereof.  Possible surgical complications were reviewed including the potential for bleeding, infection, neurovascular injury, persistent pain, loss of motion, anesthetic complication, failure of the implant, and possible need for additional surgery.  He understands and accepts and agrees with our planned procedure.  PROCEDURE IN DETAIL:  After undergoing routine preop evaluation, the patient  received prophylactic antibiotics and an interscalene block was established in the holding area by the Anesthesia Department.  Placed supine on the operative table, underwent smooth induction of general endotracheal anesthesia.  Placed into beach-chair position and appropriately padded and protected.  Left shoulder girdle region was then sterilely prepped and draped in standard fashion.  Time-out was called.  An anterior approach to the left shoulder was made through a 15 cm anterior incision along the deltopectoral interval.  Skin flaps were elevated.  Electrocautery was used for hemostasis.  The deltopectoral interval was identified and developed bluntly.  Upper centimeters of the pec major was tenotomized to enhance visualization and adhesions in the subacromial/subdeltoid bursal space were all divided bluntly and then electrocautery was used for hemostasis.  We then identified and mobilized the conjoined tendon which was then retracted medially.  Self- retaining retractors were placed.  I unroofed the bicipital groove, tenotomized the biceps tendon for later tenodesis, and this biceps tendon was markedly inflamed with __________ peritenosynovitis and marked synovial hypertrophy.  We then split the rotator interval to the base of the coracoid and then divided the subscap from its lesser tuberosity insertion leaving a 1 cm cuff of tissue for later repair. The free edge was tagged with nonabsorbable sutures.  The anterior humeral circumflex vessels were electrocoagulated and the anterior inferior capsule release was then completed allowing delivery of the head and releasing the capsule back past the 7 o'clock  position to allow complete delivery of the head through the wound and care was taken to protect the rotator cuff superiorly and posteriorly.  The extramedullary guide was then used to outline our proposed humeral head resection, which was then performed with an oscillating saw and  the head was taken on back table and measured at approximately 52 arc for sizing.  I then performed hand reaming of the humeral canal up to size 14.  We then used a series of broaches to broach up to size 14.  Again this was all performed maintaining the native retroversion which was approximately 30 degrees.  We then removed the anterior and inferior osteophytes from the humeral neck with combination of rongeur and osteotomes.  Once this was completed, metal cap was placed on the cut surface of the proximal humerus.  We then exposed the glenoid with combination of Fukuda, snake tongue, and pitchfork retractors.  Performed a circumferential labral release removing the proximal stump of the biceps and labral tissues for circumferential exposure.  The glenoid was markedly eroded.  We mobilized the subscapularis as well.  The guidepin was then placed into the center of the glenoid.  This was reamed with the 48 reamer to subchondral bony bed.  There were some significant cystic changes in the subcondylar region in the center of the glenoid but the peripheral aspect of glenoid was nicely intact and contained.  We placed our central drill hole and then using the appropriate guide, made the peripheral peg holes.  The 48 trial glenoid showed good fit.  At this point, the glenoid was irrigated, dried, cement was mixed and at the appropriate consistency, it was introduced into the peripheral peg holes.  Our final 48 glenoid was then impacted into position with good fixation and cement was allowed to harden and then our attention was returned to the proximal humerus where we irrigated the canal, chose the stem and inserted the stem partially and then packed abundant bone from the resected humeral head about the metaphyseal portion of the implant and then performed final seating maintaining approximately 30 degrees of retroversion with excellent fit and fixation of the implant with good stability.   We then performed some trial reductions and the 52 x 18 eccentric head showed excellent coverage of the proximal humerus and good soft tissue balance with 50% translation on the glenoid.  The final 52 x 18 eccentric head was then impacted into position after cleaning the [facility] taper.  Final reduction was performed with overall soft tissue balance much to our satisfaction.  The subscapularis was then repaired back to the lesser tuberosity through the soft tissue cuff using #2 FiberWire.  The rotator interval was closed with figure-of-eight FiberWire and the inferior aspect of the subscap also reinforced with an additional figure-of-eight FiberWire suture.  The overall soft tissue balance was much to our satisfaction.  I should mention the joint was copiously irrigated prior to final closure.  Hemostasis was obtained. The biceps tendon was then tenodesed at the upper level of the pec major and residual __________ then removed.  The wound was irrigated.  The deltopectoral interval was reapproximated with figure-of-eight 0 Vicryl sutures.  A 2-0 Vicryl used for the subcu layer and intracuticular 3-0 Monocryl for the skin followed by Steri-Strips.  Dry dressing was applied.  Left arm was placed in a sling.  The patient was awakened, extubated, and taken to recovery room in stable condition.     Vania Rea. Icker Swigert, M.D.  KMS/MEDQ  D:  03/02/2013  T:  03/02/2013  Job:  [id number]

## 2013-03-03 NOTE — Discharge Summary (Signed)
PATIENT ID:      Evan Dorsey  MRN:     [id number] DOB/AGE:    09/15/1932 / 79 y.o.     DISCHARGE SUMMARY  ADMISSION DATE:    03/02/2013 DISCHARGE DATE:    ADMISSION DIAGNOSIS: oa left shoulder  Past Medical History  Diagnosis Date  . Elevated PSA   . HLD (hyperlipidemia)   . Diverticulosis   . Gout     "been a long time ago" (03/02/2013)  . Adenomatous polyp   . HTN (hypertension)   . CAD (coronary artery disease)     a. remote CABG b. prior cardiac cath 2005 c. Lexiscan Myoview 07/08/12 w/o evidence of ischemia, EF 53%  . Bladder cancer 07/31/2011  . GERD (gastroesophageal reflux disease)   . Myocardial infarction 1990's    silent  . Atrial fibrillation   . Kidney stones     "passed all except one; they had to go get that one" (03/02/2013)  . Arthritis     "left shoulder" (03/02/2013)  . Frequent urination     "day and night" (03/02/2013)    DISCHARGE DIAGNOSIS:   Active Problems:   * No active hospital problems. *   PROCEDURE: Procedure(s): LEFT TOTAL SHOULDER ARTHROPLASTY on 03/02/2013  CONSULTS:   none  HISTORY:  See H&P in chart.  HOSPITAL COURSE:  Evan Dorsey is a 79 y.o. admitted on 03/02/2013 with a chief complaint of left shoulder pain, and found to have a diagnosis of oa left shoulder .  They were brought to the operating room on 03/02/2013 and underwent Procedure(s): LEFT TOTAL SHOULDER ARTHROPLASTY.    They were given perioperative antibiotics: Anti-infectives   Start     Dose/Rate Route Frequency Ordered Stop   03/02/13 2230  ceFAZolin (ANCEF) IVPB 2 g/50 mL premix     2 g 100 mL/hr over 30 Minutes Intravenous Every 6 hours 03/02/13 2216 03/03/13 1629   03/02/13 1530  ceFAZolin (ANCEF) IVPB 2 g/50 mL premix  Status:  Discontinued     2 g 100 mL/hr over 30 Minutes Intravenous Every 6 hours 03/02/13 1432 03/02/13 2216   03/02/13 0600  ceFAZolin (ANCEF) IVPB 2 g/50 mL premix     2 g 100 mL/hr over 30 Minutes Intravenous On call to O.R. 03/01/13  1440 03/02/13 0752    .  Patient underwent the above named procedure and tolerated it well. The following day they were hemodynamically stable and pain was controlled on oral analgesics. They were neurovascularly intact to the operative extremity. OT was ordered and worked with patient per protocol. They were medically and orthopaedically stable for discharge on Day 1.   DIAGNOSTIC STUDIES:  RECENT RADIOGRAPHIC STUDIES :  Dg Chest 2 View  02/09/2013   *RADIOLOGY REPORT*  Clinical Data: Follow-up abnormal chest x-ray  CHEST - 2 VIEW  Comparison: 12/01/2012  Findings: Cardiomediastinal silhouette is stable.  Again noted status post median sternotomy.  No acute infiltrate or pleural effusion.  Degenerative changes thoracic spine again noted.  Stable bilateral basilar atelectasis or scarring right greater than left.  IMPRESSION: No active disease.  Stable bilateral basilar atelectasis or scarring right greater than left.   Original Report Authenticated By: Natasha Mead, M.D.    RECENT VITAL SIGNS:  Patient Vitals for the past 24 hrs:  BP Temp Temp src Pulse Resp SpO2 Height Weight  03/03/13 0534 100/60 mmHg 98.8 F (37.1 C) - 75 16 91 % - -  03/02/13 2042 113/63 mmHg 98.3 F (36.8  C) - 73 16 91 % - -  03/02/13 1900 115/66 mmHg 98 F (36.7 C) - 70 16 - - -  03/02/13 1755 118/64 mmHg 98 F (36.7 C) - 73 18 - - -  03/02/13 1701 110/58 mmHg 97.6 F (36.4 C) - 76 19 - - -  03/02/13 1600 108/60 mmHg 97.3 F (36.3 C) - 68 17 - - -  03/02/13 1438 105/61 mmHg 97.5 F (36.4 C) Oral 75 18 93 % 5' 10.87" (1.8 m) 102.2 kg (225 lb 5 oz)  03/02/13 1400 120/66 mmHg 97.9 F (36.6 C) - 62 16 99 % - -  03/02/13 1252 162/82 mmHg - - 63 14 100 % - -  03/02/13 1245 157/80 mmHg - - 63 19 100 % - -  03/02/13 1215 - - - 66 12 93 % - -  03/02/13 1203 125/68 mmHg - - - - - - -  03/02/13 1157 - - - 72 14 92 % - -  03/02/13 1125 - - - 66 15 92 % - -  03/02/13 1118 124/66 mmHg - - - - - - -  03/02/13 1104 123/72  mmHg - - 70 17 93 % - -  03/02/13 1100 - - - 67 13 93 % - -  03/02/13 1048 124/66 mmHg - - - - - - -  03/02/13 1045 - - - 69 8 93 % - -  03/02/13 1033 124/72 mmHg - - - - - - -  03/02/13 1025 - - - 73 11 92 % - -  03/02/13 1018 129/70 mmHg - - - - - - -  03/02/13 1015 - - - 76 12 93 % - -  03/02/13 1002 123/67 mmHg 97.8 F (36.6 C) - 81 13 91 % - -  03/02/13 1001 - - - 81 16 90 % - -  .  RECENT EKG RESULTS:    Orders placed during the hospital encounter of 02/24/13  . EKG 12-LEAD  . EKG 12-LEAD    DISCHARGE INSTRUCTIONS:    DISCHARGE MEDICATIONS:     Medication List    STOP taking these medications       HYDROcodone-acetaminophen 5-325 MG per tablet  Commonly known as:  NORCO/VICODIN      TAKE these medications       amiodarone 200 MG tablet  Commonly known as:  PACERONE  Take 200 mg by mouth daily.     amLODipine 5 MG tablet  Commonly known as:  NORVASC  Take 5 mg by mouth daily.     calcium carbonate 500 MG chewable tablet  Commonly known as:  TUMS - dosed in mg elemental calcium  Chew 2 tablets by mouth daily.     dabigatran 150 MG Caps capsule  Commonly known as:  [name]  Take 150 mg by mouth every 12 (twelve) hours.     Methylcellulose (Laxative) 500 MG Tabs  Take 2 tablets by mouth 2 (two) times daily.     nitroGLYCERIN 0.4 MG SL tablet  Commonly known as:  NITROSTAT  Place 1 tablet (0.4 mg total) under the tongue every 5 (five) minutes x 3 doses as needed for chest pain.     oxyCODONE-acetaminophen 5-325 MG per tablet  Commonly known as:  PERCOCET  Take 1-2 tablets by mouth every 4 (four) hours as needed for pain.     rosuvastatin 20 MG tablet  Commonly known as:  CRESTOR  Take 20 mg by mouth daily.  VITAMIN D PO  Take 1 tablet by mouth daily.        FOLLOW UP VISIT:       Follow-up Information   Follow up with Senaida Lange, MD. (call to be seen in 10-14 days)    Specialty:  Orthopedic Surgery   Contact information:   [address] [place] [address] [phone]       DISCHARGE [id number]:[id number]  DISPOSITION: good  DISCHARGE CONDITION:  Rodolph Bong for Dr. Francena Hanly 03/03/2013, 7:49 AM

## 2013-03-03 NOTE — Evaluation (Signed)
Occupational Therapy Evaluation and Discharge Patient Details Name: Evan Dorsey MRN: [id number] DOB: Aug 29, 1932 Today's Date: 03/03/2013 Time: 4098-1191 OT Time Calculation (min): 45 min  OT Assessment / Plan / Recommendation History of present illness LTSA   Clinical Impression   This 79 yo male s/p above presents to acute OT with all education completed. Will D/C from acute OT with follow up for shoulder per MD.    OT Assessment  Progress rehab of shoulder as ordered by MD at follow-up appointment    Follow Up Recommendations  No OT follow up       Equipment Recommendations  None recommended by OT          Precautions / Restrictions Precautions Precautions: Shoulder Shoulder Interventions: Shoulder sling/immobilizer;Off for dressing/bathing/exercises Required Braces or Orthoses: Sling Restrictions Weight Bearing Restrictions: Yes LUE Weight Bearing: Non weight bearing   Pertinent Vitals/Pain 10/10 with exercises; RN called and gave IV meds        Visit Information  Last OT Received On: 03/03/13 Assistance Needed: +1 History of Present Illness: LTSA       Prior Functioning     Home Living Family/patient expects to be discharged to:: Private residence Living Arrangements: Spouse/significant other Available Help at Discharge: Family;Available 24 hours/day Prior Function Level of Independence: Independent Communication Communication: No difficulties Dominant Hand: Right            Cognition  Cognition Arousal/Alertness: Awake/alert Behavior During Therapy: WFL for tasks assessed/performed Overall Cognitive Status: Within Functional Limits for tasks assessed    Extremity/Trunk Assessment Upper Extremity Assessment Upper Extremity Assessment: LUE deficits/detail LUE Deficits / Details: Due to TSA; elbow to hand WNL LUE Coordination: decreased gross motor        Exercise Other Exercises Other Exercises: All exercises 10 repetitions:   Pendulums clockwise, counterclockwise, forward/backward, side to side. Supine PROM shoulder flexion with other arm assisting and A of wife with noted on handout to not go greater than 90 degrees of flexion.  Supine PROM shoulder external rotation with cane--pt only able to get to neutral; aware he should not go beyond 30 degrees external rotation. Sitting shoulder abduction (rocking the baby) pt able to get about 20 degrees, aware he should not go beyond 60 degrees. Donning/doffing shirt without moving shoulder: Caregiver independent with task Method for sponge bathing under operated UE: Modified independent Donning/doffing sling/immobilizer: Caregiver independent with task Correct positioning of sling/immobilizer: Independent Pendulum exercises (written home exercise program): Supervision/safety ROM for elbow, wrist and digits of operated UE: Independent Sling wearing schedule (on at all times/off for ADL's): Independent Proper positioning of operated UE when showering: Independent Dressing change:  (N/A) Positioning of UE while sleeping: Set-up      End of Session OT - End of Session Activity Tolerance: Patient tolerated treatment well Patient left: in chair;with call bell/phone within reach;with family/visitor present       Evette Georges [phone] 03/03/2013, 2:25 PM

## 2013-03-03 NOTE — Progress Notes (Signed)
Dressing changed according to PA instructions. RN reviewed discharge instructions with patient and wife. All questions answered. Prescriptions given to patient. Discharged with sling in place and NT wheeled patient to car.

## 2013-03-05 ENCOUNTER — Encounter (HOSPITAL_COMMUNITY): Payer: Self-pay | Admitting: Orthopedic Surgery

## 2013-03-13 DIAGNOSIS — Z96619 Presence of unspecified artificial shoulder joint: Secondary | ICD-10-CM | POA: Diagnosis not present

## 2013-03-17 DIAGNOSIS — M79609 Pain in unspecified limb: Secondary | ICD-10-CM | POA: Diagnosis not present

## 2013-03-17 DIAGNOSIS — R197 Diarrhea, unspecified: Secondary | ICD-10-CM | POA: Diagnosis not present

## 2013-03-23 DIAGNOSIS — Z96619 Presence of unspecified artificial shoulder joint: Secondary | ICD-10-CM | POA: Diagnosis not present

## 2013-03-23 DIAGNOSIS — IMO0001 Reserved for inherently not codable concepts without codable children: Secondary | ICD-10-CM | POA: Diagnosis not present

## 2013-03-23 DIAGNOSIS — M25519 Pain in unspecified shoulder: Secondary | ICD-10-CM | POA: Diagnosis not present

## 2013-03-27 DIAGNOSIS — Z96619 Presence of unspecified artificial shoulder joint: Secondary | ICD-10-CM | POA: Diagnosis not present

## 2013-03-27 DIAGNOSIS — M25519 Pain in unspecified shoulder: Secondary | ICD-10-CM | POA: Diagnosis not present

## 2013-03-27 DIAGNOSIS — IMO0001 Reserved for inherently not codable concepts without codable children: Secondary | ICD-10-CM | POA: Diagnosis not present

## 2013-03-29 DIAGNOSIS — Z96619 Presence of unspecified artificial shoulder joint: Secondary | ICD-10-CM | POA: Diagnosis not present

## 2013-03-29 DIAGNOSIS — M25519 Pain in unspecified shoulder: Secondary | ICD-10-CM | POA: Diagnosis not present

## 2013-03-29 DIAGNOSIS — IMO0001 Reserved for inherently not codable concepts without codable children: Secondary | ICD-10-CM | POA: Diagnosis not present

## 2013-04-03 DIAGNOSIS — Z96619 Presence of unspecified artificial shoulder joint: Secondary | ICD-10-CM | POA: Diagnosis not present

## 2013-04-03 DIAGNOSIS — M25519 Pain in unspecified shoulder: Secondary | ICD-10-CM | POA: Diagnosis not present

## 2013-04-03 DIAGNOSIS — IMO0001 Reserved for inherently not codable concepts without codable children: Secondary | ICD-10-CM | POA: Diagnosis not present

## 2013-04-05 DIAGNOSIS — M25519 Pain in unspecified shoulder: Secondary | ICD-10-CM | POA: Diagnosis not present

## 2013-04-05 DIAGNOSIS — IMO0001 Reserved for inherently not codable concepts without codable children: Secondary | ICD-10-CM | POA: Diagnosis not present

## 2013-04-05 DIAGNOSIS — Z96619 Presence of unspecified artificial shoulder joint: Secondary | ICD-10-CM | POA: Diagnosis not present

## 2013-04-10 DIAGNOSIS — IMO0001 Reserved for inherently not codable concepts without codable children: Secondary | ICD-10-CM | POA: Diagnosis not present

## 2013-04-10 DIAGNOSIS — M25519 Pain in unspecified shoulder: Secondary | ICD-10-CM | POA: Diagnosis not present

## 2013-04-10 DIAGNOSIS — Z96619 Presence of unspecified artificial shoulder joint: Secondary | ICD-10-CM | POA: Diagnosis not present

## 2013-04-12 DIAGNOSIS — Z96619 Presence of unspecified artificial shoulder joint: Secondary | ICD-10-CM | POA: Diagnosis not present

## 2013-04-12 DIAGNOSIS — M25519 Pain in unspecified shoulder: Secondary | ICD-10-CM | POA: Diagnosis not present

## 2013-04-12 DIAGNOSIS — IMO0001 Reserved for inherently not codable concepts without codable children: Secondary | ICD-10-CM | POA: Diagnosis not present

## 2013-04-19 DIAGNOSIS — M25519 Pain in unspecified shoulder: Secondary | ICD-10-CM | POA: Diagnosis not present

## 2013-04-19 DIAGNOSIS — Z96619 Presence of unspecified artificial shoulder joint: Secondary | ICD-10-CM | POA: Diagnosis not present

## 2013-04-19 DIAGNOSIS — IMO0001 Reserved for inherently not codable concepts without codable children: Secondary | ICD-10-CM | POA: Diagnosis not present

## 2013-04-21 DIAGNOSIS — Z96619 Presence of unspecified artificial shoulder joint: Secondary | ICD-10-CM | POA: Diagnosis not present

## 2013-04-21 DIAGNOSIS — M25519 Pain in unspecified shoulder: Secondary | ICD-10-CM | POA: Diagnosis not present

## 2013-04-21 DIAGNOSIS — IMO0001 Reserved for inherently not codable concepts without codable children: Secondary | ICD-10-CM | POA: Diagnosis not present

## 2013-04-24 DIAGNOSIS — H26499 Other secondary cataract, unspecified eye: Secondary | ICD-10-CM | POA: Diagnosis not present

## 2013-04-26 DIAGNOSIS — M25519 Pain in unspecified shoulder: Secondary | ICD-10-CM | POA: Diagnosis not present

## 2013-04-26 DIAGNOSIS — Z96619 Presence of unspecified artificial shoulder joint: Secondary | ICD-10-CM | POA: Diagnosis not present

## 2013-05-01 DIAGNOSIS — Z96619 Presence of unspecified artificial shoulder joint: Secondary | ICD-10-CM | POA: Diagnosis not present

## 2013-05-01 DIAGNOSIS — M25519 Pain in unspecified shoulder: Secondary | ICD-10-CM | POA: Diagnosis not present

## 2013-05-03 DIAGNOSIS — Z96619 Presence of unspecified artificial shoulder joint: Secondary | ICD-10-CM | POA: Diagnosis not present

## 2013-05-03 DIAGNOSIS — M25519 Pain in unspecified shoulder: Secondary | ICD-10-CM | POA: Diagnosis not present

## 2013-05-05 DIAGNOSIS — M25519 Pain in unspecified shoulder: Secondary | ICD-10-CM | POA: Diagnosis not present

## 2013-05-05 DIAGNOSIS — Z96619 Presence of unspecified artificial shoulder joint: Secondary | ICD-10-CM | POA: Diagnosis not present

## 2013-05-08 DIAGNOSIS — M25519 Pain in unspecified shoulder: Secondary | ICD-10-CM | POA: Diagnosis not present

## 2013-05-08 DIAGNOSIS — Z96619 Presence of unspecified artificial shoulder joint: Secondary | ICD-10-CM | POA: Diagnosis not present

## 2013-05-10 DIAGNOSIS — Z96619 Presence of unspecified artificial shoulder joint: Secondary | ICD-10-CM | POA: Diagnosis not present

## 2013-05-10 DIAGNOSIS — M25519 Pain in unspecified shoulder: Secondary | ICD-10-CM | POA: Diagnosis not present

## 2013-05-16 DIAGNOSIS — Z96619 Presence of unspecified artificial shoulder joint: Secondary | ICD-10-CM | POA: Diagnosis not present

## 2013-05-16 DIAGNOSIS — M25519 Pain in unspecified shoulder: Secondary | ICD-10-CM | POA: Diagnosis not present

## 2013-05-17 DIAGNOSIS — Z96619 Presence of unspecified artificial shoulder joint: Secondary | ICD-10-CM | POA: Diagnosis not present

## 2013-05-17 DIAGNOSIS — M25519 Pain in unspecified shoulder: Secondary | ICD-10-CM | POA: Diagnosis not present

## 2013-05-19 DIAGNOSIS — M25519 Pain in unspecified shoulder: Secondary | ICD-10-CM | POA: Diagnosis not present

## 2013-05-19 DIAGNOSIS — Z96619 Presence of unspecified artificial shoulder joint: Secondary | ICD-10-CM | POA: Diagnosis not present

## 2013-05-22 ENCOUNTER — Other Ambulatory Visit: Payer: Self-pay | Admitting: Cardiology

## 2013-05-22 DIAGNOSIS — IMO0001 Reserved for inherently not codable concepts without codable children: Secondary | ICD-10-CM | POA: Diagnosis not present

## 2013-05-22 DIAGNOSIS — Z96619 Presence of unspecified artificial shoulder joint: Secondary | ICD-10-CM | POA: Diagnosis not present

## 2013-05-22 DIAGNOSIS — M25519 Pain in unspecified shoulder: Secondary | ICD-10-CM | POA: Diagnosis not present

## 2013-05-22 DIAGNOSIS — M549 Dorsalgia, unspecified: Secondary | ICD-10-CM | POA: Diagnosis not present

## 2013-05-24 DIAGNOSIS — Z96619 Presence of unspecified artificial shoulder joint: Secondary | ICD-10-CM | POA: Diagnosis not present

## 2013-05-24 DIAGNOSIS — M549 Dorsalgia, unspecified: Secondary | ICD-10-CM | POA: Diagnosis not present

## 2013-05-24 DIAGNOSIS — M25519 Pain in unspecified shoulder: Secondary | ICD-10-CM | POA: Diagnosis not present

## 2013-05-24 DIAGNOSIS — IMO0001 Reserved for inherently not codable concepts without codable children: Secondary | ICD-10-CM | POA: Diagnosis not present

## 2013-05-25 DIAGNOSIS — H903 Sensorineural hearing loss, bilateral: Secondary | ICD-10-CM | POA: Diagnosis not present

## 2013-05-25 DIAGNOSIS — H9319 Tinnitus, unspecified ear: Secondary | ICD-10-CM | POA: Diagnosis not present

## 2013-05-26 DIAGNOSIS — IMO0001 Reserved for inherently not codable concepts without codable children: Secondary | ICD-10-CM | POA: Diagnosis not present

## 2013-05-26 DIAGNOSIS — M549 Dorsalgia, unspecified: Secondary | ICD-10-CM | POA: Diagnosis not present

## 2013-05-26 DIAGNOSIS — M25519 Pain in unspecified shoulder: Secondary | ICD-10-CM | POA: Diagnosis not present

## 2013-05-26 DIAGNOSIS — Z96619 Presence of unspecified artificial shoulder joint: Secondary | ICD-10-CM | POA: Diagnosis not present

## 2013-05-31 ENCOUNTER — Other Ambulatory Visit: Payer: Self-pay | Admitting: Cardiology

## 2013-05-31 DIAGNOSIS — IMO0001 Reserved for inherently not codable concepts without codable children: Secondary | ICD-10-CM | POA: Diagnosis not present

## 2013-05-31 DIAGNOSIS — M549 Dorsalgia, unspecified: Secondary | ICD-10-CM | POA: Diagnosis not present

## 2013-05-31 DIAGNOSIS — Z96619 Presence of unspecified artificial shoulder joint: Secondary | ICD-10-CM | POA: Diagnosis not present

## 2013-05-31 DIAGNOSIS — M25519 Pain in unspecified shoulder: Secondary | ICD-10-CM | POA: Diagnosis not present

## 2013-06-02 ENCOUNTER — Ambulatory Visit: Payer: Medicare Other | Admitting: Cardiology

## 2013-06-02 DIAGNOSIS — M549 Dorsalgia, unspecified: Secondary | ICD-10-CM | POA: Diagnosis not present

## 2013-06-02 DIAGNOSIS — Z96619 Presence of unspecified artificial shoulder joint: Secondary | ICD-10-CM | POA: Diagnosis not present

## 2013-06-02 DIAGNOSIS — M25519 Pain in unspecified shoulder: Secondary | ICD-10-CM | POA: Diagnosis not present

## 2013-06-02 DIAGNOSIS — IMO0001 Reserved for inherently not codable concepts without codable children: Secondary | ICD-10-CM | POA: Diagnosis not present

## 2013-06-06 DIAGNOSIS — M25519 Pain in unspecified shoulder: Secondary | ICD-10-CM | POA: Diagnosis not present

## 2013-06-06 DIAGNOSIS — Z96619 Presence of unspecified artificial shoulder joint: Secondary | ICD-10-CM | POA: Diagnosis not present

## 2013-06-06 DIAGNOSIS — M549 Dorsalgia, unspecified: Secondary | ICD-10-CM | POA: Diagnosis not present

## 2013-06-06 DIAGNOSIS — IMO0001 Reserved for inherently not codable concepts without codable children: Secondary | ICD-10-CM | POA: Diagnosis not present

## 2013-06-13 DIAGNOSIS — IMO0001 Reserved for inherently not codable concepts without codable children: Secondary | ICD-10-CM | POA: Diagnosis not present

## 2013-06-13 DIAGNOSIS — E7800 Pure hypercholesterolemia, unspecified: Secondary | ICD-10-CM | POA: Diagnosis not present

## 2013-06-13 DIAGNOSIS — Z96619 Presence of unspecified artificial shoulder joint: Secondary | ICD-10-CM | POA: Diagnosis not present

## 2013-06-13 DIAGNOSIS — M549 Dorsalgia, unspecified: Secondary | ICD-10-CM | POA: Diagnosis not present

## 2013-06-13 DIAGNOSIS — M25519 Pain in unspecified shoulder: Secondary | ICD-10-CM | POA: Diagnosis not present

## 2013-06-14 DIAGNOSIS — IMO0001 Reserved for inherently not codable concepts without codable children: Secondary | ICD-10-CM | POA: Diagnosis not present

## 2013-06-14 DIAGNOSIS — M25519 Pain in unspecified shoulder: Secondary | ICD-10-CM | POA: Diagnosis not present

## 2013-06-14 DIAGNOSIS — Z96619 Presence of unspecified artificial shoulder joint: Secondary | ICD-10-CM | POA: Diagnosis not present

## 2013-06-14 DIAGNOSIS — M549 Dorsalgia, unspecified: Secondary | ICD-10-CM | POA: Diagnosis not present

## 2013-06-19 DIAGNOSIS — E7800 Pure hypercholesterolemia, unspecified: Secondary | ICD-10-CM | POA: Diagnosis not present

## 2013-06-19 DIAGNOSIS — R972 Elevated prostate specific antigen [PSA]: Secondary | ICD-10-CM | POA: Diagnosis not present

## 2013-06-19 DIAGNOSIS — I10 Essential (primary) hypertension: Secondary | ICD-10-CM | POA: Diagnosis not present

## 2013-06-21 DIAGNOSIS — Z96619 Presence of unspecified artificial shoulder joint: Secondary | ICD-10-CM | POA: Diagnosis not present

## 2013-06-21 DIAGNOSIS — M25519 Pain in unspecified shoulder: Secondary | ICD-10-CM | POA: Diagnosis not present

## 2013-06-21 DIAGNOSIS — IMO0001 Reserved for inherently not codable concepts without codable children: Secondary | ICD-10-CM | POA: Diagnosis not present

## 2013-06-23 DIAGNOSIS — Z96619 Presence of unspecified artificial shoulder joint: Secondary | ICD-10-CM | POA: Diagnosis not present

## 2013-06-23 DIAGNOSIS — M25519 Pain in unspecified shoulder: Secondary | ICD-10-CM | POA: Diagnosis not present

## 2013-06-23 DIAGNOSIS — IMO0001 Reserved for inherently not codable concepts without codable children: Secondary | ICD-10-CM | POA: Diagnosis not present

## 2013-06-26 ENCOUNTER — Telehealth: Payer: Self-pay

## 2013-06-26 DIAGNOSIS — Z96619 Presence of unspecified artificial shoulder joint: Secondary | ICD-10-CM | POA: Diagnosis not present

## 2013-06-26 DIAGNOSIS — M25519 Pain in unspecified shoulder: Secondary | ICD-10-CM | POA: Diagnosis not present

## 2013-06-26 DIAGNOSIS — IMO0001 Reserved for inherently not codable concepts without codable children: Secondary | ICD-10-CM | POA: Diagnosis not present

## 2013-06-26 DIAGNOSIS — M19019 Primary osteoarthritis, unspecified shoulder: Secondary | ICD-10-CM | POA: Diagnosis not present

## 2013-06-26 NOTE — Telephone Encounter (Signed)
Patient come in office to get samples of pradaxa150 mg took to front desk to patient

## 2013-06-28 DIAGNOSIS — Z96619 Presence of unspecified artificial shoulder joint: Secondary | ICD-10-CM | POA: Diagnosis not present

## 2013-06-28 DIAGNOSIS — M25519 Pain in unspecified shoulder: Secondary | ICD-10-CM | POA: Diagnosis not present

## 2013-06-28 DIAGNOSIS — IMO0001 Reserved for inherently not codable concepts without codable children: Secondary | ICD-10-CM | POA: Diagnosis not present

## 2013-06-30 DIAGNOSIS — R972 Elevated prostate specific antigen [PSA]: Secondary | ICD-10-CM | POA: Diagnosis not present

## 2013-06-30 DIAGNOSIS — Z8551 Personal history of malignant neoplasm of bladder: Secondary | ICD-10-CM | POA: Diagnosis not present

## 2013-07-18 DIAGNOSIS — C61 Malignant neoplasm of prostate: Secondary | ICD-10-CM | POA: Diagnosis not present

## 2013-07-18 DIAGNOSIS — R972 Elevated prostate specific antigen [PSA]: Secondary | ICD-10-CM | POA: Diagnosis not present

## 2013-07-24 ENCOUNTER — Other Ambulatory Visit: Payer: Self-pay | Admitting: Cardiology

## 2013-07-25 DIAGNOSIS — C61 Malignant neoplasm of prostate: Secondary | ICD-10-CM | POA: Diagnosis not present

## 2013-08-03 ENCOUNTER — Encounter: Payer: Self-pay | Admitting: Cardiology

## 2013-08-03 ENCOUNTER — Ambulatory Visit (INDEPENDENT_AMBULATORY_CARE_PROVIDER_SITE_OTHER): Payer: Medicare Other | Admitting: Cardiology

## 2013-08-03 VITALS — BP 120/74 | HR 61 | Ht 71.0 in | Wt 223.0 lb

## 2013-08-03 DIAGNOSIS — I10 Essential (primary) hypertension: Secondary | ICD-10-CM | POA: Diagnosis not present

## 2013-08-03 DIAGNOSIS — I4891 Unspecified atrial fibrillation: Secondary | ICD-10-CM

## 2013-08-03 DIAGNOSIS — E7800 Pure hypercholesterolemia, unspecified: Secondary | ICD-10-CM | POA: Diagnosis not present

## 2013-08-03 LAB — BASIC METABOLIC PANEL
BUN: 15 mg/dL (ref 6–23)
CALCIUM: 9.6 mg/dL (ref 8.4–10.5)
CHLORIDE: 107 meq/L (ref 96–112)
CO2: 25 mEq/L (ref 19–32)
Creatinine, Ser: 1 mg/dL (ref 0.4–1.5)
GFR: 80.31 mL/min (ref >60.00)
Glucose, Bld: 116 mg/dL — ABNORMAL HIGH (ref 70–99)
Potassium: 3.9 mEq/L (ref 3.5–5.1)
SODIUM: 139 meq/L (ref 135–145)

## 2013-08-03 LAB — HEPATIC FUNCTION PANEL
ALK PHOS: 54 U/L (ref 39–117)
ALT: 26 U/L (ref 0–53)
AST: 30 U/L (ref 0–37)
Albumin: 4 g/dL (ref 3.5–5.2)
BILIRUBIN DIRECT: 0.1 mg/dL (ref 0.0–0.3)
TOTAL PROTEIN: 6.8 g/dL (ref 6.0–8.3)
Total Bilirubin: 0.7 mg/dL (ref 0.3–1.2)

## 2013-08-03 LAB — CBC WITH DIFFERENTIAL/PLATELET
Basophils Absolute: 0 10*3/uL (ref 0.0–0.1)
Basophils Relative: 0.2 % (ref 0.0–3.0)
Eosinophils Absolute: 0.1 10*3/uL (ref 0.0–0.7)
Eosinophils Relative: 0.8 % (ref 0.0–5.0)
HCT: 42.6 % (ref 39.0–52.0)
Hemoglobin: 13.6 g/dL (ref 13.0–17.0)
LYMPHS ABS: 1.5 10*3/uL (ref 0.7–4.0)
LYMPHS PCT: 18.9 % (ref 12.0–46.0)
MCHC: 32 g/dL (ref 30.0–36.0)
MCV: 87.8 fl (ref 78.0–100.0)
MONOS PCT: 12 % (ref 3.0–12.0)
Monocytes Absolute: 1 10*3/uL (ref 0.1–1.0)
Neutro Abs: 5.6 10*3/uL (ref 1.4–7.7)
Neutrophils Relative %: 68.1 % (ref 43.0–77.0)
PLATELETS: 328 10*3/uL (ref 150.0–400.0)
RBC: 4.85 Mil/uL (ref 4.22–5.81)
RDW: 15.6 % — AB (ref 11.5–14.6)
WBC: 8.2 10*3/uL (ref 4.5–10.5)

## 2013-08-03 LAB — TSH: TSH: 3.36 u[IU]/mL (ref 0.35–5.50)

## 2013-08-03 NOTE — Assessment & Plan Note (Signed)
Continue statin. 

## 2013-08-03 NOTE — Patient Instructions (Signed)
Your physician wants you to follow-up in: [place] will receive a reminder letter in the mail two months in advance. If you don't receive a letter, please call our office to schedule the follow-up appointment.   Your physician recommends that you HAVE LAB WORK TODAY  A chest x-ray takes a picture of the organs and structures inside the chest, including the heart, lungs, and blood vessels. This test can show several things, including, whether the heart is enlarges; whether fluid is building up in the lungs; and whether pacemaker / defibrillator leads are still in place. AT [facility]

## 2013-08-03 NOTE — Progress Notes (Signed)
HPI: Followup coronary artery disease status post coronary artery bypassing graft and atrial fibrillation. Patient had previous coronary artery bypassing graft in 1993. Last cardiac catheterization in 2005 showed occluded LAD, circumflex and right coronary artery. LIMA to the LAD was patent, saphenous vein graft to obtuse marginal patent. The saphenous vein graft to the PDA had a severe stenosis but there were collaterals from the obtuse marginal and PLA. Medical therapy recommended. Echocardiogram in March of 2012 showed normal LV function, mild to moderate left atrial enlargement, mild right atrial enlargement, trace aortic and mitral regurgitation. Nuclear study in January of 2014 showed no ischemia or infarction in his ejection fraction was 53%. Since he was last seen in August of 2014, the patient denies any dyspnea on exertion, orthopnea, PND, pedal edema, palpitations, syncope or chest pain.    Current Outpatient Prescriptions  Medication Sig Dispense Refill  . amiodarone (PACERONE) 200 MG tablet TAKE ONE TABLET BY MOUTH EVERY DAY  30 tablet  0  . amLODipine (NORVASC) 5 MG tablet Take 5 mg by mouth daily.      . calcium carbonate (TUMS - DOSED IN MG ELEMENTAL CALCIUM) 500 MG chewable tablet Chew 2 tablets by mouth daily.      . Cholecalciferol (VITAMIN D PO) Take 1 tablet by mouth daily.      . clobetasol cream (TEMOVATE) 0.05 %       . dabigatran ([name]) 150 MG CAPS capsule Take 150 mg by mouth every 12 (twelve) hours.      Marland Kitchen ketoconazole (NIZORAL) 2 % cream       . Methylcellulose, Laxative, 500 MG TABS Take 2 tablets by mouth 2 (two) times daily.       . nitroGLYCERIN (NITROSTAT) 0.4 MG SL tablet Place 1 tablet (0.4 mg total) under the tongue every 5 (five) minutes x 3 doses as needed for chest pain.  25 tablet  3  . rosuvastatin (CRESTOR) 20 MG tablet Take 20 mg by mouth daily.        No current facility-administered medications for this visit.     Past Medical History    Diagnosis Date  . Elevated PSA   . HLD (hyperlipidemia)   . Diverticulosis   . Gout     "been a long time ago" (03/02/2013)  . Adenomatous polyp   . HTN (hypertension)   . CAD (coronary artery disease)     a. remote CABG b. prior cardiac cath 2005 c. Lexiscan Myoview 07/08/12 w/o evidence of ischemia, EF 53%  . Bladder cancer 07/31/2011  . GERD (gastroesophageal reflux disease)   . Myocardial infarction 1990's    silent  . Atrial fibrillation   . Kidney stones     "passed all except one; they had to go get that one" (03/02/2013)  . Arthritis     "left shoulder" (03/02/2013)  . Frequent urination     "day and night" (03/02/2013)    Past Surgical History  Procedure Laterality Date  . Orif acetabular fracture Left 04/2007    trimalleolar fracture  . Cardiovascular stress test  02/10/08    6 mets, poor tolerance, no chest pain   . Cardiac catheterization      total occlusion to LAD, LCX, RCA. LIMA patent, normal seq SVG to OM, severe stenosis in SVG to PDA, but collaterals to PLA from OM. RCA went to PDA. treated medically   . Transurethral resection of bladder tumor  07/31/2011    Procedure: TRANSURETHRAL RESECTION OF  BLADDER TUMOR (TURBT);  Surgeon: Claybon Jabs, MD;  Location: [facility];  Service: Urology;  Laterality: N/A;  with Gyrus  . Total shoulder arthroplasty Left 03/02/2013  . Coronary artery bypass graft  08/1991    "CABG X4" (03/02/2013)  . Cataract extraction w/ intraocular lens implant Right 2014  . Cystoscopy w/ stone manipulation    . Total shoulder arthroplasty Left 03/02/2013    Procedure: LEFT TOTAL SHOULDER ARTHROPLASTY;  Surgeon: Marin Shutter, MD;  Location: [place];  Service: Orthopedics;  Laterality: Left;    History   Social History  . Marital Status: Married    Spouse Name: N/A    Number of Children: 3  . Years of Education: N/A   Occupational History  . retired Psychologist, sport and exercise    Social History Main Topics  . Smoking status: Never Smoker   . Smokeless  tobacco: Never Used  . Alcohol Use: No  . Drug Use: No  . Sexual Activity: No   Other Topics Concern  . Not on file   Social History Narrative   Lives at home with wife.      ROS: some residual pain in left shoulder from previous surgery but no fevers or chills, productive cough, hemoptysis, dysphasia, odynophagia, melena, hematochezia, dysuria, hematuria, rash, seizure activity, orthopnea, PND, pedal edema, claudication. Remaining systems are negative.  Physical Exam: Well-developed well-nourished in no acute distress.  Skin is warm and dry.  HEENT is normal.  Neck is supple.  Chest is clear to auscultation with normal expansion.  Cardiovascular exam is regular rate and rhythm.  Abdominal exam nontender or distended. No masses palpated. Extremities show no edema. neuro grossly intact  ECG sinus rhythm at a rate of 61. Right bundle branch block. Cannot rule out prior inferior infarct.

## 2013-08-03 NOTE — Assessment & Plan Note (Signed)
Patient remains in sinus rhythm. Continue amiodarone. Check TSH, liver functions and chest x-ray. Continue pradaxa. Check hemoglobin and renal function. 

## 2013-08-03 NOTE — Assessment & Plan Note (Signed)
Blood pressure controlled. Continue present medications. 

## 2013-08-03 NOTE — Assessment & Plan Note (Addendum)
Continue statin. Not on aspirin given need for anticoagulation. 

## 2013-08-28 ENCOUNTER — Other Ambulatory Visit: Payer: Self-pay | Admitting: Cardiology

## 2013-09-20 ENCOUNTER — Other Ambulatory Visit: Payer: Self-pay | Admitting: *Deleted

## 2013-09-20 MED ORDER — DABIGATRAN ETEXILATE MESYLATE 150 MG PO CAPS: 150.0000 mg | ORAL_CAPSULE | Freq: Two times a day (BID) | ORAL | Status: DC

## 2013-10-02 ENCOUNTER — Other Ambulatory Visit: Payer: Self-pay | Admitting: Cardiology

## 2013-10-24 ENCOUNTER — Telehealth: Payer: Self-pay

## 2013-10-24 NOTE — Telephone Encounter (Signed)
Patient 's wife called to get samples of pradaxa placed samples up front

## 2013-11-23 ENCOUNTER — Other Ambulatory Visit: Payer: Self-pay | Admitting: Cardiology

## 2013-11-24 DIAGNOSIS — Z96619 Presence of unspecified artificial shoulder joint: Secondary | ICD-10-CM | POA: Diagnosis not present

## 2013-11-28 ENCOUNTER — Other Ambulatory Visit: Payer: Self-pay | Admitting: Cardiology

## 2013-11-30 ENCOUNTER — Telehealth: Payer: Self-pay

## 2013-11-30 NOTE — Telephone Encounter (Signed)
Patient's wife came to office to get samples of pradaxa gave to her at front desk

## 2013-12-13 DIAGNOSIS — I10 Essential (primary) hypertension: Secondary | ICD-10-CM | POA: Diagnosis not present

## 2013-12-13 DIAGNOSIS — Z0000 Encounter for general adult medical examination without abnormal findings: Secondary | ICD-10-CM | POA: Diagnosis not present

## 2013-12-13 DIAGNOSIS — E7800 Pure hypercholesterolemia, unspecified: Secondary | ICD-10-CM | POA: Diagnosis not present

## 2013-12-13 DIAGNOSIS — N401 Enlarged prostate with lower urinary tract symptoms: Secondary | ICD-10-CM | POA: Diagnosis not present

## 2013-12-19 DIAGNOSIS — M19019 Primary osteoarthritis, unspecified shoulder: Secondary | ICD-10-CM | POA: Diagnosis not present

## 2013-12-19 DIAGNOSIS — K469 Unspecified abdominal hernia without obstruction or gangrene: Secondary | ICD-10-CM | POA: Diagnosis not present

## 2013-12-19 DIAGNOSIS — I10 Essential (primary) hypertension: Secondary | ICD-10-CM | POA: Diagnosis not present

## 2013-12-19 DIAGNOSIS — I2510 Atherosclerotic heart disease of native coronary artery without angina pectoris: Secondary | ICD-10-CM | POA: Diagnosis not present

## 2014-01-15 DIAGNOSIS — C61 Malignant neoplasm of prostate: Secondary | ICD-10-CM | POA: Diagnosis not present

## 2014-01-29 DIAGNOSIS — Z8551 Personal history of malignant neoplasm of bladder: Secondary | ICD-10-CM | POA: Diagnosis not present

## 2014-01-29 DIAGNOSIS — C61 Malignant neoplasm of prostate: Secondary | ICD-10-CM | POA: Diagnosis not present

## 2014-02-01 ENCOUNTER — Ambulatory Visit (INDEPENDENT_AMBULATORY_CARE_PROVIDER_SITE_OTHER)
Admission: RE | Admit: 2014-02-01 | Discharge: 2014-02-01 | Disposition: A | Discharge status: Home / Self Care | Payer: Medicare Other | Source: Ambulatory Visit | Attending: Cardiology | Admitting: Cardiology

## 2014-02-01 ENCOUNTER — Encounter: Payer: Self-pay | Admitting: Cardiology

## 2014-02-01 ENCOUNTER — Ambulatory Visit (INDEPENDENT_AMBULATORY_CARE_PROVIDER_SITE_OTHER): Payer: Medicare Other | Admitting: Cardiology

## 2014-02-01 VITALS — BP 126/64 | HR 59 | Ht 71.0 in | Wt 229.0 lb

## 2014-02-01 DIAGNOSIS — I4891 Unspecified atrial fibrillation: Secondary | ICD-10-CM

## 2014-02-01 DIAGNOSIS — I2510 Atherosclerotic heart disease of native coronary artery without angina pectoris: Secondary | ICD-10-CM | POA: Diagnosis not present

## 2014-02-01 DIAGNOSIS — E7800 Pure hypercholesterolemia, unspecified: Secondary | ICD-10-CM

## 2014-02-01 DIAGNOSIS — I10 Essential (primary) hypertension: Secondary | ICD-10-CM

## 2014-02-01 LAB — CBC
HCT: 42.2 % (ref 39.0–52.0)
Hemoglobin: 13.8 g/dL (ref 13.0–17.0)
MCH: 28.3 pg (ref 26.0–34.0)
MCHC: 32.7 g/dL (ref 30.0–36.0)
MCV: 86.5 fL (ref 78.0–100.0)
PLATELETS: 331 10*3/uL (ref 150–400)
RBC: 4.88 MIL/uL (ref 4.22–5.81)
RDW: 14.3 % (ref 11.5–15.5)
WBC: 8 10*3/uL (ref 4.0–10.5)

## 2014-02-01 NOTE — Assessment & Plan Note (Signed)
Blood pressure controlled. Continue present medications. Check potassium and renal function. 

## 2014-02-01 NOTE — Assessment & Plan Note (Signed)
Continue amiodarone. Check liver functions, TSH and chest x-ray. Continue pradaxa. Check hemoglobin.

## 2014-02-01 NOTE — Patient Instructions (Signed)
Your physician wants you to follow-up in: [place] will receive a reminder letter in the mail two months in advance. If you don't receive a letter, please call our office to schedule the follow-up appointment.   Your physician recommends that you HAVE LAB WORK TODAY  A chest x-ray takes a picture of the organs and structures inside the chest, including the heart, lungs, and blood vessels. This test can show several things, including, whether the heart is enlarges; whether fluid is building up in the lungs; and whether pacemaker / defibrillator leads are still in place. AT [place]

## 2014-02-01 NOTE — Assessment & Plan Note (Signed)
Continue statin. Not on aspirin given need for Anticoagulation. 

## 2014-02-01 NOTE — Progress Notes (Signed)
HPI: Followup coronary artery disease status post coronary artery bypassing graft and atrial fibrillation. Patient had previous coronary artery bypassing graft in 1993. Last cardiac catheterization in 2005 showed occluded LAD, circumflex and right coronary artery. LIMA to the LAD was patent, saphenous vein graft to obtuse marginal patent. The saphenous vein graft to the PDA had a severe stenosis but there were collaterals from the obtuse marginal and PLA. Medical therapy recommended. Echocardiogram in March of 2012 showed normal LV function, mild to moderate left atrial enlargement, mild right atrial enlargement, trace aortic and mitral regurgitation. Nuclear study in January of 2014 showed no ischemia or infarction and his ejection fraction was 53%. Since he was last seen in Feb 2015, the patient denies any dyspnea on exertion, orthopnea, PND, pedal edema, palpitations, syncope or chest pain.   Current Outpatient Prescriptions  Medication Sig Dispense Refill  . amiodarone (PACERONE) 200 MG tablet TAKE ONE TABLET BY MOUTH EVERY DAY  30 tablet  6  . amLODipine (NORVASC) 5 MG tablet Take 5 mg by mouth daily.      Marland Kitchen amLODipine (NORVASC) 5 MG tablet TAKE ONE TABLET BY MOUTH EVERY DAY  90 tablet  0  . calcium carbonate (TUMS - DOSED IN MG ELEMENTAL CALCIUM) 500 MG chewable tablet Chew 2 tablets by mouth daily.      . Cholecalciferol (VITAMIN D PO) Take 1 tablet by mouth daily.      . clobetasol cream (TEMOVATE) 0.05 %       . ketoconazole (NIZORAL) 2 % cream       . Methylcellulose, Laxative, 500 MG TABS Take 2 tablets by mouth 2 (two) times daily.       . nitroGLYCERIN (NITROSTAT) 0.4 MG SL tablet Place 1 tablet (0.4 mg total) under the tongue every 5 (five) minutes x 3 doses as needed for chest pain.  25 tablet  3  . [name] 150 MG CAPS capsule TAKE ONE CAPSULE EVERY 12 HOURS  60 capsule  3  . rosuvastatin (CRESTOR) 20 MG tablet Take 20 mg by mouth daily.        No current  facility-administered medications for this visit.     Past Medical History  Diagnosis Date  . Elevated PSA   . HLD (hyperlipidemia)   . Diverticulosis   . Gout     "been a long time ago" (03/02/2013)  . Adenomatous polyp   . HTN (hypertension)   . CAD (coronary artery disease)     a. remote CABG b. prior cardiac cath 2005 c. Lexiscan Myoview 07/08/12 w/o evidence of ischemia, EF 53%  . Bladder cancer 07/31/2011  . GERD (gastroesophageal reflux disease)   . Myocardial infarction 1990's    silent  . Atrial fibrillation   . Kidney stones     "passed all except one; they had to go get that one" (03/02/2013)  . Arthritis     "left shoulder" (03/02/2013)  . Frequent urination     "day and night" (03/02/2013)    Past Surgical History  Procedure Laterality Date  . Orif acetabular fracture Left 04/2007    trimalleolar fracture  . Cardiovascular stress test  02/10/08    6 mets, poor tolerance, no chest pain   . Cardiac catheterization      total occlusion to LAD, LCX, RCA. LIMA patent, normal seq SVG to OM, severe stenosis in SVG to PDA, but collaterals to PLA from OM. RCA went to PDA. treated medically   . Transurethral  resection of bladder tumor  07/31/2011    Procedure: TRANSURETHRAL RESECTION OF BLADDER TUMOR (TURBT);  Surgeon: Claybon Jabs, MD;  Location: [facility];  Service: Urology;  Laterality: N/A;  with [facility]  . Total shoulder arthroplasty Left 03/02/2013  . Coronary artery bypass graft  08/1991    "CABG X4" (03/02/2013)  . Cataract extraction w/ intraocular lens implant Right 2014  . Cystoscopy w/ stone manipulation    . Total shoulder arthroplasty Left 03/02/2013    Procedure: LEFT TOTAL SHOULDER ARTHROPLASTY;  Surgeon: Marin Shutter, MD;  Location: [place];  Service: Orthopedics;  Laterality: Left;    History   Social History  . Marital Status: Married    Spouse Name: N/A    Number of Children: 3  . Years of Education: N/A   Occupational History  . retired Psychologist, sport and exercise     Social History Main Topics  . Smoking status: Never Smoker   . Smokeless tobacco: Never Used  . Alcohol Use: No  . Drug Use: No  . Sexual Activity: No   Other Topics Concern  . Not on file   Social History Narrative   Lives at home with wife.      ROS: no fevers or chills, productive cough, hemoptysis, dysphasia, odynophagia, melena, hematochezia, dysuria, hematuria, rash, seizure activity, orthopnea, PND, pedal edema, claudication. Remaining systems are negative.  Physical Exam: Well-developed well-nourished in no acute distress.  Skin is warm and dry.  HEENT is normal.  Neck is supple.  Chest is clear to auscultation with normal expansion.  Cardiovascular exam is regular rate and rhythm. 2/6 systolic murmur left sternal border. Abdominal exam nontender or distended. No masses palpated. Extremities show no edema. neuro grossly intact  ECG Sinus rhythm at a rate of 59. Right bundle branch block. Inferior infarct. Left ventricular hypertrophy.

## 2014-02-01 NOTE — Assessment & Plan Note (Signed)
Continue statin. Check lipids and liver. 

## 2014-02-02 LAB — BASIC METABOLIC PANEL WITH GFR
BUN: 15 mg/dL (ref 6–23)
CHLORIDE: 104 meq/L (ref 96–112)
CO2: 24 meq/L (ref 19–32)
Calcium: 9 mg/dL (ref 8.4–10.5)
Creat: 0.95 mg/dL (ref 0.50–1.35)
GFR, EST NON AFRICAN AMERICAN: 76 mL/min
GFR, Est African American: 88 mL/min
Glucose, Bld: 151 mg/dL — ABNORMAL HIGH (ref 70–99)
POTASSIUM: 3.3 meq/L — AB (ref 3.5–5.3)
SODIUM: 141 meq/L (ref 135–145)

## 2014-02-02 LAB — HEPATIC FUNCTION PANEL
ALK PHOS: 58 U/L (ref 39–117)
ALT: 26 U/L (ref 0–53)
AST: 29 U/L (ref 0–37)
Albumin: 4.5 g/dL (ref 3.5–5.2)
BILIRUBIN INDIRECT: 0.5 mg/dL (ref 0.2–1.2)
BILIRUBIN TOTAL: 0.6 mg/dL (ref 0.2–1.2)
Bilirubin, Direct: 0.1 mg/dL (ref 0.0–0.3)
Total Protein: 7.4 g/dL (ref 6.0–8.3)

## 2014-02-02 LAB — LIPID PANEL
CHOL/HDL RATIO: 2.6 ratio
Cholesterol: 131 mg/dL (ref 0–200)
HDL: 50 mg/dL (ref >39)
LDL CALC: 58 mg/dL (ref 0–99)
Triglycerides: 114 mg/dL (ref <150)
VLDL: 23 mg/dL (ref 0–40)

## 2014-02-02 LAB — TSH: TSH: 3.874 u[IU]/mL (ref 0.350–4.500)

## 2014-04-06 ENCOUNTER — Other Ambulatory Visit: Payer: Self-pay | Admitting: Cardiology

## 2014-04-09 ENCOUNTER — Telehealth: Payer: Self-pay | Admitting: Cardiology

## 2014-04-09 MED ORDER — DABIGATRAN ETEXILATE MESYLATE 150 MG PO CAPS: 150.0000 mg | ORAL_CAPSULE | Freq: Two times a day (BID) | ORAL | Status: DC

## 2014-04-09 NOTE — Telephone Encounter (Signed)
Samples left at front desk for patient pick-up.  Patient notified. 

## 2014-04-09 NOTE — Telephone Encounter (Signed)
Pt's wife called in wanting some samples of [name] 150mg . Please call  Thanks

## 2014-04-19 DIAGNOSIS — Z23 Encounter for immunization: Secondary | ICD-10-CM | POA: Diagnosis not present

## 2014-04-30 ENCOUNTER — Telehealth: Payer: Self-pay | Admitting: Cardiology

## 2014-04-30 NOTE — Telephone Encounter (Signed)
Spoke with Evan Dorsey, aware samples are at the front desk for pick up

## 2014-04-30 NOTE — Telephone Encounter (Signed)
Evan Dorsey will be in town on Wednesday and would like to pick up [name] 150 samples.  Thanks! Deedie

## 2014-05-28 ENCOUNTER — Other Ambulatory Visit: Payer: Self-pay | Admitting: Cardiology

## 2014-05-28 DIAGNOSIS — H2512 Age-related nuclear cataract, left eye: Secondary | ICD-10-CM | POA: Diagnosis not present

## 2014-06-01 ENCOUNTER — Ambulatory Visit: Payer: Self-pay | Admitting: Ophthalmology

## 2014-06-01 DIAGNOSIS — Z01810 Encounter for preprocedural cardiovascular examination: Secondary | ICD-10-CM | POA: Diagnosis not present

## 2014-06-01 DIAGNOSIS — H2512 Age-related nuclear cataract, left eye: Secondary | ICD-10-CM | POA: Diagnosis not present

## 2014-06-12 ENCOUNTER — Ambulatory Visit: Payer: Self-pay | Admitting: Ophthalmology

## 2014-06-12 DIAGNOSIS — Z8551 Personal history of malignant neoplasm of bladder: Secondary | ICD-10-CM | POA: Diagnosis not present

## 2014-06-12 DIAGNOSIS — N400 Enlarged prostate without lower urinary tract symptoms: Secondary | ICD-10-CM | POA: Diagnosis not present

## 2014-06-12 DIAGNOSIS — I4510 Unspecified right bundle-branch block: Secondary | ICD-10-CM | POA: Diagnosis not present

## 2014-06-12 DIAGNOSIS — I252 Old myocardial infarction: Secondary | ICD-10-CM | POA: Diagnosis not present

## 2014-06-12 DIAGNOSIS — I4891 Unspecified atrial fibrillation: Secondary | ICD-10-CM | POA: Diagnosis not present

## 2014-06-12 DIAGNOSIS — Z951 Presence of aortocoronary bypass graft: Secondary | ICD-10-CM | POA: Diagnosis not present

## 2014-06-12 DIAGNOSIS — I2510 Atherosclerotic heart disease of native coronary artery without angina pectoris: Secondary | ICD-10-CM | POA: Diagnosis not present

## 2014-06-12 DIAGNOSIS — H269 Unspecified cataract: Secondary | ICD-10-CM | POA: Diagnosis not present

## 2014-06-12 DIAGNOSIS — H2512 Age-related nuclear cataract, left eye: Secondary | ICD-10-CM | POA: Diagnosis not present

## 2014-06-22 DIAGNOSIS — H6123 Impacted cerumen, bilateral: Secondary | ICD-10-CM | POA: Diagnosis not present

## 2014-07-24 ENCOUNTER — Telehealth: Payer: Self-pay | Admitting: *Deleted

## 2014-07-24 NOTE — Telephone Encounter (Signed)
Patient's wife, Jarrett Soho, had called in earlier today requesting pradxa samples for her husband   Medication samples have been provided to the patient.  Drug name: pradaxa 150  Qty: 48  LOT: [id number]  Exp.Date: 02/2016  Samples left at front desk for patient pick-up. Patient notified.  Sheral Apley M 5:24 PM 07/24/2014

## 2014-08-07 DIAGNOSIS — Z8551 Personal history of malignant neoplasm of bladder: Secondary | ICD-10-CM | POA: Diagnosis not present

## 2014-08-07 DIAGNOSIS — C61 Malignant neoplasm of prostate: Secondary | ICD-10-CM | POA: Diagnosis not present

## 2014-08-09 NOTE — Progress Notes (Signed)
HPI: Followup coronary artery disease status post coronary artery bypassing graft and atrial fibrillation. Patient had previous coronary artery bypassing graft in 1993. Last cardiac catheterization in 2005 showed occluded LAD, circumflex and right coronary artery. LIMA to the LAD was patent, saphenous vein graft to obtuse marginal patent. The saphenous vein graft to the PDA had a severe stenosis but there were collaterals from the obtuse marginal and PLA. Medical therapy recommended. Echocardiogram in March of 2012 showed normal LV function, mild to moderate left atrial enlargement, mild right atrial enlargement, trace aortic and mitral regurgitation. Nuclear study in January of 2014 showed no ischemia or infarction and his ejection fraction was 53%. Since he was last seen  Current Outpatient Prescriptions  Medication Sig Dispense Refill  . amiodarone (PACERONE) 200 MG tablet TAKE ONE TABLET BY MOUTH EVERY DAY 30 tablet 6  . amLODipine (NORVASC) 5 MG tablet Take 5 mg by mouth daily.    Marland Kitchen amLODipine (NORVASC) 5 MG tablet TAKE ONE TABLET BY MOUTH EVERY DAY 90 tablet 0  . calcium carbonate (TUMS - DOSED IN MG ELEMENTAL CALCIUM) 500 MG chewable tablet Chew 2 tablets by mouth daily.    . Cholecalciferol (VITAMIN D PO) Take 1 tablet by mouth daily.    . clobetasol cream (TEMOVATE) 0.05 %     . dabigatran ([name]) 150 MG CAPS capsule Take 1 capsule (150 mg total) by mouth 2 (two) times daily. 60 capsule 0  . ketoconazole (NIZORAL) 2 % cream     . Methylcellulose, Laxative, 500 MG TABS Take 2 tablets by mouth 2 (two) times daily.     . nitroGLYCERIN (NITROSTAT) 0.4 MG SL tablet Place 1 tablet (0.4 mg total) under the tongue every 5 (five) minutes x 3 doses as needed for chest pain. 25 tablet 3  . rosuvastatin (CRESTOR) 20 MG tablet Take 20 mg by mouth daily.      No current facility-administered medications for this visit.     Past Medical History  Diagnosis Date  . Elevated PSA   . HLD  (hyperlipidemia)   . Diverticulosis   . Gout     "been a long time ago" (03/02/2013)  . Adenomatous polyp   . HTN (hypertension)   . CAD (coronary artery disease)     a. remote CABG b. prior cardiac cath 2005 c. Lexiscan Myoview 07/08/12 w/o evidence of ischemia, EF 53%  . Bladder cancer 07/31/2011  . GERD (gastroesophageal reflux disease)   . Myocardial infarction 1990's    silent  . Atrial fibrillation   . Kidney stones     "passed all except one; they had to go get that one" (03/02/2013)  . Arthritis     "left shoulder" (03/02/2013)  . Frequent urination     "day and night" (03/02/2013)    Past Surgical History  Procedure Laterality Date  . Orif acetabular fracture Left 04/2007    trimalleolar fracture  . Cardiovascular stress test  02/10/08    6 mets, poor tolerance, no chest pain   . Cardiac catheterization      total occlusion to LAD, LCX, RCA. LIMA patent, normal seq SVG to OM, severe stenosis in SVG to PDA, but collaterals to PLA from OM. RCA went to PDA. treated medically   . Transurethral resection of bladder tumor  07/31/2011    Procedure: TRANSURETHRAL RESECTION OF BLADDER TUMOR (TURBT);  Surgeon: Claybon Jabs, MD;  Location: [facility];  Service: Urology;  Laterality: N/A;  with [facility]  .  Total shoulder arthroplasty Left 03/02/2013  . Coronary artery bypass graft  08/1991    "CABG X4" (03/02/2013)  . Cataract extraction w/ intraocular lens implant Right 2014  . Cystoscopy w/ stone manipulation    . Total shoulder arthroplasty Left 03/02/2013    Procedure: LEFT TOTAL SHOULDER ARTHROPLASTY;  Surgeon: Marin Shutter, MD;  Location: [place];  Service: Orthopedics;  Laterality: Left;    History   Social History  . Marital Status: Married    Spouse Name: N/A  . Number of Children: 3  . Years of Education: N/A   Occupational History  . retired Psychologist, sport and exercise    Social History Main Topics  . Smoking status: Never Smoker   . Smokeless tobacco: Never Used  . Alcohol Use: No  . Drug  Use: No  . Sexual Activity: No   Other Topics Concern  . Not on file   Social History Narrative   Lives at home with wife.      ROS: no fevers or chills, productive cough, hemoptysis, dysphasia, odynophagia, melena, hematochezia, dysuria, hematuria, rash, seizure activity, orthopnea, PND, pedal edema, claudication. Remaining systems are negative.  Physical Exam: Well-developed well-nourished in no acute distress.  Skin is warm and dry.  HEENT is normal.  Neck is supple.  Chest is clear to auscultation with normal expansion.  Cardiovascular exam is regular rate and rhythm.  Abdominal exam nontender or distended. No masses palpated. Extremities show no edema. neuro grossly intact  ECG     This encounter was created in error - please disregard.

## 2014-08-10 ENCOUNTER — Encounter: Payer: Medicare Other | Admitting: Cardiology

## 2014-08-13 ENCOUNTER — Telehealth: Payer: Self-pay | Admitting: Cardiology

## 2014-08-13 NOTE — Telephone Encounter (Signed)
Received records from [facility] for appointment with Dr Stanford Breed on 09/11/14.  Records given to [facility] (medical records) for Dr Jacalyn Lefevre schedule on 09/11/14.

## 2014-08-27 ENCOUNTER — Other Ambulatory Visit: Payer: Self-pay | Admitting: Cardiology

## 2014-08-27 NOTE — Telephone Encounter (Signed)
Rx refill sent to patient pharmacy   

## 2014-09-09 NOTE — Progress Notes (Signed)
HPI: Followup coronary artery disease status post coronary artery bypassing graft and atrial fibrillation. Patient had previous coronary artery bypassing graft in 1993. Last cardiac catheterization in 2005 showed occluded LAD, circumflex and right coronary artery. LIMA to the LAD was patent, saphenous vein graft to obtuse marginal patent. The saphenous vein graft to the PDA had a severe stenosis but there were collaterals from the obtuse marginal and PLA. Medical therapy recommended. Echocardiogram in March of 2012 showed normal LV function, mild to moderate left atrial enlargement, mild right atrial enlargement, trace aortic and mitral regurgitation. Nuclear study in January of 2014 showed no ischemia or infarction and his ejection fraction was 53%. Since he was last seen, the patient denies any dyspnea on exertion, orthopnea, PND, pedal edema, palpitations, syncope or chest pain.   Current Outpatient Prescriptions  Medication Sig Dispense Refill  . amiodarone (PACERONE) 200 MG tablet TAKE ONE TABLET BY MOUTH EVERY DAY 30 tablet 6  . amLODipine (NORVASC) 5 MG tablet TAKE ONE TABLET BY MOUTH EVERY DAY 90 tablet 1  . calcium carbonate (TUMS - DOSED IN MG ELEMENTAL CALCIUM) 500 MG chewable tablet Chew 2 tablets by mouth daily.    . Cholecalciferol (VITAMIN D PO) Take 1 tablet by mouth daily.    . clobetasol cream (TEMOVATE) 0.05 %     . CRESTOR 40 MG tablet Take 20 mg by mouth daily.  3  . dabigatran ([name]) 150 MG CAPS capsule Take 1 capsule (150 mg total) by mouth 2 (two) times daily. 60 capsule 0  . ketoconazole (NIZORAL) 2 % cream     . Methylcellulose, Laxative, 500 MG TABS Take 2 tablets by mouth 2 (two) times daily.     . nitroGLYCERIN (NITROSTAT) 0.4 MG SL tablet Place 1 tablet (0.4 mg total) under the tongue every 5 (five) minutes x 3 doses as needed for chest pain. 25 tablet 3  . Saw Palmetto, Serenoa repens, (SAW PALMETTO PO) Take 2 capsules by mouth daily.    . tamsulosin  (FLOMAX) 0.4 MG CAPS capsule Take 1 capsule by mouth daily.  6   No current facility-administered medications for this visit.     Past Medical History  Diagnosis Date  . Elevated PSA   . HLD (hyperlipidemia)   . Diverticulosis   . Gout     "been a long time ago" (03/02/2013)  . Adenomatous polyp   . HTN (hypertension)   . CAD (coronary artery disease)     a. remote CABG b. prior cardiac cath 2005 c. Lexiscan Myoview 07/08/12 w/o evidence of ischemia, EF 53%  . Bladder cancer 07/31/2011  . GERD (gastroesophageal reflux disease)   . Myocardial infarction 1990's    silent  . Atrial fibrillation   . Kidney stones     "passed all except one; they had to go get that one" (03/02/2013)  . Arthritis     "left shoulder" (03/02/2013)  . Frequent urination     "day and night" (03/02/2013)    Past Surgical History  Procedure Laterality Date  . Orif acetabular fracture Left 04/2007    trimalleolar fracture  . Cardiovascular stress test  02/10/08    6 mets, poor tolerance, no chest pain   . Cardiac catheterization      total occlusion to LAD, LCX, RCA. LIMA patent, normal seq SVG to OM, severe stenosis in SVG to PDA, but collaterals to PLA from OM. RCA went to PDA. treated medically   . Transurethral resection of  bladder tumor  07/31/2011    Procedure: TRANSURETHRAL RESECTION OF BLADDER TUMOR (TURBT);  Surgeon: Claybon Jabs, MD;  Location: [facility];  Service: Urology;  Laterality: N/A;  with Gyrus  . Total shoulder arthroplasty Left 03/02/2013  . Coronary artery bypass graft  08/1991    "CABG X4" (03/02/2013)  . Cataract extraction w/ intraocular lens implant Right 2014  . Cystoscopy w/ stone manipulation    . Total shoulder arthroplasty Left 03/02/2013    Procedure: LEFT TOTAL SHOULDER ARTHROPLASTY;  Surgeon: Marin Shutter, MD;  Location: [place];  Service: Orthopedics;  Laterality: Left;    History   Social History  . Marital Status: Married    Spouse Name: N/A  . Number of Children: 3    . Years of Education: N/A   Occupational History  . retired Psychologist, sport and exercise    Social History Main Topics  . Smoking status: Never Smoker   . Smokeless tobacco: Never Used  . Alcohol Use: No  . Drug Use: No  . Sexual Activity: No   Other Topics Concern  . Not on file   Social History Narrative   Lives at home with wife.      ROS: no fevers or chills, productive cough, hemoptysis, dysphasia, odynophagia, melena, hematochezia, dysuria, hematuria, rash, seizure activity, orthopnea, PND, pedal edema, claudication. Remaining systems are negative.  Physical Exam: Well-developed well-nourished in no acute distress.  Skin is warm and dry.  HEENT is normal.  Neck is supple.  Chest is clear to auscultation with normal expansion.  Cardiovascular exam is regular rate and rhythm.  Abdominal exam nontender or distended. No masses palpated. Extremities show trace edema. neuro grossly intact  ECG sinus rhythm at a rate of 60. Right bundle branch block.

## 2014-09-11 ENCOUNTER — Ambulatory Visit (INDEPENDENT_AMBULATORY_CARE_PROVIDER_SITE_OTHER): Payer: Medicare Other | Admitting: Cardiology

## 2014-09-11 ENCOUNTER — Ambulatory Visit (INDEPENDENT_AMBULATORY_CARE_PROVIDER_SITE_OTHER)
Admission: RE | Admit: 2014-09-11 | Discharge: 2014-09-11 | Disposition: A | Discharge status: Home / Self Care | Payer: Medicare Other | Source: Ambulatory Visit | Attending: Cardiology | Admitting: Cardiology

## 2014-09-11 ENCOUNTER — Encounter: Payer: Self-pay | Admitting: Cardiology

## 2014-09-11 VITALS — BP 128/68 | HR 60 | Ht 71.0 in | Wt 233.0 lb

## 2014-09-11 DIAGNOSIS — I25810 Atherosclerosis of coronary artery bypass graft(s) without angina pectoris: Secondary | ICD-10-CM | POA: Diagnosis not present

## 2014-09-11 DIAGNOSIS — I4891 Unspecified atrial fibrillation: Secondary | ICD-10-CM

## 2014-09-11 DIAGNOSIS — I10 Essential (primary) hypertension: Secondary | ICD-10-CM | POA: Diagnosis not present

## 2014-09-11 NOTE — Assessment & Plan Note (Signed)
Continue statin. Not on aspirin given need for anticoagulation. 

## 2014-09-11 NOTE — Assessment & Plan Note (Signed)
Continue present blood pressure medications. 

## 2014-09-11 NOTE — Assessment & Plan Note (Signed)
Continue statin. 

## 2014-09-11 NOTE — Assessment & Plan Note (Signed)
Patient remains in sinus rhythm. Continue amiodarone. Check TSH, liver functions and chest x-ray. Continue pradaxa. Check hemoglobin and renal function.

## 2014-09-11 NOTE — Patient Instructions (Signed)
Your physician wants you to follow-up in: 6 MONTHS WITH DR CRENSHAW You will receive a reminder letter in the mail two months in advance. If you don't receive a letter, please call our office to schedule the follow-up appointment.   Your physician recommends that you HAVE LAB WORK TODAY  A chest x-ray takes a picture of the organs and structures inside the chest, including the heart, lungs, and blood vessels. This test can show several things, including, whether the heart is enlarges; whether fluid is building up in the lungs; and whether pacemaker / defibrillator leads are still in place.  

## 2014-09-12 LAB — CBC
HCT: 40.9 % (ref 39.0–52.0)
Hemoglobin: 12.9 g/dL — ABNORMAL LOW (ref 13.0–17.0)
MCH: 27.6 pg (ref 26.0–34.0)
MCHC: 31.5 g/dL (ref 30.0–36.0)
MCV: 87.6 fL (ref 78.0–100.0)
MPV: 10.3 fL (ref 8.6–12.4)
PLATELETS: 309 10*3/uL (ref 150–400)
RBC: 4.67 MIL/uL (ref 4.22–5.81)
RDW: 15.4 % (ref 11.5–15.5)
WBC: 8.8 10*3/uL (ref 4.0–10.5)

## 2014-09-12 LAB — BASIC METABOLIC PANEL WITH GFR
BUN: 12 mg/dL (ref 6–23)
CHLORIDE: 108 meq/L (ref 96–112)
CO2: 22 mEq/L (ref 19–32)
Calcium: 9.1 mg/dL (ref 8.4–10.5)
Creat: 0.93 mg/dL (ref 0.50–1.35)
GFR, EST NON AFRICAN AMERICAN: 77 mL/min
GFR, Est African American: 89 mL/min
GLUCOSE: 144 mg/dL — AB (ref 70–99)
POTASSIUM: 3.7 meq/L (ref 3.5–5.3)
SODIUM: 141 meq/L (ref 135–145)

## 2014-09-12 LAB — HEPATIC FUNCTION PANEL
ALT: 18 U/L (ref 0–53)
AST: 22 U/L (ref 0–37)
Albumin: 3.9 g/dL (ref 3.5–5.2)
Alkaline Phosphatase: 51 U/L (ref 39–117)
BILIRUBIN TOTAL: 0.5 mg/dL (ref 0.2–1.2)
Bilirubin, Direct: 0.1 mg/dL (ref 0.0–0.3)
Indirect Bilirubin: 0.4 mg/dL (ref 0.2–1.2)
TOTAL PROTEIN: 6.7 g/dL (ref 6.0–8.3)

## 2014-09-12 LAB — TSH: TSH: 3.223 u[IU]/mL (ref 0.350–4.500)

## 2014-10-03 DIAGNOSIS — B353 Tinea pedis: Secondary | ICD-10-CM | POA: Diagnosis not present

## 2014-10-03 DIAGNOSIS — L309 Dermatitis, unspecified: Secondary | ICD-10-CM | POA: Diagnosis not present

## 2014-10-05 NOTE — Op Note (Signed)
PATIENT NAME:  Evan Dorsey, SCHONBERG MR#:  [id number] DATE OF BIRTH:  11/16/1932  DATE OF PROCEDURE:  07/12/2012  PREOPERATIVE DIAGNOSIS: Visually significant cataract of the right eye.   POSTOPERATIVE DIAGNOSIS: Visually significant cataract of the right eye.   OPERATIVE PROCEDURE: Cataract extraction by phacoemulsification with implant of intraocular lens to right eye.   SURGEON: Birder Robson, MD.   ANESTHESIA:  1. Managed anesthesia care.  2. Topical tetracaine drops followed by 2% Xylocaine jelly applied in the preoperative holding area.   COMPLICATIONS: None.   TECHNIQUE:  Stop and chop.   DESCRIPTION OF PROCEDURE: The patient was examined and consented in the preoperative holding area where the aforementioned topical anesthesia was applied to the right eye and then brought back to the Operating Room where the right eye was prepped and draped in the usual sterile ophthalmic fashion and a lid speculum was placed. A paracentesis was created with the side port blade and the anterior chamber was filled with viscoelastic. A near clear corneal incision was performed with the steel keratome. A continuous curvilinear capsulorrhexis was performed with a cystotome followed by the capsulorrhexis forceps. Hydrodissection and hydrodelineation were carried out with BSS on a blunt cannula. The lens was removed in a stop and chop technique and the remaining cortical material was removed with the irrigation-aspiration handpiece. The capsular bag was inflated with viscoelastic and the Tecnis ZCB00 20.5-diopter lens, serial number [id number] was placed in the capsular bag without complication. The remaining viscoelastic was removed from the eye with the irrigation-aspiration handpiece. The wounds were hydrated. The anterior chamber was flushed with Miostat and the eye was inflated to physiologic pressure. 0.1 mL of cefuroxime concentration 10 mg/mL was placed in the anterior chamber. The wounds were found to be  water tight. The eye was dressed with Vigamox. The patient was given protective glasses to wear throughout the day and a shield with which to sleep tonight. The patient was also given drops with which to begin a drop regimen today and will follow-up with me in one day.  ____________________________ Livingston Diones. Zyree Traynham, MD wlp:sb D: 07/12/2012 15:51:17 ET T: 07/12/2012 16:41:58 ET JOB#: [id number]  cc: Nataley Bahri L. Lucinda Spells, MD, <Dictator> Livingston Diones Alita Waldren MD ELECTRONICALLY SIGNED 07/21/2012 14:28

## 2014-10-10 NOTE — Op Note (Signed)
PATIENT NAME:  Evan Dorsey, WOOLSEY MR#:  [id number] DATE OF BIRTH:  1933/02/18  DATE OF PROCEDURE:  06/12/2014  LOCATION: [facility]  PREOPERATIVE DIAGNOSIS:  Nuclear sclerotic cataract of the left eye.   POSTOPERATIVE DIAGNOSIS:  Nuclear sclerotic cataract of the left eye.   OPERATIVE PROCEDURE:  Cataract extraction by phacoemulsification with implant of intraocular lens to the left eye.   SURGEON:  Birder Robson, MD   ANESTHESIA:  1. Managed anesthesia care.  2. Topical tetracaine drops followed by 2% Xylocaine jelly applied in the preoperative holding area.   COMPLICATIONS:  None.   TECHNIQUE:   Stop and chop.  DESCRIPTION OF PROCEDURE:  The patient was examined and consented in the preoperative holding area where the aforementioned topical anesthesia was applied to the left eye and then brought back to the Operating Room where the left eye was prepped and draped in the usual sterile ophthalmic fashion and a lid speculum was placed. A paracentesis was created with the side port blade and the anterior chamber was filled with viscoelastic. A near clear corneal incision was performed with the steel keratome. A continuous curvilinear capsulorrhexis was performed with a cystotome followed by the capsulorrhexis forceps. Hydrodissection and hydrodelineation were carried out with BSS on a blunt cannula. The lens was removed in a stop-and-chop technique and the remaining cortical material was removed with the irrigation-aspiration handpiece. The capsular bag was inflated with viscoelastic and the Tecnis ZCB00 20.0-diopter lens, serial number [id number] was placed in the capsular bag without complication. Please note that a Malyugin ring was used to expand the pupil, as the patient dilated very poorly after a standard preoperative pharmacologic dilation.   The remaining viscoelastic was removed from the eye with the irrigation-aspiration handpiece. The wounds were hydrated. The anterior chamber was  flushed with Miostat and the eye was inflated to physiologic pressure. 0.1 mL of cefuroxime concentration 10 mg/mL was placed in the anterior chamber. The wounds were found to be water tight. The eye was dressed with Vigamox.   The patient was given protective glasses to wear throughout the day and a shield with which to sleep tonight. The patient was also given drops with which to begin a drop regimen today and will follow up with me in 1 day.    ____________________________ Livingston Diones. Roya Gieselman, MD wlp:MT D: 06/12/2014 13:51:23 ET T: 06/12/2014 14:38:50 ET JOB#: [id number]  cc: Chardae Mulkern L. Mordecai Tindol, MD, <Dictator>      Livingston Diones Ojas Coone MD ELECTRONICALLY SIGNED 06/18/2014 9:18

## 2014-11-19 ENCOUNTER — Other Ambulatory Visit: Payer: Self-pay | Admitting: Cardiology

## 2014-11-26 ENCOUNTER — Other Ambulatory Visit: Payer: Self-pay | Admitting: Cardiology

## 2014-12-08 ENCOUNTER — Other Ambulatory Visit: Payer: Self-pay | Admitting: Cardiology

## 2014-12-12 ENCOUNTER — Telehealth: Payer: Self-pay | Admitting: Cardiology

## 2014-12-12 MED ORDER — DABIGATRAN ETEXILATE MESYLATE 150 MG PO CAPS: 150.0000 mg | ORAL_CAPSULE | Freq: Two times a day (BID) | ORAL | Status: DC

## 2014-12-12 NOTE — Telephone Encounter (Signed)
Patient calling the office for samples of medication:   1.  What medication and dosage are you requesting samples for? Praxada 150mg    2.  Are you currently out of this medication? No he has a week left  3. Are you requesting samples to get you through until a mail order prescription arrives? No

## 2014-12-12 NOTE — Telephone Encounter (Signed)
Samples provided, patient contacted and informed samples available at front desk. Information acknowledged.

## 2014-12-27 ENCOUNTER — Other Ambulatory Visit: Payer: Self-pay | Admitting: Cardiology

## 2014-12-28 ENCOUNTER — Other Ambulatory Visit: Payer: Self-pay

## 2014-12-28 MED ORDER — AMIODARONE HCL 200 MG PO TABS: 200.0000 mg | ORAL_TABLET | Freq: Every day | ORAL | Status: DC

## 2015-01-14 DIAGNOSIS — H43813 Vitreous degeneration, bilateral: Secondary | ICD-10-CM | POA: Diagnosis not present

## 2015-01-28 DIAGNOSIS — I10 Essential (primary) hypertension: Secondary | ICD-10-CM | POA: Diagnosis not present

## 2015-01-28 DIAGNOSIS — N400 Enlarged prostate without lower urinary tract symptoms: Secondary | ICD-10-CM | POA: Diagnosis not present

## 2015-01-28 DIAGNOSIS — I4891 Unspecified atrial fibrillation: Secondary | ICD-10-CM | POA: Diagnosis not present

## 2015-02-04 ENCOUNTER — Telehealth: Payer: Self-pay | Admitting: Cardiology

## 2015-02-04 MED ORDER — DABIGATRAN ETEXILATE MESYLATE 150 MG PO CAPS: 150.0000 mg | ORAL_CAPSULE | Freq: Two times a day (BID) | ORAL | Status: DC

## 2015-02-04 NOTE — Telephone Encounter (Signed)
Patient calling the office for samples of medication:   1.  What medication and dosage are you requesting samples for? [name] 150mg   2.  Are you currently out of this medication? Yes    3. Are you requesting samples to get you through until a mail order prescription arrives? No

## 2015-02-04 NOTE — Telephone Encounter (Signed)
Pt will be picking up samples tomorrow

## 2015-02-05 DIAGNOSIS — Z8551 Personal history of malignant neoplasm of bladder: Secondary | ICD-10-CM | POA: Diagnosis not present

## 2015-02-05 DIAGNOSIS — C61 Malignant neoplasm of prostate: Secondary | ICD-10-CM | POA: Diagnosis not present

## 2015-02-07 NOTE — Telephone Encounter (Signed)
Can this encounter be closed?

## 2015-02-12 DIAGNOSIS — Z8551 Personal history of malignant neoplasm of bladder: Secondary | ICD-10-CM | POA: Diagnosis not present

## 2015-02-12 DIAGNOSIS — C61 Malignant neoplasm of prostate: Secondary | ICD-10-CM | POA: Diagnosis not present

## 2015-02-12 DIAGNOSIS — N390 Urinary tract infection, site not specified: Secondary | ICD-10-CM | POA: Diagnosis not present

## 2015-02-27 ENCOUNTER — Other Ambulatory Visit: Payer: Self-pay | Admitting: Cardiology

## 2015-02-27 ENCOUNTER — Other Ambulatory Visit: Payer: Self-pay | Admitting: Physician Assistant

## 2015-03-12 ENCOUNTER — Telehealth: Payer: Self-pay | Admitting: Cardiology

## 2015-03-12 NOTE — Telephone Encounter (Signed)
03/12/2015 Received faxed packet from [facility] for upcoming appointment on 03/15/2015 with Dr. Stanford Breed.  Records given to [facility]. cbr

## 2015-03-14 NOTE — Progress Notes (Signed)
HPI: Followup coronary artery disease status post coronary artery bypassing graft and atrial fibrillation. Patient had previous coronary artery bypassing graft in 1993. Last cardiac catheterization in 2005 showed occluded LAD, circumflex and right coronary artery. LIMA to the LAD was patent, saphenous vein graft to obtuse marginal patent. The saphenous vein graft to the PDA had a severe stenosis but there were collaterals from the obtuse marginal and PLA. Medical therapy recommended. Echocardiogram in March of 2012 showed normal LV function, mild to moderate left atrial enlargement, mild right atrial enlargement, trace aortic and mitral regurgitation. Nuclear study in January of 2014 showed no ischemia or infarction and his ejection fraction was 53%. Since he was last seen, the patient denies any dyspnea on exertion, orthopnea, PND, pedal edema, palpitations, syncope or chest pain.   Current Outpatient Prescriptions  Medication Sig Dispense Refill  . amiodarone (PACERONE) 200 MG tablet TAKE ONE TABLET EVERY DAY 30 tablet 1  . amLODipine (NORVASC) 5 MG tablet TAKE ONE TABLET BY MOUTH EVERY DAY 90 tablet 1  . calcium carbonate (TUMS - DOSED IN MG ELEMENTAL CALCIUM) 500 MG chewable tablet Chew 2 tablets by mouth daily.    . Cholecalciferol (VITAMIN D PO) Take 1 tablet by mouth daily.    . clobetasol cream (TEMOVATE) 0.05 %     . CRESTOR 40 MG tablet Take 20 mg by mouth daily.  3  . dabigatran ([name]) 150 MG CAPS capsule Take 1 capsule (150 mg total) by mouth 2 (two) times daily. 48 capsule 0  . ketoconazole (NIZORAL) 2 % cream     . Methylcellulose, Laxative, 500 MG TABS Take 2 tablets by mouth 2 (two) times daily.     Marland Kitchen NITROSTAT 0.4 MG SL tablet PLACE 1 TABLET UNDER THE TONGUE EVERY 5 MINUTES IF NEEDED FOR CHEST PAIN. MAX = 3 TABS/EPISODE. 25 tablet 0  . Saw Palmetto, Serenoa repens, (SAW PALMETTO PO) Take 2 capsules by mouth daily.    . tamsulosin (FLOMAX) 0.4 MG CAPS capsule Take 1  capsule by mouth daily.  6   No current facility-administered medications for this visit.     Past Medical History  Diagnosis Date  . Elevated PSA   . HLD (hyperlipidemia)   . Diverticulosis   . Gout     "been a long time ago" (03/02/2013)  . Adenomatous polyp   . HTN (hypertension)   . CAD (coronary artery disease)     a. remote CABG b. prior cardiac cath 2005 c. Lexiscan Myoview 07/08/12 w/o evidence of ischemia, EF 53%  . Bladder cancer 07/31/2011  . GERD (gastroesophageal reflux disease)   . Myocardial infarction 1990's    silent  . Atrial fibrillation   . Kidney stones     "passed all except one; they had to go get that one" (03/02/2013)  . Arthritis     "left shoulder" (03/02/2013)  . Frequent urination     "day and night" (03/02/2013)    Past Surgical History  Procedure Laterality Date  . Orif acetabular fracture Left 04/2007    trimalleolar fracture  . Cardiovascular stress test  02/10/08    6 mets, poor tolerance, no chest pain   . Cardiac catheterization      total occlusion to LAD, LCX, RCA. LIMA patent, normal seq SVG to OM, severe stenosis in SVG to PDA, but collaterals to PLA from OM. RCA went to PDA. treated medically   . Transurethral resection of bladder tumor  07/31/2011  Procedure: TRANSURETHRAL RESECTION OF BLADDER TUMOR (TURBT);  Surgeon: Claybon Jabs, MD;  Location: [facility];  Service: Urology;  Laterality: N/A;  with Gyrus  . Total shoulder arthroplasty Left 03/02/2013  . Coronary artery bypass graft  08/1991    "CABG X4" (03/02/2013)  . Cataract extraction w/ intraocular lens implant Right 2014  . Cystoscopy w/ stone manipulation    . Total shoulder arthroplasty Left 03/02/2013    Procedure: LEFT TOTAL SHOULDER ARTHROPLASTY;  Surgeon: Marin Shutter, MD;  Location: [place];  Service: Orthopedics;  Laterality: Left;    Social History   Social History  . Marital Status: Married    Spouse Name: N/A  . Number of Children: 3  . Years of Education: N/A    Occupational History  . retired Psychologist, sport and exercise    Social History Main Topics  . Smoking status: Never Smoker   . Smokeless tobacco: Never Used  . Alcohol Use: No  . Drug Use: No  . Sexual Activity: No   Other Topics Concern  . Not on file   Social History Narrative   Lives at home with wife.      ROS: no fevers or chills, productive cough, hemoptysis, dysphasia, odynophagia, melena, hematochezia, dysuria, hematuria, rash, seizure activity, orthopnea, PND, pedal edema, claudication. Remaining systems are negative.  Physical Exam: Well-developed well-nourished in no acute distress.  Skin is warm and dry.  HEENT is normal.  Neck is supple.  Chest is clear to auscultation with normal expansion.  Cardiovascular exam is regular rate and rhythm. 2/6 systolic ejection murmur Abdominal exam nontender or distended. No masses palpated. Extremities show no edema. neuro grossly intact  ECG sinus rhythm at a rate of 68. Right bundle branch block. Cannot rule out prior inferior infarct.

## 2015-03-15 ENCOUNTER — Ambulatory Visit (INDEPENDENT_AMBULATORY_CARE_PROVIDER_SITE_OTHER): Payer: Medicare Other | Admitting: Cardiology

## 2015-03-15 ENCOUNTER — Encounter: Payer: Self-pay | Admitting: Cardiology

## 2015-03-15 ENCOUNTER — Telehealth: Payer: Self-pay | Admitting: Cardiology

## 2015-03-15 DIAGNOSIS — I25810 Atherosclerosis of coronary artery bypass graft(s) without angina pectoris: Secondary | ICD-10-CM

## 2015-03-15 DIAGNOSIS — I480 Paroxysmal atrial fibrillation: Secondary | ICD-10-CM

## 2015-03-15 NOTE — Assessment & Plan Note (Signed)
Patient remains in sinus rhythm. Continue amiodarone. Continue pradaxa. Check TSH, liver functions and chest x-ray. Check hemoglobin and renal function.

## 2015-03-15 NOTE — Assessment & Plan Note (Signed)
Continue statin. 

## 2015-03-15 NOTE — Assessment & Plan Note (Signed)
Blood pressure controlled. Continue present medications. Check potassium and renal function. 

## 2015-03-15 NOTE — Telephone Encounter (Signed)
Patient couldn't get xrays and lab work doen at location they were sent to due to being closed. They would like to know asap if they need to do today or wait til a later date.  They are waiting on call before they head home since they live 40 miles from lab

## 2015-03-15 NOTE — Assessment & Plan Note (Addendum)
Continue statin. Not on aspirin given need for anticoagulation. 

## 2015-03-15 NOTE — Patient Instructions (Signed)
Your physician wants you to follow-up in: 6 MONTHS WITH DR CRENSHAW You will receive a reminder letter in the mail two months in advance. If you don't receive a letter, please call our office to schedule the follow-up appointment.   Your physician recommends that you HAVE LAB WORK TODAY  A chest x-ray takes a picture of the organs and structures inside the chest, including the heart, lungs, and blood vessels. This test can show several things, including, whether the heart is enlarges; whether fluid is building up in the lungs; and whether pacemaker / defibrillator leads are still in place. [address] 

## 2015-03-15 NOTE — Telephone Encounter (Signed)
Spoke with pt, aware okay to wait for labs and cxr.

## 2015-03-27 ENCOUNTER — Other Ambulatory Visit (INDEPENDENT_AMBULATORY_CARE_PROVIDER_SITE_OTHER): Payer: Medicare Other

## 2015-03-27 ENCOUNTER — Ambulatory Visit (INDEPENDENT_AMBULATORY_CARE_PROVIDER_SITE_OTHER)
Admission: RE | Admit: 2015-03-27 | Discharge: 2015-03-27 | Disposition: A | Discharge status: Home / Self Care | Payer: Medicare Other | Source: Ambulatory Visit | Attending: Cardiology | Admitting: Cardiology

## 2015-03-27 DIAGNOSIS — I480 Paroxysmal atrial fibrillation: Secondary | ICD-10-CM | POA: Diagnosis not present

## 2015-03-27 DIAGNOSIS — I10 Essential (primary) hypertension: Secondary | ICD-10-CM | POA: Diagnosis not present

## 2015-03-27 DIAGNOSIS — Z951 Presence of aortocoronary bypass graft: Secondary | ICD-10-CM | POA: Diagnosis not present

## 2015-03-27 LAB — HEPATIC FUNCTION PANEL
ALBUMIN: 4 g/dL (ref 3.5–5.2)
ALT: 19 U/L (ref 0–53)
AST: 21 U/L (ref 0–37)
Alkaline Phosphatase: 54 U/L (ref 39–117)
BILIRUBIN DIRECT: 0.2 mg/dL (ref 0.0–0.3)
TOTAL PROTEIN: 7 g/dL (ref 6.0–8.3)
Total Bilirubin: 0.5 mg/dL (ref 0.2–1.2)

## 2015-03-27 LAB — CBC
HCT: 41.4 % (ref 39.0–52.0)
Hemoglobin: 13.3 g/dL (ref 13.0–17.0)
MCHC: 32.1 g/dL (ref 30.0–36.0)
MCV: 86.6 fl (ref 78.0–100.0)
Platelets: 298 10*3/uL (ref 150.0–400.0)
RBC: 4.78 Mil/uL (ref 4.22–5.81)
RDW: 14.9 % (ref 11.5–15.5)
WBC: 8 10*3/uL (ref 4.0–10.5)

## 2015-03-27 LAB — BASIC METABOLIC PANEL
BUN: 11 mg/dL (ref 6–23)
CALCIUM: 9.3 mg/dL (ref 8.4–10.5)
CHLORIDE: 107 meq/L (ref 96–112)
CO2: 25 meq/L (ref 19–32)
Creatinine, Ser: 1.03 mg/dL (ref 0.40–1.50)
GFR: 73.74 mL/min (ref >60.00)
Glucose, Bld: 97 mg/dL (ref 70–99)
Potassium: 3.8 mEq/L (ref 3.5–5.1)
SODIUM: 142 meq/L (ref 135–145)

## 2015-03-27 LAB — TSH: TSH: 2.93 u[IU]/mL (ref 0.35–4.50)

## 2015-03-28 ENCOUNTER — Other Ambulatory Visit: Payer: Self-pay | Admitting: Cardiology

## 2015-04-25 DIAGNOSIS — H26492 Other secondary cataract, left eye: Secondary | ICD-10-CM | POA: Diagnosis not present

## 2015-04-29 ENCOUNTER — Other Ambulatory Visit: Payer: Self-pay | Admitting: Cardiology

## 2015-05-06 ENCOUNTER — Other Ambulatory Visit: Payer: Self-pay | Admitting: Cardiology

## 2015-05-16 ENCOUNTER — Telehealth: Payer: Self-pay | Admitting: Cardiology

## 2015-05-16 NOTE — Telephone Encounter (Signed)
Returned call to patient no answer.Left message on personal voice mail pradaxa 150 mg samples left at [facility] office front desk.

## 2015-05-16 NOTE — Telephone Encounter (Signed)
Evan Dorsey is calling for samples of [name] 150mg  ..Want to pick them up tomorrow .Marland Kitchen Please call   Thanks

## 2015-06-28 ENCOUNTER — Other Ambulatory Visit: Payer: Self-pay | Admitting: Cardiology

## 2015-07-23 DIAGNOSIS — C61 Malignant neoplasm of prostate: Secondary | ICD-10-CM | POA: Diagnosis not present

## 2015-07-29 ENCOUNTER — Other Ambulatory Visit: Payer: Self-pay | Admitting: Cardiology

## 2015-07-30 DIAGNOSIS — C61 Malignant neoplasm of prostate: Secondary | ICD-10-CM | POA: Diagnosis not present

## 2015-07-30 DIAGNOSIS — Z8551 Personal history of malignant neoplasm of bladder: Secondary | ICD-10-CM | POA: Diagnosis not present

## 2015-07-30 DIAGNOSIS — N390 Urinary tract infection, site not specified: Secondary | ICD-10-CM | POA: Diagnosis not present

## 2015-07-30 DIAGNOSIS — Z0000 Encounter for general adult medical examination without abnormal findings: Secondary | ICD-10-CM | POA: Diagnosis not present

## 2015-08-12 ENCOUNTER — Other Ambulatory Visit: Payer: Self-pay | Admitting: Cardiology

## 2015-08-12 NOTE — Telephone Encounter (Signed)
Returned call to patient's wife [name] 150 mg left at front desk of [facility] office.

## 2015-08-12 NOTE — Telephone Encounter (Signed)
Patient calling the office for samples of medication:   1.  What medication and dosage are you requesting samples for? [name]  2.  Are you currently out of this medication? No,just a few left

## 2015-08-15 DIAGNOSIS — Z0000 Encounter for general adult medical examination without abnormal findings: Secondary | ICD-10-CM | POA: Diagnosis not present

## 2015-08-20 DIAGNOSIS — H9319 Tinnitus, unspecified ear: Secondary | ICD-10-CM | POA: Diagnosis not present

## 2015-08-20 DIAGNOSIS — H6123 Impacted cerumen, bilateral: Secondary | ICD-10-CM | POA: Diagnosis not present

## 2015-08-20 DIAGNOSIS — I4891 Unspecified atrial fibrillation: Secondary | ICD-10-CM | POA: Diagnosis not present

## 2015-08-20 DIAGNOSIS — R05 Cough: Secondary | ICD-10-CM | POA: Diagnosis not present

## 2015-08-20 DIAGNOSIS — Z8546 Personal history of malignant neoplasm of prostate: Secondary | ICD-10-CM | POA: Diagnosis not present

## 2015-08-20 DIAGNOSIS — Z1212 Encounter for screening for malignant neoplasm of rectum: Secondary | ICD-10-CM | POA: Diagnosis not present

## 2015-08-20 DIAGNOSIS — I10 Essential (primary) hypertension: Secondary | ICD-10-CM | POA: Diagnosis not present

## 2015-08-20 DIAGNOSIS — Z8551 Personal history of malignant neoplasm of bladder: Secondary | ICD-10-CM | POA: Diagnosis not present

## 2015-09-25 ENCOUNTER — Other Ambulatory Visit: Payer: Self-pay | Admitting: Cardiology

## 2015-09-25 NOTE — Telephone Encounter (Signed)
Rx(s) sent to pharmacy electronically.  

## 2015-09-26 ENCOUNTER — Other Ambulatory Visit: Payer: Self-pay | Admitting: Cardiology

## 2015-09-26 NOTE — Telephone Encounter (Signed)
Rx(s) sent to pharmacy electronically.  

## 2015-10-23 DIAGNOSIS — E7800 Pure hypercholesterolemia, unspecified: Secondary | ICD-10-CM | POA: Diagnosis not present

## 2015-10-25 ENCOUNTER — Other Ambulatory Visit: Payer: Self-pay | Admitting: Cardiology

## 2015-11-08 ENCOUNTER — Other Ambulatory Visit: Payer: Self-pay | Admitting: Cardiology

## 2015-12-18 NOTE — Progress Notes (Signed)
HPI: Followup coronary artery disease status post coronary artery bypassing graft and atrial fibrillation. Patient had previous coronary artery bypassing graft in 1993. Last cardiac catheterization in 2005 showed occluded LAD, circumflex and right coronary artery. LIMA to the LAD was patent, saphenous vein graft to obtuse marginal patent. The saphenous vein graft to the PDA had a severe stenosis but there were collaterals from the obtuse marginal and PLA. Medical therapy recommended. Echocardiogram in March of 2012 showed normal LV function, mild to moderate left atrial enlargement, mild right atrial enlargement, trace aortic and mitral regurgitation. Nuclear study in January of 2014 showed no ischemia or infarction and his ejection fraction was 53%. Since he was last seen, He notes dyspnea on exertion when climbing hills. No orthopnea, PND, pedal edema, chest pain, palpitations, syncope or bleeding.  Current Outpatient Prescriptions  Medication Sig Dispense Refill  . amiodarone (PACERONE) 200 MG tablet TAKE ONE TABLET BY MOUTH DAILY 30 tablet 3  . amLODipine (NORVASC) 5 MG tablet TAKE ONE TABLET EVERY DAY 90 tablet 1  . calcium carbonate (TUMS - DOSED IN MG ELEMENTAL CALCIUM) 500 MG chewable tablet Chew 2 tablets by mouth daily.    . Cholecalciferol (VITAMIN D PO) Take 1 tablet by mouth daily.    . clobetasol cream (TEMOVATE) 0.05 %     . dabigatran ([name]) 150 MG CAPS capsule Take 1 capsule (150 mg total) by mouth 2 (two) times daily. 48 capsule 0  . ketoconazole (NIZORAL) 2 % cream     . Methylcellulose, Laxative, 500 MG TABS Take 2 tablets by mouth 2 (two) times daily.     Marland Kitchen NITROSTAT 0.4 MG SL tablet PLACE 1 TABLET UNDER THE TONGUE EVERY 5 MINUTES IF NEEDED FOR CHEST PAIN. MAX = 3 TABS/EPISODE. 25 tablet 0  . rosuvastatin (CRESTOR) 10 MG tablet Take 10 mg by mouth daily.    . tamsulosin (FLOMAX) 0.4 MG CAPS capsule Take 2 capsules by mouth daily.   6   No current  facility-administered medications for this visit.     Past Medical History  Diagnosis Date  . Elevated PSA   . HLD (hyperlipidemia)   . Diverticulosis   . Gout     "been a long time ago" (03/02/2013)  . Adenomatous polyp   . HTN (hypertension)   . CAD (coronary artery disease)     a. remote CABG b. prior cardiac cath 2005 c. Lexiscan Myoview 07/08/12 w/o evidence of ischemia, EF 53%  . Bladder cancer 07/31/2011  . GERD (gastroesophageal reflux disease)   . Myocardial infarction ([place]) 1990's    silent  . Atrial fibrillation   . Kidney stones     "passed all except one; they had to go get that one" (03/02/2013)  . Arthritis     "left shoulder" (03/02/2013)  . Frequent urination     "day and night" (03/02/2013)    Past Surgical History  Procedure Laterality Date  . Orif acetabular fracture Left 04/2007    trimalleolar fracture  . Cardiovascular stress test  02/10/08    6 mets, poor tolerance, no chest pain   . Cardiac catheterization      total occlusion to LAD, LCX, RCA. LIMA patent, normal seq SVG to OM, severe stenosis in SVG to PDA, but collaterals to PLA from OM. RCA went to PDA. treated medically   . Transurethral resection of bladder tumor  07/31/2011    Procedure: TRANSURETHRAL RESECTION OF BLADDER TUMOR (TURBT);  Surgeon: Claybon Jabs,  MD;  Location: [facility];  Service: Urology;  Laterality: N/A;  with Gyrus  . Total shoulder arthroplasty Left 03/02/2013  . Coronary artery bypass graft  08/1991    "CABG X4" (03/02/2013)  . Cataract extraction w/ intraocular lens implant Right 2014  . Cystoscopy w/ stone manipulation    . Total shoulder arthroplasty Left 03/02/2013    Procedure: LEFT TOTAL SHOULDER ARTHROPLASTY;  Surgeon: Marin Shutter, MD;  Location: [place];  Service: Orthopedics;  Laterality: Left;    Social History   Social History  . Marital Status: Married    Spouse Name: N/A  . Number of Children: 3  . Years of Education: N/A   Occupational History  . retired  Psychologist, sport and exercise    Social History Main Topics  . Smoking status: Never Smoker   . Smokeless tobacco: Never Used  . Alcohol Use: No  . Drug Use: No  . Sexual Activity: No   Other Topics Concern  . Not on file   Social History Narrative   Lives at home with wife.      Family History  Problem Relation Age of Onset  . CAD Father 67  . CAD Brother 89  . CAD Brother 49    ROS: no fevers or chills, productive cough, hemoptysis, dysphasia, odynophagia, melena, hematochezia, dysuria, hematuria, rash, seizure activity, orthopnea, PND, pedal edema, claudication. Remaining systems are negative.  Physical Exam: Well-developed well-nourished in no acute distress.  Skin is warm and dry.  HEENT is normal.  Neck is supple.  Chest is clear to auscultation with normal expansion.  Cardiovascular exam is regular rate and rhythm.  Abdominal exam nontender or distended. No masses palpated. Extremities show no edema. neuro grossly intact  ECG Sinus rhythm at a rate of 58. Right bundle branch block.  A/P  1 Coronary artery disease-continue statin. No aspirin Given need for anticoagulation. 2 dyspnea-notes some increased dyspnea on exertion. He had dyspnea prior to his bypass. Plan stress nuclear study for risk stratification. 3 paroxysmal atrial fibrillation-patient remains in sinus rhythm. Continue amiodarone. Check TSH, liver functions and chest x-ray. Continue pradaxa. Check hemoglobin and renal function. 4 hypertension-blood pressure controlled. Continue present medications. 5 hyperlipidemia-continue statin.  [name], MD

## 2015-12-23 ENCOUNTER — Ambulatory Visit (INDEPENDENT_AMBULATORY_CARE_PROVIDER_SITE_OTHER): Payer: Medicare Other | Admitting: Cardiology

## 2015-12-23 VITALS — BP 140/70 | HR 58 | Ht 71.0 in | Wt 226.4 lb

## 2015-12-23 DIAGNOSIS — I25810 Atherosclerosis of coronary artery bypass graft(s) without angina pectoris: Secondary | ICD-10-CM | POA: Diagnosis not present

## 2015-12-23 DIAGNOSIS — I480 Paroxysmal atrial fibrillation: Secondary | ICD-10-CM | POA: Diagnosis not present

## 2015-12-23 LAB — HEPATIC FUNCTION PANEL
ALBUMIN: 4 g/dL (ref 3.6–5.1)
ALT: 27 U/L (ref 9–46)
AST: 28 U/L (ref 10–35)
Alkaline Phosphatase: 54 U/L (ref 40–115)
BILIRUBIN DIRECT: 0.2 mg/dL (ref <=0.2)
BILIRUBIN TOTAL: 0.6 mg/dL (ref 0.2–1.2)
Indirect Bilirubin: 0.4 mg/dL (ref 0.2–1.2)
Total Protein: 6.6 g/dL (ref 6.1–8.1)

## 2015-12-23 LAB — CBC
HCT: 41.4 % (ref 38.5–50.0)
Hemoglobin: 13.2 g/dL (ref 13.2–17.1)
MCH: 27.8 pg (ref 27.0–33.0)
MCHC: 31.9 g/dL — AB (ref 32.0–36.0)
MCV: 87.2 fL (ref 80.0–100.0)
MPV: 10.2 fL (ref 7.5–12.5)
PLATELETS: 302 10*3/uL (ref 140–400)
RBC: 4.75 MIL/uL (ref 4.20–5.80)
RDW: 14.8 % (ref 11.0–15.0)
WBC: 9.1 10*3/uL (ref 3.8–10.8)

## 2015-12-23 LAB — BASIC METABOLIC PANEL WITH GFR
BUN: 12 mg/dL (ref 7–25)
CALCIUM: 9 mg/dL (ref 8.6–10.3)
CO2: 28 mmol/L (ref 20–31)
CREATININE: 1.1 mg/dL (ref 0.70–1.11)
Chloride: 105 mmol/L (ref 98–110)
GFR, Est African American: 72 mL/min (ref >=60)
GFR, Est Non African American: 63 mL/min (ref >=60)
Glucose, Bld: 91 mg/dL (ref 65–99)
Potassium: 4.3 mmol/L (ref 3.5–5.3)
SODIUM: 141 mmol/L (ref 135–146)

## 2015-12-23 LAB — TSH: TSH: 3.98 m[IU]/L (ref 0.40–4.50)

## 2015-12-23 NOTE — Patient Instructions (Signed)
Your physician wants you to follow-up in: 6 months with Dr. Stanford Breed. You will receive a reminder letter in the mail two months in advance. If you don't receive a letter, please call our office to schedule the follow-up appointment.   Your physician has requested that you have an exercise stress myoview. For further information please visit [email or web address]. Please follow instruction sheet, as given.   A chest x-ray takes a picture of the organs and structures inside the chest, including the heart, lungs, and blood vessels. This test can show several things, including, whether the heart is enlarges; whether fluid is building up in the lungs; and whether pacemaker / defibrillator leads are still in place.   Your physician recommends that you return for lab work.   Can replace [name] with Xarelto or Eliquis--check with Insurance.  If you need a refill on your cardiac medications before your next appointment, please call your pharmacy.

## 2015-12-24 ENCOUNTER — Telehealth (HOSPITAL_COMMUNITY): Payer: Self-pay

## 2015-12-24 NOTE — Telephone Encounter (Signed)
Encounter complete. 

## 2015-12-25 ENCOUNTER — Other Ambulatory Visit: Payer: Self-pay | Admitting: Cardiology

## 2015-12-26 ENCOUNTER — Ambulatory Visit (HOSPITAL_COMMUNITY)
Admission: RE | Admit: 2015-12-26 | Discharge: 2015-12-26 | Disposition: A | Discharge status: Home / Self Care | Payer: Medicare Other | Source: Ambulatory Visit | Attending: Cardiology | Admitting: Cardiology

## 2015-12-26 DIAGNOSIS — Z8249 Family history of ischemic heart disease and other diseases of the circulatory system: Secondary | ICD-10-CM | POA: Insufficient documentation

## 2015-12-26 DIAGNOSIS — I10 Essential (primary) hypertension: Secondary | ICD-10-CM | POA: Diagnosis not present

## 2015-12-26 DIAGNOSIS — I25810 Atherosclerosis of coronary artery bypass graft(s) without angina pectoris: Secondary | ICD-10-CM | POA: Diagnosis not present

## 2015-12-26 DIAGNOSIS — I4510 Unspecified right bundle-branch block: Secondary | ICD-10-CM | POA: Insufficient documentation

## 2015-12-26 DIAGNOSIS — Z6831 Body mass index (BMI) 31.0-31.9, adult: Secondary | ICD-10-CM | POA: Insufficient documentation

## 2015-12-26 DIAGNOSIS — R0609 Other forms of dyspnea: Secondary | ICD-10-CM | POA: Insufficient documentation

## 2015-12-26 DIAGNOSIS — E669 Obesity, unspecified: Secondary | ICD-10-CM | POA: Diagnosis not present

## 2015-12-26 DIAGNOSIS — R9439 Abnormal result of other cardiovascular function study: Secondary | ICD-10-CM | POA: Diagnosis not present

## 2015-12-26 LAB — MYOCARDIAL PERFUSION IMAGING
CHL CUP NUCLEAR SDS: 6
CHL CUP NUCLEAR SRS: 2
CHL CUP NUCLEAR SSS: 8
CHL CUP RESTING HR STRESS: 55 {beats}/min
LV sys vol: 47 mL
LVDIAVOL: 114 mL (ref 62–150)
NUC STRESS TID: 0.87
Peak HR: 64 {beats}/min

## 2015-12-26 MED ORDER — REGADENOSON 0.4 MG/5ML IV SOLN
0.4000 mg | Freq: Once | INTRAVENOUS | Status: AC
  Administered 2015-12-26: 0.4 mg via INTRAVENOUS

## 2015-12-26 MED ORDER — TECHNETIUM TC 99M TETROFOSMIN IV KIT
28.0000 | PACK | Freq: Once | INTRAVENOUS | Status: AC | PRN
  Administered 2015-12-26: 28 via INTRAVENOUS
  Filled 2015-12-26: qty 28

## 2015-12-26 MED ORDER — TECHNETIUM TC 99M TETROFOSMIN IV KIT
9.7000 | PACK | Freq: Once | INTRAVENOUS | Status: AC | PRN
  Administered 2015-12-26: 10 via INTRAVENOUS
  Filled 2015-12-26: qty 10

## 2016-01-17 ENCOUNTER — Telehealth: Payer: Self-pay | Admitting: Cardiology

## 2016-01-17 MED ORDER — APIXABAN 5 MG PO TABS
5.0000 mg | ORAL_TABLET | Freq: Two times a day (BID) | ORAL | 6 refills | Status: DC
Start: 2016-01-17 — End: 2016-07-22

## 2016-01-17 NOTE — Telephone Encounter (Signed)
Message routed to MD

## 2016-01-17 NOTE — Telephone Encounter (Signed)
New message     Pt c/o medication issue:  1. Name of Medication: [name]  2. How are you currently taking this medication (dosage and times per day)? 150 mg po twice daily  3. Are you having a reaction (difficulty breathing--STAT)?no  4. What is your medication issue? The pt told the pharmacy he spoke with Dr. Stanford Breed to change to Eliquis or Xarelto to save money, the pharmacy wants to if it can be switched

## 2016-01-17 NOTE — Telephone Encounter (Signed)
New script sent to the pharmacy

## 2016-01-17 NOTE — Telephone Encounter (Signed)
Dc pradaxa, apixaban 5 mg bid [name]

## 2016-01-24 DIAGNOSIS — C61 Malignant neoplasm of prostate: Secondary | ICD-10-CM | POA: Diagnosis not present

## 2016-02-04 DIAGNOSIS — Z8551 Personal history of malignant neoplasm of bladder: Secondary | ICD-10-CM | POA: Diagnosis not present

## 2016-02-04 DIAGNOSIS — R3915 Urgency of urination: Secondary | ICD-10-CM | POA: Diagnosis not present

## 2016-02-04 DIAGNOSIS — C61 Malignant neoplasm of prostate: Secondary | ICD-10-CM | POA: Diagnosis not present

## 2016-02-06 DIAGNOSIS — L308 Other specified dermatitis: Secondary | ICD-10-CM | POA: Diagnosis not present

## 2016-02-06 DIAGNOSIS — B353 Tinea pedis: Secondary | ICD-10-CM | POA: Diagnosis not present

## 2016-02-10 ENCOUNTER — Other Ambulatory Visit: Payer: Self-pay

## 2016-02-25 ENCOUNTER — Other Ambulatory Visit: Payer: Self-pay | Admitting: Cardiology

## 2016-05-11 ENCOUNTER — Other Ambulatory Visit: Payer: Self-pay | Admitting: Cardiology

## 2016-05-11 NOTE — Telephone Encounter (Signed)
Patient calling the office for samples of medication:   1.  What medication and dosage are you requesting samples for? Evan Dorsey 5 mg  2.  Are you currently out of this medication? No,he has 2 left

## 2016-05-12 NOTE — Telephone Encounter (Signed)
Unable to reach pt or leave a message mailbox has not been set up.

## 2016-05-12 NOTE — Telephone Encounter (Signed)
No samples 

## 2016-05-13 NOTE — Telephone Encounter (Signed)
Spoke with Rosana Berger, she states that she will call back pt has some left. States that pt has current rx, if needed

## 2016-07-22 ENCOUNTER — Other Ambulatory Visit: Payer: Self-pay | Admitting: Cardiology

## 2016-07-29 DIAGNOSIS — C61 Malignant neoplasm of prostate: Secondary | ICD-10-CM | POA: Diagnosis not present

## 2016-08-21 ENCOUNTER — Telehealth: Payer: Self-pay | Admitting: Cardiology

## 2016-08-21 NOTE — Telephone Encounter (Signed)
Patient calling the office for samples of medication:   1.  What medication and dosage are you requesting samples for? Eliquis 5mg    2.  Are you currently out of this medication? Only have about 2-3 left   Will pick up on Monday if we have any .Marland Kitchen Thanks

## 2016-08-21 NOTE — Telephone Encounter (Signed)
Medication samples have been provided to the patient.  Drug name: eliquis 5mg   Qty: 4 boxes  LOT: [id number]  Exp.Date: 08/2018  Samples left at front desk for patient pick-up. Patient notified.  Scheduled for overdue 6 month visit  Evan Dorsey 12:58 PM 08/21/2016

## 2016-08-27 DIAGNOSIS — N390 Urinary tract infection, site not specified: Secondary | ICD-10-CM | POA: Diagnosis not present

## 2016-08-27 DIAGNOSIS — Z0000 Encounter for general adult medical examination without abnormal findings: Secondary | ICD-10-CM | POA: Diagnosis not present

## 2016-08-27 DIAGNOSIS — E7800 Pure hypercholesterolemia, unspecified: Secondary | ICD-10-CM | POA: Diagnosis not present

## 2016-08-27 DIAGNOSIS — I10 Essential (primary) hypertension: Secondary | ICD-10-CM | POA: Diagnosis not present

## 2016-08-28 DIAGNOSIS — I4891 Unspecified atrial fibrillation: Secondary | ICD-10-CM | POA: Diagnosis not present

## 2016-08-28 DIAGNOSIS — R350 Frequency of micturition: Secondary | ICD-10-CM | POA: Diagnosis not present

## 2016-08-28 DIAGNOSIS — C61 Malignant neoplasm of prostate: Secondary | ICD-10-CM | POA: Diagnosis not present

## 2016-08-28 DIAGNOSIS — I10 Essential (primary) hypertension: Secondary | ICD-10-CM | POA: Diagnosis not present

## 2016-09-18 ENCOUNTER — Other Ambulatory Visit: Payer: Self-pay | Admitting: Cardiology

## 2016-10-19 ENCOUNTER — Emergency Department (HOSPITAL_COMMUNITY)
Admission: EM | Admit: 2016-10-19 | Discharge: 2016-10-19 | Disposition: A | Discharge status: Home / Self Care | Payer: Medicare Other | Attending: Emergency Medicine | Admitting: Emergency Medicine

## 2016-10-19 ENCOUNTER — Encounter (HOSPITAL_COMMUNITY): Payer: Self-pay

## 2016-10-19 ENCOUNTER — Emergency Department (HOSPITAL_COMMUNITY): Payer: Medicare Other

## 2016-10-19 DIAGNOSIS — I2510 Atherosclerotic heart disease of native coronary artery without angina pectoris: Secondary | ICD-10-CM | POA: Insufficient documentation

## 2016-10-19 DIAGNOSIS — R1013 Epigastric pain: Secondary | ICD-10-CM

## 2016-10-19 DIAGNOSIS — Z951 Presence of aortocoronary bypass graft: Secondary | ICD-10-CM | POA: Insufficient documentation

## 2016-10-19 DIAGNOSIS — K5730 Diverticulosis of large intestine without perforation or abscess without bleeding: Secondary | ICD-10-CM | POA: Diagnosis not present

## 2016-10-19 DIAGNOSIS — R109 Unspecified abdominal pain: Secondary | ICD-10-CM | POA: Diagnosis not present

## 2016-10-19 DIAGNOSIS — I10 Essential (primary) hypertension: Secondary | ICD-10-CM | POA: Diagnosis not present

## 2016-10-19 DIAGNOSIS — N201 Calculus of ureter: Secondary | ICD-10-CM | POA: Diagnosis not present

## 2016-10-19 DIAGNOSIS — Z96612 Presence of left artificial shoulder joint: Secondary | ICD-10-CM | POA: Insufficient documentation

## 2016-10-19 DIAGNOSIS — Z8551 Personal history of malignant neoplasm of bladder: Secondary | ICD-10-CM | POA: Insufficient documentation

## 2016-10-19 DIAGNOSIS — Z79899 Other long term (current) drug therapy: Secondary | ICD-10-CM | POA: Diagnosis not present

## 2016-10-19 DIAGNOSIS — K439 Ventral hernia without obstruction or gangrene: Secondary | ICD-10-CM | POA: Diagnosis not present

## 2016-10-19 DIAGNOSIS — I700 Atherosclerosis of aorta: Secondary | ICD-10-CM | POA: Diagnosis not present

## 2016-10-19 DIAGNOSIS — I252 Old myocardial infarction: Secondary | ICD-10-CM | POA: Insufficient documentation

## 2016-10-19 DIAGNOSIS — Q628 Other congenital malformations of ureter: Secondary | ICD-10-CM | POA: Diagnosis not present

## 2016-10-19 DIAGNOSIS — I4891 Unspecified atrial fibrillation: Secondary | ICD-10-CM | POA: Diagnosis not present

## 2016-10-19 DIAGNOSIS — Z7901 Long term (current) use of anticoagulants: Secondary | ICD-10-CM | POA: Diagnosis not present

## 2016-10-19 DIAGNOSIS — K2970 Gastritis, unspecified, without bleeding: Secondary | ICD-10-CM | POA: Diagnosis not present

## 2016-10-19 DIAGNOSIS — N132 Hydronephrosis with renal and ureteral calculous obstruction: Secondary | ICD-10-CM | POA: Diagnosis not present

## 2016-10-19 DIAGNOSIS — R10817 Generalized abdominal tenderness: Secondary | ICD-10-CM | POA: Diagnosis not present

## 2016-10-19 LAB — COMPREHENSIVE METABOLIC PANEL
ALBUMIN: 3.8 g/dL (ref 3.5–5.0)
ALT: 33 U/L (ref 17–63)
AST: 33 U/L (ref 15–41)
Alkaline Phosphatase: 51 U/L (ref 38–126)
Anion gap: 9 (ref 5–15)
BUN: 13 mg/dL (ref 6–20)
CO2: 22 mmol/L (ref 22–32)
Calcium: 9 mg/dL (ref 8.9–10.3)
Chloride: 106 mmol/L (ref 101–111)
Creatinine, Ser: 1.52 mg/dL — ABNORMAL HIGH (ref 0.61–1.24)
GFR calc Af Amer: 47 mL/min — ABNORMAL LOW (ref >60)
GFR calc non Af Amer: 41 mL/min — ABNORMAL LOW (ref >60)
GLUCOSE: 113 mg/dL — AB (ref 65–99)
POTASSIUM: 3.7 mmol/L (ref 3.5–5.1)
SODIUM: 137 mmol/L (ref 135–145)
Total Bilirubin: 1 mg/dL (ref 0.3–1.2)
Total Protein: 6.7 g/dL (ref 6.5–8.1)

## 2016-10-19 LAB — URINALYSIS, ROUTINE W REFLEX MICROSCOPIC
Bilirubin Urine: NEGATIVE
GLUCOSE, UA: NEGATIVE mg/dL
Ketones, ur: 5 mg/dL — AB
Nitrite: NEGATIVE
PH: 5 (ref 5.0–8.0)
PROTEIN: 30 mg/dL — AB
Specific Gravity, Urine: 1.02 (ref 1.005–1.030)

## 2016-10-19 LAB — LIPASE, BLOOD: Lipase: 20 U/L (ref 11–51)

## 2016-10-19 LAB — CBC WITH DIFFERENTIAL/PLATELET
BAND NEUTROPHILS: 0 %
BASOS PCT: 0 %
Basophils Absolute: 0 10*3/uL (ref 0.0–0.1)
Blasts: 0 %
EOS ABS: 0 10*3/uL (ref 0.0–0.7)
Eosinophils Relative: 0 %
HCT: 44.5 % (ref 39.0–52.0)
Hemoglobin: 13.9 g/dL (ref 13.0–17.0)
Lymphocytes Relative: 6 %
Lymphs Abs: 0.7 10*3/uL (ref 0.7–4.0)
MCH: 27.6 pg (ref 26.0–34.0)
MCHC: 31.2 g/dL (ref 30.0–36.0)
MCV: 88.3 fL (ref 78.0–100.0)
MONO ABS: 2 10*3/uL — AB (ref 0.1–1.0)
MONOS PCT: 17 %
Metamyelocytes Relative: 0 %
Myelocytes: 0 %
NEUTROS ABS: 9.2 10*3/uL — AB (ref 1.7–7.7)
Neutrophils Relative %: 77 %
OTHER: 0 %
PLATELETS: 228 10*3/uL (ref 150–400)
Promyelocytes Absolute: 0 %
RBC: 5.04 MIL/uL (ref 4.22–5.81)
RDW: 15.6 % — AB (ref 11.5–15.5)
WBC: 11.9 10*3/uL — ABNORMAL HIGH (ref 4.0–10.5)
nRBC: 0 /100 WBC

## 2016-10-19 MED ORDER — CEPHALEXIN 250 MG PO CAPS: 250.0000 mg | ORAL_CAPSULE | Freq: Four times a day (QID) | ORAL | 0 refills | Status: DC

## 2016-10-19 MED ORDER — IOPAMIDOL (ISOVUE-300) INJECTION 61%
INTRAVENOUS | Status: AC
Start: 2016-10-19 — End: 2016-10-19
  Administered 2016-10-19: 75 mL
  Filled 2016-10-19: qty 75

## 2016-10-19 MED ORDER — SODIUM CHLORIDE 0.9 % IV BOLUS (SEPSIS)
500.0000 mL | Freq: Once | INTRAVENOUS | Status: AC
  Administered 2016-10-19: 500 mL via INTRAVENOUS

## 2016-10-19 MED ORDER — MORPHINE SULFATE (PF) 4 MG/ML IV SOLN
2.0000 mg | Freq: Once | INTRAVENOUS | Status: AC
  Administered 2016-10-19: 2 mg via INTRAVENOUS
  Filled 2016-10-19: qty 1

## 2016-10-19 NOTE — ED Notes (Signed)
Pt ambulates to br with steady gait.

## 2016-10-19 NOTE — ED Provider Notes (Addendum)
Evan Dorsey Provider Note   CSN: [id number] Arrival date & time: 10/19/16  1147     History   Chief Complaint Chief Complaint  Patient presents with  . Abdominal Pain    HPI Evan Dorsey is a 83 y.o. male.  HPI  83 year old man comes in today stating that he has a hernia. He has had some right back pain and epigastric discomfort since Saturday night. He has noticed some tenderness and swelling in the epigastric region. He denies nausea, vomiting, diarrhea, or constipation. He states that he was seen by his primary care doctor and he was told that he has a hernia. He states he had some back pain consistent with previous kidney stones but these 2 episodes came and went over the past 2 days. He has not noted any difficulty with urination. He had a normal bowel movement yesterday was somewhat decreased from normal today. He has not had any diarrhea. He has had a good appetite. Eating does not increase the pain. He has not had any vomiting.  Past Medical History:  Diagnosis Date  . Adenomatous polyp   . Arthritis    "left shoulder" (03/02/2013)  . Atrial fibrillation ([place])   . Bladder cancer ([place]) 07/31/2011  . CAD (coronary artery disease)    a. remote CABG b. prior cardiac cath 2005 c. Lexiscan Myoview 07/08/12 w/o evidence of ischemia, EF 53%  . Diverticulosis   . Elevated PSA   . Frequent urination    "day and night" (03/02/2013)  . GERD (gastroesophageal reflux disease)   . Gout    "been a long time ago" (03/02/2013)  . HLD (hyperlipidemia)   . HTN (hypertension)   . Kidney stones    "passed all except one; they had to go get that one" (03/02/2013)  . Myocardial infarction [facility]) 1990's   silent    Patient Active Problem List   Diagnosis Date Noted  . Preop cardiovascular exam 02/10/2013  . Paroxysmal atrial fibrillation ([place]) 07/09/2012  . Hypokalemia 07/09/2012  . Precordial pain 07/07/2012  . Bladder cancer ([place]) 07/31/2011  . Hematuria 04/22/2011  .  Atrial fibrillation ([place]) 08/29/2010  . HYPERCHOLESTEROLEMIA 02/25/2010  . HYPERLIPIDEMIA-MIXED 02/25/2010  . HYPERTENSION, BENIGN 02/25/2010  . CORONARY ATHEROSCLEROSIS NATIVE CORONARY ARTERY 02/25/2010  . CAD, ARTERY BYPASS GRAFT 02/25/2010    Past Surgical History:  Procedure Laterality Date  . CARDIAC CATHETERIZATION     total occlusion to LAD, LCX, RCA. LIMA patent, normal seq SVG to OM, severe stenosis in SVG to PDA, but collaterals to PLA from OM. RCA went to PDA. treated medically   . CARDIOVASCULAR STRESS TEST  02/10/08   6 mets, poor tolerance, no chest pain   . CATARACT EXTRACTION W/ INTRAOCULAR LENS IMPLANT Right 2014  . CORONARY ARTERY BYPASS GRAFT  08/1991   "CABG X4" (03/02/2013)  . CYSTOSCOPY W/ STONE MANIPULATION    . ORIF ACETABULAR FRACTURE Left 04/2007   trimalleolar fracture  . TOTAL SHOULDER ARTHROPLASTY Left 03/02/2013  . TOTAL SHOULDER ARTHROPLASTY Left 03/02/2013   Procedure: LEFT TOTAL SHOULDER ARTHROPLASTY;  Surgeon: Marin Shutter, MD;  Location: [place];  Service: Orthopedics;  Laterality: Left;  . TRANSURETHRAL RESECTION OF BLADDER TUMOR  07/31/2011   Procedure: TRANSURETHRAL RESECTION OF BLADDER TUMOR (TURBT);  Surgeon: Claybon Jabs, MD;  Location: [facility];  Service: Urology;  Laterality: N/A;  with [facility]       Home Medications    Prior to Admission medications   Medication Sig Start Date  End Date Taking? Authorizing Provider  amiodarone (PACERONE) 200 MG tablet TAKE ONE TABLET BY MOUTH DAILY 02/25/16   Lelon Perla, MD  amLODipine (NORVASC) 5 MG tablet TAKE ONE TABLET BY MOUTH EVERY DAY 09/21/16   Lelon Perla, MD  calcium carbonate (TUMS - DOSED IN MG ELEMENTAL CALCIUM) 500 MG chewable tablet Chew 2 tablets by mouth daily.    [provider]  Cholecalciferol (VITAMIN D PO) Take 1 tablet by mouth daily.    [provider]  clobetasol cream (TEMOVATE) 0.05 %  06/30/13   [provider]  ELIQUIS 5 MG TABS tablet TAKE ONE  TABLET BY MOUTH TWICE DAILY 07/22/16   Lelon Perla, MD  ketoconazole (NIZORAL) 2 % cream  06/30/13   [provider]  Methylcellulose, Laxative, 500 MG TABS Take 2 tablets by mouth 2 (two) times daily.     [provider]  NITROSTAT 0.4 MG SL tablet PLACE 1 TABLET UNDER THE TONGUE EVERY 5 MINUTES IF NEEDED FOR CHEST PAIN. MAX = 3 TABS/EPISODE. 02/27/15   Josue Hector, MD  rosuvastatin (CRESTOR) 10 MG tablet Take 10 mg by mouth daily.    [provider]  tamsulosin (FLOMAX) 0.4 MG CAPS capsule Take 2 capsules by mouth daily.  07/31/14   [provider]    Family History Family History  Problem Relation Age of Onset  . CAD Father 23  . CAD Brother 6  . CAD Brother 1    Social History Social History  Substance Use Topics  . Smoking status: Never Smoker  . Smokeless tobacco: Never Used  . Alcohol use No     Allergies   Patient has no known allergies.   Review of Systems Review of Systems  All other systems reviewed and are negative.    Physical Exam Updated Vital Signs BP (!) 142/63   Pulse 67   Temp 98.2 F (36.8 C) (Oral)   Resp 16   SpO2 95%   Physical Exam  Constitutional: He is oriented to person, place, and time. He appears well-developed and well-nourished. No distress.  HENT:  Head: Normocephalic and atraumatic.  Right Ear: External ear normal.  Left Ear: External ear normal.  Eyes: EOM are normal. Pupils are equal, round, and reactive to light.  Neck: Normal range of motion. Neck supple.  Cardiovascular: Normal rate, regular rhythm, normal heart sounds and intact distal pulses.   Pulmonary/Chest: Effort normal and breath sounds normal.  Abdominal: Soft. Bowel sounds are normal. There is tenderness.  Mass noted in epigastrium and is tender.  Musculoskeletal: Normal range of motion.  Neurological: He is alert and oriented to person, place, and time.  Skin: Skin is warm and dry. Capillary refill takes less than 2  seconds.  Nursing note and vitals reviewed.    ED Treatments / Results  Labs (all labs ordered are listed, but only abnormal results are displayed) Labs Reviewed  CBC WITH DIFFERENTIAL/PLATELET - Abnormal; Notable for the following:       Result Value   WBC 11.9 (*)    RDW 15.6 (*)    Neutro Abs 9.2 (*)    Monocytes Absolute 2.0 (*)    All other components within normal limits  COMPREHENSIVE METABOLIC PANEL - Abnormal; Notable for the following:    Glucose, Bld 113 (*)    Creatinine, Ser 1.52 (*)    GFR calc non Af Amer 41 (*)    GFR calc Af Amer 47 (*)  All other components within normal limits  URINALYSIS, ROUTINE W REFLEX MICROSCOPIC - Abnormal; Notable for the following:    APPearance HAZY (*)    Hgb urine dipstick LARGE (*)    Ketones, ur 5 (*)    Protein, ur 30 (*)    Leukocytes, UA TRACE (*)    Bacteria, UA RARE (*)    Squamous Epithelial / LPF 0-5 (*)    All other components within normal limits  LIPASE, BLOOD    EKG  EKG Interpretation None       Radiology Ct Abdomen Pelvis W Contrast  Result Date: 10/19/2016 CLINICAL DATA:  83 year old male with history of epigastric pain for the past several days, worsening at night while trying to sleep. EXAM: CT ABDOMEN AND PELVIS WITH CONTRAST TECHNIQUE: Multidetector CT imaging of the abdomen and pelvis was performed using the standard protocol following bolus administration of intravenous contrast. CONTRAST:  38mL ISOVUE-300 IOPAMIDOL (ISOVUE-300) INJECTION 61% COMPARISON:  CT the abdomen and pelvis 05/28/2011. FINDINGS: Lower chest: Atherosclerotic calcifications in the left anterior descending, left circumflex and right coronary arteries. Status post median sternotomy for CABG. Calcifications of the aortic valve. Hepatobiliary: Subcentimeter low-attenuation lesion in segment 2 of the liver is too small to characterize, but is statistically likely a tiny cyst. Amorphous area of low attenuation measuring 2.1 cm in segment  3 (image 26 of series 3) is indeterminate. No intra or extrahepatic biliary ductal dilatation. Small calcified gallstones lie dependently in the gallbladder. No findings to suggest an acute cholecystitis at this time. Pancreas: No pancreatic mass. No pancreatic ductal dilatation. No pancreatic or peripancreatic fluid or inflammatory changes. Spleen: Unremarkable. Adrenals/Urinary Tract: In the middle third of the right ureter (axial image 52 of series 3) there is an 11 mm calculus which is associated with mild proximal right hydroureteronephrosis. Interestingly, the right ureter appears to completely encircle the inferior vena cava, progressing posterior to the cava in the proximal third, lying medial and anterior to the cava in the middle third, and an extending lateral to the cava in the distal third. No additional calculi are noted within the collecting system of either kidney, along the course of the left ureter or within the lumen of the urinary bladder. Exophytic 4.1 cm simple cyst in the interpolar region of the right kidney. Other subcentimeter low-attenuation lesions in the left kidney are too small to definitively characterize, but are statistically likely to represent tiny cysts. Urinary bladder is normal in appearance. Bilateral adrenal glands are normal in appearance. Stomach/Bowel: The appearance of the stomach is normal. There is no pathologic dilatation of small bowel or colon. Numerous colonic diverticulae are noted, without surrounding inflammatory changes to suggest an acute diverticulitis at this time. Normal appendix. Vascular/Lymphatic: Aortic atherosclerosis, without evidence of aneurysm or dissection in the abdominal or pelvic vasculature. No lymphadenopathy noted in the abdomen or pelvis. Reproductive: Prostate gland seminal vesicles are unremarkable in appearance. Other: Small supraumbilical ventral hernia containing only omental fat. No significant volume of ascites. No pneumoperitoneum.  Musculoskeletal: Median sternotomy wires. There are no aggressive appearing lytic or blastic lesions noted in the visualized portions of the skeleton. IMPRESSION: 1. 11 mm obstructive calculus in the middle third of the right ureter with mild proximal right hydroureteronephrosis. 2. Colonic diverticulosis, without evidence of acute diverticulitis at this time. 3. Normal appendix. 4. Aortic atherosclerosis, in addition to least 3 vessel coronary artery disease. 5. Small supraumbilical ventral hernia containing only omental fat. 6. Additional incidental findings, as above. Electronically Signed  By: Vinnie Langton M.D.   On: 10/19/2016 15:01    Procedures Procedures (including critical care time)  Medications Ordered in ED Medications  sodium chloride 0.9 % bolus 500 mL (500 mLs Intravenous New Bag/Given 10/19/16 1301)  morphine 4 MG/ML injection 2 mg (2 mg Intravenous Given 10/19/16 1301)  iopamidol (ISOVUE-300) 61 % injection (75 mLs  Contrast Given 10/19/16 1414)     Initial Impression / Assessment and Plan / ED Course  I have reviewed the triage vital signs and the nursing notes.  Pertinent labs & imaging results that were available during my care of the patient were reviewed by me and considered in my medical decision making (see chart for details).     Patient given pain medicine here. He is eating and drinking without difficulty. CT reviewed and small ventral hernia containing fat was noted. There is no bowel content and it does not appear incarcerated. Patient referred to general surgery. He should advised regarding need for follow-up and return precautions. Patient also noted to have 11 mm proximal ureteral stone on right. This has abnormal anatomy) inferior vena cava. It is unclear from patient's history of pain is from the kidney stone versus the ventral hernia. Patient is followed by Dr. Karsten Ro For previous kidney stones. There are bacteria and rare leukocytes noted on urinalysis. Plan  Keflex and culture urine. Patient is advised to have close follow-up with Dr. Karsten Ro.Discussed with Dr. Jonny Ruiz on call for urology and agrees with close follow up for ureteral stone.  Final diagnoses:  Epigastric pain  Ventral hernia without obstruction or gangrene  Right ureteral stone  Congenital anomaly of ureter    New Prescriptions New Prescriptions   CEPHALEXIN (KEFLEX) 250 MG CAPSULE    Take 1 capsule (250 mg total) by mouth 4 (four) times daily.     Pattricia Boss, MD 10/19/16 1537    Pattricia Boss, MD 10/19/16 [phone]

## 2016-10-19 NOTE — ED Notes (Signed)
ED Provider at bedside. 

## 2016-10-19 NOTE — Discharge Instructions (Signed)
Call Dr. Ethlyn Gallery office for reevaluation for follow up regarding ventral hernia.  Use abominal binder as needed.  Take antibiotics and follow up with Dr.Ottelin asap.  Return if pain worsens, your are unable to tolerate food or drink, fever, or other worsening symptoms.

## 2016-10-19 NOTE — ED Notes (Signed)
Called CT and asked if kidney fx was ok for scan. They will be here to get pt soon.

## 2016-10-19 NOTE — ED Triage Notes (Signed)
Pt from PCP with abdominal pain that started on Saturday and has got worse as the weekend progressed. PCP states pt has hernia that needs surgery. PCP spoke with a doctor here at [facility] but EMS un sure of which MD was spoken with.

## 2016-10-20 DIAGNOSIS — N132 Hydronephrosis with renal and ureteral calculous obstruction: Secondary | ICD-10-CM | POA: Diagnosis not present

## 2016-10-20 DIAGNOSIS — N281 Cyst of kidney, acquired: Secondary | ICD-10-CM | POA: Diagnosis not present

## 2016-10-20 DIAGNOSIS — N201 Calculus of ureter: Secondary | ICD-10-CM | POA: Diagnosis not present

## 2016-10-20 LAB — URINE CULTURE: Culture: NO GROWTH

## 2016-10-21 ENCOUNTER — Telehealth: Payer: Self-pay | Admitting: Cardiology

## 2016-10-21 ENCOUNTER — Other Ambulatory Visit: Payer: Self-pay | Admitting: Urology

## 2016-10-21 NOTE — Telephone Encounter (Signed)
Patient has an appointment 10-29-16, he will need to be seen for clearance. Will fax this note to the number provided.

## 2016-10-21 NOTE — Telephone Encounter (Signed)
Request for surgical clearance:  1. What type of surgery is being performed? Uretheroscopy and Retrograde Pyelogram, Laser Lithotripsy, Stent Placement  2. When is this surgery scheduled? pending clearance  3. Are there any medications that need to be held prior to surgery and how long?Eliquis for 3day prior to surgery  4. Name of physician performing surgery? Mark Ottelin  5. What is your office phone and fax number? Fax # [phone] Phone# [phone] ext [phone]

## 2016-10-26 NOTE — Progress Notes (Signed)
HPI: Followup coronary artery disease status post coronary artery bypassing graft and atrial fibrillation. Patient had previous coronary artery bypassing graft in 1993. Last cardiac catheterization in 2005 showed occluded LAD, circumflex and right coronary artery. LIMA to the LAD was patent, saphenous vein graft to obtuse marginal patent. The saphenous vein graft to the PDA had a severe stenosis but there were collaterals from the obtuse marginal and PLA. Medical therapy recommended. Echocardiogram in March of 2012 showed normal LV function, mild to moderate left atrial enlargement, mild right atrial enlargement, trace aortic and mitral regurgitation. Nuclear study 7/17 showed EF 59 and no ischemia. Since he was last seen, the patient has dyspnea with more extreme activities but not with routine activities. It is relieved with rest. It is not associated with chest pain. There is no orthopnea, PND or pedal edema. There is no syncope or palpitations. There is no exertional chest pain.   Current Outpatient Prescriptions  Medication Sig Dispense Refill  . amiodarone (PACERONE) 200 MG tablet TAKE ONE TABLET BY MOUTH DAILY 30 tablet 9  . amLODipine (NORVASC) 5 MG tablet TAKE ONE TABLET BY MOUTH EVERY DAY 90 tablet 0  . calcium carbonate (TUMS - DOSED IN MG ELEMENTAL CALCIUM) 500 MG chewable tablet Chew 2 tablets by mouth daily.    . cholecalciferol (VITAMIN D) 1000 units tablet Take 1,000 Units by mouth daily.    . clobetasol cream (TEMOVATE) 6.06 % Apply 1 application topically 2 (two) times daily.     Marland Kitchen ELIQUIS 5 MG TABS tablet TAKE ONE TABLET BY MOUTH TWICE DAILY 60 tablet 5  . ketoconazole (NIZORAL) 2 % cream Apply 1 application topically daily as needed for irritation.     . Methylcellulose, Laxative, 500 MG TABS Take 2 tablets by mouth 2 (two) times daily.     Marland Kitchen NITROSTAT 0.4 MG SL tablet PLACE 1 TABLET UNDER THE TONGUE EVERY 5 MINUTES IF NEEDED FOR CHEST PAIN. MAX = 3 TABS/EPISODE. 25 tablet  0  . rosuvastatin (CRESTOR) 20 MG tablet Take 20 mg by mouth daily.      No current facility-administered medications for this visit.      Past Medical History:  Diagnosis Date  . Adenomatous polyp   . Arthritis    "left shoulder" (03/02/2013)  . Atrial fibrillation ([place])   . Bladder cancer ([place]) 07/31/2011  . CAD (coronary artery disease)    a. remote CABG b. prior cardiac cath 2005 c. Lexiscan Myoview 07/08/12 w/o evidence of ischemia, EF 53%  . Diverticulosis   . Elevated PSA   . Frequent urination    "day and night" (03/02/2013)  . GERD (gastroesophageal reflux disease)   . Gout    "been a long time ago" (03/02/2013)  . HLD (hyperlipidemia)   . HTN (hypertension)   . Kidney stones    "passed all except one; they had to go get that one" (03/02/2013)  . Myocardial infarction [facility]) 1990's   silent    Past Surgical History:  Procedure Laterality Date  . CARDIAC CATHETERIZATION     total occlusion to LAD, LCX, RCA. LIMA patent, normal seq SVG to OM, severe stenosis in SVG to PDA, but collaterals to PLA from OM. RCA went to PDA. treated medically   . CARDIOVASCULAR STRESS TEST  02/10/08   6 mets, poor tolerance, no chest pain   . CATARACT EXTRACTION W/ INTRAOCULAR LENS IMPLANT Right 2014  . CORONARY ARTERY BYPASS GRAFT  08/1991   "CABG X4" (03/02/2013)  .  CYSTOSCOPY W/ STONE MANIPULATION    . ORIF ACETABULAR FRACTURE Left 04/2007   trimalleolar fracture  . TOTAL SHOULDER ARTHROPLASTY Left 03/02/2013  . TOTAL SHOULDER ARTHROPLASTY Left 03/02/2013   Procedure: LEFT TOTAL SHOULDER ARTHROPLASTY;  Surgeon: Marin Shutter, MD;  Location: [place];  Service: Orthopedics;  Laterality: Left;  . TRANSURETHRAL RESECTION OF BLADDER TUMOR  07/31/2011   Procedure: TRANSURETHRAL RESECTION OF BLADDER TUMOR (TURBT);  Surgeon: Claybon Jabs, MD;  Location: [facility];  Service: Urology;  Laterality: N/A;  with [place]    Social History   Social History  . Marital status: Married    Spouse name: N/A  .  Number of children: 3  . Years of education: N/A   Occupational History  . retired Psychologist, sport and exercise    Social History Main Topics  . Smoking status: Never Smoker  . Smokeless tobacco: Never Used  . Alcohol use No  . Drug use: No  . Sexual activity: No   Other Topics Concern  . Not on file   Social History Narrative   Lives at home with wife.      Family History  Problem Relation Age of Onset  . CAD Father 70  . CAD Brother 56  . CAD Brother 38    ROS: no fevers or chills, productive cough, hemoptysis, dysphasia, odynophagia, melena, hematochezia, dysuria, hematuria, rash, seizure activity, orthopnea, PND, pedal edema, claudication. Remaining systems are negative.  Physical Exam: Well-developed well-nourished in no acute distress.  Skin is warm and dry.  HEENT is normal.  Neck is supple. No bruits Chest is clear to auscultation with normal expansion. No wheeze  Cardiovascular exam is regular rate and rhythm. No murmurs Abdominal exam nontender or distended. No masses palpated. Extremities show no edema. neuro grossly intact  ECG- Sinus bradycardia at a rate of 53. Right bundle branch block. Cannot rule out prior inferior infarct. personally reviewed  A/P  1 Coronary artery disease-patient is doing well with no chest pain. Continue statin. He is not on aspirin given need for anticoagulation. Last nuclear study showed no ischemia.   2 paroxysmal atrial fibrillation-patient remains in sinus rhythm. Continue amiodarone. Continue apixaban. Check chest x-ray. Recent liver functions normal. Check TSH. Recent hemoglobin 13.9. Recent creatinine 1.52. Will repeat.   3 hypertension-blood pressure elevated. He states typically controlled. Continue present medications and follow.  4 hyperlipidemia-continue statin.  5 preoperative evaluation prior to removal of kidney stone -Patient has not had chest pain. He may proceed with his surgery without further preoperative evaluation. Hold  apixaban 3 days prior to procedure and resume after when okay with urology.  [name], MD

## 2016-10-29 ENCOUNTER — Ambulatory Visit (HOSPITAL_COMMUNITY)
Admission: RE | Admit: 2016-10-29 | Discharge: 2016-10-29 | Disposition: A | Discharge status: Home / Self Care | Payer: Medicare Other | Source: Ambulatory Visit | Attending: Cardiology | Admitting: Cardiology

## 2016-10-29 ENCOUNTER — Encounter (HOSPITAL_BASED_OUTPATIENT_CLINIC_OR_DEPARTMENT_OTHER): Payer: Self-pay | Admitting: *Deleted

## 2016-10-29 ENCOUNTER — Encounter: Payer: Self-pay | Admitting: Cardiology

## 2016-10-29 ENCOUNTER — Ambulatory Visit (INDEPENDENT_AMBULATORY_CARE_PROVIDER_SITE_OTHER): Payer: Medicare Other | Admitting: Cardiology

## 2016-10-29 VITALS — BP 153/82 | HR 53 | Ht 71.0 in | Wt 233.4 lb

## 2016-10-29 DIAGNOSIS — Z7901 Long term (current) use of anticoagulants: Secondary | ICD-10-CM | POA: Diagnosis not present

## 2016-10-29 DIAGNOSIS — I2510 Atherosclerotic heart disease of native coronary artery without angina pectoris: Secondary | ICD-10-CM | POA: Diagnosis not present

## 2016-10-29 DIAGNOSIS — I480 Paroxysmal atrial fibrillation: Secondary | ICD-10-CM

## 2016-10-29 DIAGNOSIS — I10 Essential (primary) hypertension: Secondary | ICD-10-CM | POA: Diagnosis not present

## 2016-10-29 DIAGNOSIS — I700 Atherosclerosis of aorta: Secondary | ICD-10-CM | POA: Insufficient documentation

## 2016-10-29 DIAGNOSIS — E7800 Pure hypercholesterolemia, unspecified: Secondary | ICD-10-CM

## 2016-10-29 DIAGNOSIS — I4891 Unspecified atrial fibrillation: Secondary | ICD-10-CM | POA: Diagnosis not present

## 2016-10-29 LAB — BASIC METABOLIC PANEL
BUN: 15 mg/dL (ref 7–25)
CO2: 23 mmol/L (ref 20–31)
CREATININE: 1.53 mg/dL — AB (ref 0.70–1.11)
Calcium: 9.3 mg/dL (ref 8.6–10.3)
Chloride: 106 mmol/L (ref 98–110)
Glucose, Bld: 97 mg/dL (ref 65–99)
Potassium: 5 mmol/L (ref 3.5–5.3)
Sodium: 143 mmol/L (ref 135–146)

## 2016-10-29 LAB — TSH: TSH: 3.7 mIU/L (ref 0.40–4.50)

## 2016-10-29 NOTE — Patient Instructions (Signed)
Medication Instructions:   NO CHANGE  HOLD ELIQUIS 3 DAYS PRIOR TO PROCEDURE  RESTART WHEN OKAY WITH SURGEON  Labwork:  Your physician recommends that you HAVE LAB WORK TODAY  Testing/Procedures:  A chest x-ray takes a picture of the organs and structures inside the chest, including the heart, lungs, and blood vessels. This test can show several things, including, whether the heart is enlarges; whether fluid is building up in the lungs; and whether pacemaker / defibrillator leads are still in place. AT [facility]  Follow-Up:  Your physician wants you to follow-up in: [place] will receive a reminder letter in the mail two months in advance. If you don't receive a letter, please call our office to schedule the follow-up appointment.   If you need a refill on your cardiac medications before your next appointment, please call your pharmacy.

## 2016-10-29 NOTE — Progress Notes (Signed)
Spoke with wife-To [place]  at [phone] on arrival-Labs,Ekg,office clearance note with chart.Instructed Npo after Mn-will take amlodipine with small amt water.

## 2016-10-30 ENCOUNTER — Telehealth: Payer: Self-pay | Admitting: *Deleted

## 2016-10-30 MED ORDER — APIXABAN 2.5 MG PO TABS: 2.5000 mg | ORAL_TABLET | Freq: Two times a day (BID) | ORAL | Status: DC

## 2016-10-30 NOTE — Telephone Encounter (Addendum)
-----   Message from Lelon Perla, MD sent at 10/30/2016  7:24 AM EDT ----- Change apixaban to 2.5 mg BID based on elevated Cr. [place] with pt, he voiced understanding of medication change.

## 2016-11-05 NOTE — H&P (Signed)
HPI: Evan Dorsey is a 83 year-old male established patient with a right ureteral calculus.  The patient's stone is one his right side. He first noticed the symptoms 10/16/2016. This is his first kidney stone. There is not a history of calculus disease in the family. He is currently having flank pain. He denies having back pain, groin pain, nausea, vomiting, fever, and chills. He does not have a burning sensation when he urinates. He has not caught a stone in his urine strainer since his symptoms began.   He has never had surgical treatment for calculi in the past.   10/20/16:4 days ago he began having right flank pain with some radiation into the abdomen. It occurred for 48 hours intermittently and then resolved. He also was having some discomfort in his abdomen where he has a known abdominal wall hernia. He was therefore seen in the emergency room yesterday.  A CT scan done on 10/19/16 revealed bilateral renal cysts, right hydronephrosis and a 9 mm stone located in the right ureter which courses behind the vena cava proximal to where the stone is located. His stone had Hounsfield units of 1100. There was some associated hydronephrosis.  He was also told that he had a bladder infection and was started on cephalexin. He was not having any voiding symptoms to suggest infection.     ALLERGIES: No Allergies    MEDICATIONS: Myrbetriq 25 mg tablet, extended release 24 hr 1 tablet PO Daily  [name]  Amiodarone Hcl  Amlodipine Besylate  Clobetasol Propionate 0.05 % cream  Ketoconazole 2 % cream  Methylcellulose  Nitroglycerin  Rosuvastatin Calcium  Tylenol  Vitamin D     GU PSH: Cystoscopy - 02/04/2016 Cystoscopy TURBT <2 cm - 2013 Ureteroscopic stone removal - 2012      PSH Notes: Cystoscopy With Fulguration Small Lesion (5-35mm), Cystoscopy With Ureteroscopy With Removal Of Calculus, Heart Surgery, Ankle Surgery   NON-GU PSH: None   GU PMH: History of bladder cancer (Stable), He had no  evidence of recurrent transitional cell carcinoma of the bladder today. I will continue yearly surveillance. - 02/04/2016, History of bladder cancer, - 07/30/2015 Prostate Cancer (Stable), His prostate remains benign and his PSA is lower than it has been in 2 years. I will continue active surveillance. - 02/04/2016, Adenocarcinoma of prostate, - 07/30/2015 Urinary Urgency, He has developed urinary urgency. I will culture his urine and I gave him samples of Myrbetriq to try as well in case culture is negative. - 02/04/2016 Urinary Tract Inf, Unspec site, Pyuria - 07/30/2015 Male ED, unspecified, Erectile dysfunction - 2015 BPH w/LUTS, Benign prostatic hyperplasia with urinary obstruction - 2014 Other microscopic hematuria, Microscopic hematuria - 2014 Renal calculus, Kidney stone on right side - 2014 Renal cyst, Renal cysts, acquired, bilateral - 2014 Ureteral calculus, Distal Ureteral Stone On The Right - 2014      PMH Notes: Calculus disease: He had a right distal ureteral stone treated with ureteroscopy and extraction in 11/08/97. He has passed stones spontaneously as well.   BPH with outlet obstruction: This is been present for number of years. He has not had significant progression of obstructive voiding symptoms.   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   Adenocarcinoma of the prostate: He was noted to have a PSA of 6.38 in 5/12. PSA done by Dr. Shelia Media on 11/25/11 was 10.9 but in 4/14 was back down to 7.93. He appears to have some variation in his PSA  values. No abnormality was noted on DRE.  TRUS/BX 07/18/13: His prostate volume was 52 cc with no significant abnormality noted.  Pathology: Adenocarcinoma Gleason 3+3 = 6 in 5 cores, 3+4 = 7 in 1 core and 4+3 = 7 in 1 core for a total of 7/12 cores positive.  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     NON-GU PMH: Bacteriuria,  Asymptomatic bacteriuria - 08/02/2015 Encounter for general adult medical examination without abnormal findings, Encounter for preventive health examination - 2015 Gout, Gout - 2014 Personal history of other endocrine, nutritional and metabolic disease, History of hypercholesterolemia - 2014 Unspecified atrial fibrillation, Atrial Fibrillation - 2014    FAMILY HISTORY: Acute Myocardial Infarction - Runs In Family Death In The Family Father - Runs In Family Death In The Family Mother - [place] is 1 daughter and 1 - Runs In Family No pertinent family history - Other   SOCIAL HISTORY: Marital Status: Married Current Smoking Status: Patient has never smoked.  Has never drank.  Does not use drugs. Does not drink caffeine.     Notes: Caffeine Use, Alcohol Use, Never A Smoker, Retired From Work   REVIEW OF SYSTEMS:    GU Review Male:   Patient denies frequent urination, hard to postpone urination, burning/ pain with urination, get up at night to urinate, leakage of urine, stream starts and stops, trouble starting your stream, have to strain to urinate , erection problems, and penile pain.  Gastrointestinal (Upper):   Patient denies nausea, vomiting, and indigestion/ heartburn.  Gastrointestinal (Lower):   Patient denies diarrhea and constipation.  Constitutional:   Patient denies fever, night sweats, weight loss, and fatigue.  Skin:   Patient denies skin rash/ lesion and itching.  Eyes:   Patient denies blurred vision and double vision.  Ears/ Nose/ Throat:   Patient denies sore throat and sinus problems.  Hematologic/Lymphatic:   Patient denies swollen glands and easy bruising.  Cardiovascular:   Patient denies leg swelling and chest pains.  Respiratory:   Patient denies cough and shortness of breath.  Endocrine:   Patient denies excessive thirst.  Musculoskeletal:   Patient denies back pain and joint pain.  Neurological:   Patient denies headaches and  dizziness.  Psychologic:   Patient denies depression and anxiety.   VITAL SIGNS:    Weight 232 lb / 105.23 kg  Height 69 in / 175.26 cm  BP 136/79 mmHg  Pulse 59 /min  BMI 34.3 kg/m   Physical Exam  Constitutional: Well nourished and well developed . No acute distress.   ENT:. The ears and nose are normal in appearance.   Neck: The appearance of the neck is normal and no neck mass is present.   Pulmonary: No respiratory distress and normal respiratory rhythm and effort.   Cardiovascular: Heart rate and rhythm are normal . No peripheral edema.   Abdomen: The abdomen is mildly obese. The abdomen is soft and nontender. No masses are palpated. No CVA tenderness. No hernias are palpable. No hepatosplenomegaly noted.   Rectal: Rectal exam demonstrates normal sphincter tone, no tenderness and no masses. Estimated prostate size is 1+. The prostate has no nodularity and is not tender. The left seminal vesicle is nonpalpable. The right seminal vesicle is nonpalpable. The perineum is normal on inspection.   Genitourinary: Examination of the penis demonstrates no discharge, no masses, no lesions and a normal meatus. The penis is  circumcised. The scrotum is without lesions. The right epididymis is palpably normal and non-tender. The left epididymis is palpably normal and non-tender. The right testis is non-tender and without masses. The left testis is non-tender and without masses.   Lymphatics: The femoral and inguinal nodes are not enlarged or tender.   Skin: Normal skin turgor, no visible rash and no visible skin lesions.   Neuro/Psych:. Mood and affect are appropriate.    PAST DATA REVIEWED:  Source Of History:  Patient  Lab Test Review:   BUN/Creatinine  Records Review:   Previous Patient Records, POC Tool  X-Ray Review: C.T. Stone Protocol: Reviewed Films. Reviewed Report. Discussed With Patient.     07/29/16 01/24/16 07/24/15 02/06/15 08/07/14 01/15/14 06/19/13 09/14/12  PSA   Total PSA 10.50 ng/dl 10.30 ng/dl 12.28  14.05  11.51  11.38  8.78  7.93   Free PSA       1.37  1.15   % Free PSA       16  15    Notes:                     His creatinine in 3/15 was normal at 1.1.   PROCEDURES: None   ASSESSMENT/PLAN:       ICD-10 Details  1 GU:   Ureteral calculus - N20.1 Right, Acute - We discussed the management of urinary stones. These options include observation, ureteroscopy, shockwave lithotripsy, and PCNL. We discussed which options are relevant to these particular stones. We discussed the natural history of stones as well as the complications of untreated stones and the impact on quality of life without treatment as well as with each of the above listed treatments. We also discussed the efficacy of each treatment in its ability to clear the stone burden. With any of these management options I discussed the signs and symptoms of infection and the need for emergent treatment should these be experienced. For each option we discussed the ability of each procedure to clear the patient of their stone burden. For observation I described the risks which include but are not limited to silent renal damage, life-threatening infection, need for emergent surgery, failure to pass stone, and pain. For ureteroscopy I described the risks which include heart attack, stroke, pulmonary embolus, death, bleeding, infection, damage to contiguous structures, positioning injury, ureteral stricture, ureteral avulsion, ureteral injury, need for ureteral stent, inability to perform ureteroscopy, need for an interval procedure, inability to clear stone burden, stent discomfort and pain. For shockwave lithotripsy I described the risks which include arrhythmia, kidney contusion, kidney hemorrhage, need for transfusion, long-term risk of diabetes or hypertension, back discomfort, flank ecchymosis, flank abrasion, inability to break up stone, inability to pass stone fragments, Steinstrasse, infection  associated with obstructing stones, need for different surgical procedure and possible need for repeat shockwave lithotripsy.   2   Ureteral obstruction secondary to calculous - N13.2 Right, Acute - He does have some hydronephrosis proximal to the stone.  3   Renal cyst - N28.1 Bilateral, Stable - His recent. There is a Bilateral renal cysts were visualized on his most recent CT scan and appears simple.              Notes:   He has elected to proceed with ureteroscopic management of his stone. He takes Eliquis which she will stop 3 days prior to the procedure.  I told him he could stop his Keflex.

## 2016-11-06 ENCOUNTER — Ambulatory Visit (HOSPITAL_BASED_OUTPATIENT_CLINIC_OR_DEPARTMENT_OTHER): Payer: Medicare Other | Admitting: Anesthesiology

## 2016-11-06 ENCOUNTER — Encounter (HOSPITAL_BASED_OUTPATIENT_CLINIC_OR_DEPARTMENT_OTHER)
Admission: RE | Disposition: A | Discharge status: Home / Self Care | Payer: Self-pay | Source: Ambulatory Visit | Attending: Urology

## 2016-11-06 ENCOUNTER — Ambulatory Visit (HOSPITAL_COMMUNITY): Payer: Medicare Other

## 2016-11-06 ENCOUNTER — Ambulatory Visit (HOSPITAL_BASED_OUTPATIENT_CLINIC_OR_DEPARTMENT_OTHER)
Admission: RE | Admit: 2016-11-06 | Discharge: 2016-11-06 | Disposition: A | Discharge status: Home / Self Care | Payer: Medicare Other | Source: Ambulatory Visit | Attending: Urology | Admitting: Urology

## 2016-11-06 ENCOUNTER — Encounter (HOSPITAL_BASED_OUTPATIENT_CLINIC_OR_DEPARTMENT_OTHER): Payer: Self-pay | Admitting: *Deleted

## 2016-11-06 DIAGNOSIS — Z79899 Other long term (current) drug therapy: Secondary | ICD-10-CM | POA: Insufficient documentation

## 2016-11-06 DIAGNOSIS — Z951 Presence of aortocoronary bypass graft: Secondary | ICD-10-CM | POA: Diagnosis not present

## 2016-11-06 DIAGNOSIS — I252 Old myocardial infarction: Secondary | ICD-10-CM | POA: Diagnosis not present

## 2016-11-06 DIAGNOSIS — N132 Hydronephrosis with renal and ureteral calculous obstruction: Secondary | ICD-10-CM | POA: Diagnosis not present

## 2016-11-06 DIAGNOSIS — K439 Ventral hernia without obstruction or gangrene: Secondary | ICD-10-CM | POA: Insufficient documentation

## 2016-11-06 DIAGNOSIS — Z8546 Personal history of malignant neoplasm of prostate: Secondary | ICD-10-CM | POA: Insufficient documentation

## 2016-11-06 DIAGNOSIS — Z466 Encounter for fitting and adjustment of urinary device: Secondary | ICD-10-CM | POA: Diagnosis not present

## 2016-11-06 DIAGNOSIS — I480 Paroxysmal atrial fibrillation: Secondary | ICD-10-CM | POA: Diagnosis not present

## 2016-11-06 DIAGNOSIS — I2510 Atherosclerotic heart disease of native coronary artery without angina pectoris: Secondary | ICD-10-CM | POA: Insufficient documentation

## 2016-11-06 DIAGNOSIS — R109 Unspecified abdominal pain: Secondary | ICD-10-CM | POA: Diagnosis not present

## 2016-11-06 DIAGNOSIS — Z7901 Long term (current) use of anticoagulants: Secondary | ICD-10-CM | POA: Insufficient documentation

## 2016-11-06 DIAGNOSIS — N281 Cyst of kidney, acquired: Secondary | ICD-10-CM | POA: Diagnosis not present

## 2016-11-06 DIAGNOSIS — I10 Essential (primary) hypertension: Secondary | ICD-10-CM | POA: Diagnosis not present

## 2016-11-06 DIAGNOSIS — N201 Calculus of ureter: Secondary | ICD-10-CM

## 2016-11-06 HISTORY — PX: URETEROSCOPY WITH HOLMIUM LASER LITHOTRIPSY: SHX6645

## 2016-11-06 SURGERY — URETEROSCOPY, WITH LITHOTRIPSY USING HOLMIUM LASER
Anesthesia: General | Site: Ureter | Laterality: Right

## 2016-11-06 MED ORDER — FENTANYL CITRATE (PF) 100 MCG/2ML IJ SOLN
INTRAMUSCULAR | Status: DC | PRN
  Administered 2016-11-06 (×2): 50 ug via INTRAVENOUS

## 2016-11-06 MED ORDER — PHENAZOPYRIDINE HCL 100 MG PO TABS
ORAL_TABLET | ORAL | Status: AC
  Filled 2016-11-06: qty 1

## 2016-11-06 MED ORDER — LACTATED RINGERS IV SOLN
INTRAVENOUS | Status: DC
  Administered 2016-11-06: 09:00:00 via INTRAVENOUS
  Filled 2016-11-06: qty 1000

## 2016-11-06 MED ORDER — MIDAZOLAM HCL 5 MG/5ML IJ SOLN
INTRAMUSCULAR | Status: DC | PRN
  Administered 2016-11-06: 2 mg via INTRAVENOUS

## 2016-11-06 MED ORDER — OXYCODONE HCL 5 MG/5ML PO SOLN
5.0000 mg | Freq: Once | ORAL | Status: AC | PRN
  Filled 2016-11-06: qty 5

## 2016-11-06 MED ORDER — ONDANSETRON HCL 4 MG/2ML IJ SOLN
INTRAMUSCULAR | Status: DC | PRN
  Administered 2016-11-06: 4 mg via INTRAVENOUS

## 2016-11-06 MED ORDER — OXYCODONE HCL 5 MG PO TABS
ORAL_TABLET | ORAL | Status: AC
  Filled 2016-11-06: qty 1

## 2016-11-06 MED ORDER — DEXAMETHASONE SODIUM PHOSPHATE 4 MG/ML IJ SOLN
INTRAMUSCULAR | Status: DC | PRN
  Administered 2016-11-06: 10 mg via INTRAVENOUS

## 2016-11-06 MED ORDER — FENTANYL CITRATE (PF) 100 MCG/2ML IJ SOLN
INTRAMUSCULAR | Status: AC
  Filled 2016-11-06: qty 2

## 2016-11-06 MED ORDER — IOHEXOL 300 MG/ML  SOLN
INTRAMUSCULAR | Status: DC | PRN
  Administered 2016-11-06: 18 mL via URETHRAL

## 2016-11-06 MED ORDER — CIPROFLOXACIN IN D5W 400 MG/200ML IV SOLN
400.0000 mg | INTRAVENOUS | Status: AC
  Administered 2016-11-06: 400 mg via INTRAVENOUS
  Filled 2016-11-06: qty 200

## 2016-11-06 MED ORDER — ONDANSETRON HCL 4 MG/2ML IJ SOLN
4.0000 mg | Freq: Four times a day (QID) | INTRAMUSCULAR | Status: DC | PRN
  Filled 2016-11-06: qty 2

## 2016-11-06 MED ORDER — SODIUM CHLORIDE 0.9 % IR SOLN
Status: DC | PRN
  Administered 2016-11-06: 1000 mL
  Administered 2016-11-06: 3000 mL

## 2016-11-06 MED ORDER — PHENYLEPHRINE 40 MCG/ML (10ML) SYRINGE FOR IV PUSH (FOR BLOOD PRESSURE SUPPORT)
PREFILLED_SYRINGE | INTRAVENOUS | Status: AC
  Filled 2016-11-06: qty 10

## 2016-11-06 MED ORDER — OXYCODONE HCL 5 MG PO TABS
5.0000 mg | ORAL_TABLET | Freq: Once | ORAL | Status: AC | PRN
  Administered 2016-11-06: 5 mg via ORAL
  Filled 2016-11-06: qty 1

## 2016-11-06 MED ORDER — ONDANSETRON HCL 4 MG/2ML IJ SOLN
INTRAMUSCULAR | Status: AC
  Filled 2016-11-06: qty 2

## 2016-11-06 MED ORDER — FENTANYL CITRATE (PF) 100 MCG/2ML IJ SOLN
25.0000 ug | INTRAMUSCULAR | Status: DC | PRN
  Filled 2016-11-06: qty 1

## 2016-11-06 MED ORDER — HYDROCODONE-ACETAMINOPHEN 10-325 MG PO TABS: 1.0000 | ORAL_TABLET | ORAL | 0 refills | Status: DC | PRN

## 2016-11-06 MED ORDER — LIDOCAINE 2% (20 MG/ML) 5 ML SYRINGE
INTRAMUSCULAR | Status: DC | PRN
  Administered 2016-11-06: 100 mg via INTRAVENOUS

## 2016-11-06 MED ORDER — DEXAMETHASONE SODIUM PHOSPHATE 10 MG/ML IJ SOLN
INTRAMUSCULAR | Status: AC
  Filled 2016-11-06: qty 1

## 2016-11-06 MED ORDER — PROPOFOL 10 MG/ML IV BOLUS
INTRAVENOUS | Status: AC
  Filled 2016-11-06: qty 20

## 2016-11-06 MED ORDER — PHENYLEPHRINE 40 MCG/ML (10ML) SYRINGE FOR IV PUSH (FOR BLOOD PRESSURE SUPPORT)
PREFILLED_SYRINGE | INTRAVENOUS | Status: DC | PRN
  Administered 2016-11-06: 80 ug via INTRAVENOUS
  Administered 2016-11-06: 40 ug via INTRAVENOUS

## 2016-11-06 MED ORDER — LIDOCAINE 2% (20 MG/ML) 5 ML SYRINGE
INTRAMUSCULAR | Status: AC
  Filled 2016-11-06: qty 5

## 2016-11-06 MED ORDER — PHENAZOPYRIDINE HCL 100 MG PO TABS
100.0000 mg | ORAL_TABLET | Freq: Once | ORAL | Status: AC
  Administered 2016-11-06: 100 mg via ORAL
  Filled 2016-11-06: qty 1

## 2016-11-06 MED ORDER — PROPOFOL 10 MG/ML IV BOLUS
INTRAVENOUS | Status: DC | PRN
  Administered 2016-11-06: 120 mg via INTRAVENOUS

## 2016-11-06 MED ORDER — PHENAZOPYRIDINE HCL 200 MG PO TABS: 200.0000 mg | ORAL_TABLET | Freq: Three times a day (TID) | ORAL | 0 refills | Status: DC | PRN

## 2016-11-06 MED ORDER — CIPROFLOXACIN IN D5W 400 MG/200ML IV SOLN
INTRAVENOUS | Status: AC
  Filled 2016-11-06: qty 200

## 2016-11-06 SURGICAL SUPPLY — 42 items
BAG DRAIN URO-CYSTO SKYTR STRL (DRAIN) ×2 IMPLANT
BAG DRN UROCATH (DRAIN) ×1
BASKET DAKOTA 1.9FR 11X120 (BASKET) ×1 IMPLANT
BASKET LASER NITINOL 1.9FR (BASKET) IMPLANT
BASKET STNLS GEMINI 4WIRE 3FR (BASKET) IMPLANT
BASKET ZERO TIP NITINOL 2.4FR (BASKET) IMPLANT
BSKT STON RTRVL 120 1.9FR (BASKET)
BSKT STON RTRVL GEM 120X11 3FR (BASKET)
BSKT STON RTRVL ZERO TP 2.4FR (BASKET)
CATH INTERMIT  6FR 70CM (CATHETERS) ×1 IMPLANT
CATH URET 5FR 28IN CONE TIP (BALLOONS)
CATH URET 5FR 70CM CONE TIP (BALLOONS) IMPLANT
CLOTH BEACON ORANGE TIMEOUT ST (SAFETY) ×2 IMPLANT
ELECT REM PT RETURN 9FT ADLT (ELECTROSURGICAL)
ELECTRODE REM PT RTRN 9FT ADLT (ELECTROSURGICAL) IMPLANT
FIBER LASER FLEXIVA 200 (UROLOGICAL SUPPLIES) ×1 IMPLANT
FIBER LASER FLEXIVA 365 (UROLOGICAL SUPPLIES) IMPLANT
FIBER LASER FLEXIVA 550 (UROLOGICAL SUPPLIES) IMPLANT
FIBER LASER TRAC TIP (UROLOGICAL SUPPLIES) IMPLANT
GLOVE BIO SURGEON STRL SZ 6.5 (GLOVE) ×1 IMPLANT
GLOVE BIO SURGEON STRL SZ8 (GLOVE) ×2 IMPLANT
GLOVE BIOGEL PI IND STRL 6.5 (GLOVE) IMPLANT
GLOVE BIOGEL PI INDICATOR 6.5 (GLOVE) ×2
GOWN STRL REUS W/ TWL LRG LVL3 (GOWN DISPOSABLE) ×1 IMPLANT
GOWN STRL REUS W/ TWL XL LVL3 (GOWN DISPOSABLE) ×1 IMPLANT
GOWN STRL REUS W/TWL LRG LVL3 (GOWN DISPOSABLE) ×2
GOWN STRL REUS W/TWL XL LVL3 (GOWN DISPOSABLE) ×2
GUIDEWIRE 0.038 PTFE COATED (WIRE) IMPLANT
GUIDEWIRE ANG ZIPWIRE 038X150 (WIRE) IMPLANT
GUIDEWIRE STR DUAL SENSOR (WIRE) ×2 IMPLANT
IV NS IRRIG 3000ML ARTHROMATIC (IV SOLUTION) ×3 IMPLANT
KIT BALLIN UROMAX 15FX10 (LABEL) IMPLANT
KIT BALLN UROMAX 15FX4 (MISCELLANEOUS) IMPLANT
KIT BALLN UROMAX 26 75X4 (MISCELLANEOUS)
KIT RM TURNOVER CYSTO AR (KITS) ×2 IMPLANT
MANIFOLD NEPTUNE II (INSTRUMENTS) ×1 IMPLANT
PACK CYSTO (CUSTOM PROCEDURE TRAY) ×2 IMPLANT
SET HIGH PRES BAL DIL (LABEL)
SHEATH ACCESS URETERAL 38CM (SHEATH) IMPLANT
STENT URET 6FRX26 CONTOUR (STENTS) ×1 IMPLANT
TUBE CONNECTING 12X1/4 (SUCTIONS) ×1 IMPLANT
WATER STERILE IRR 3000ML UROMA (IV SOLUTION) IMPLANT

## 2016-11-06 NOTE — Op Note (Signed)
PATIENT:  Evan Dorsey  PRE-OPERATIVE DIAGNOSIS:  right Ureteral calculus  POST-OPERATIVE DIAGNOSIS: Same  PROCEDURE:  1. Cystoscopy with right retrograde pyelogram including interpretation. 2.  Right ureteroscopy, laser lithotripsy, stone extraction and double-J stent placement 3. Fluoroscopy time less than 1 hour.  SURGEON: Claybon Jabs, MD  INDICATION: Mr. Gumz is an 83 year old male with a large right ureteral stone. He has had intermittent pain. He has not seen a stone pass. His preoperative KUB did not reveal the stone however it is large enough that I don't think it is passed and have recommended we proceed with the planned procedure.  ANESTHESIA:  General  EBL:  Minimal  DRAINS: 6 French, 26 cm double-J stent in the right ureter (with string)   DESCRIPTION OF PROCEDURE: The patient was taken to the major OR and placed on the table. General anesthesia was administered and then the patient was moved to the dorsal lithotomy position. The genitalia was sterilely prepped and draped. An official timeout was performed.  Initially the 56 French cystoscope with 30 lens was passed under direct vision into the bladder. The bladder was then fully inspected. It was noted be free of any tumors, stones or inflammatory lesions. Ureteral orifices were of normal configuration and position. A 6 French open-ended ureteral catheter was then passed through the cystoscope into the ureteral orifice in order to perform a right retrograde pyelogram.  A retrograde pyelogram was performed by injecting full-strength contrast up the right ureter under direct fluoroscopic control. It revealed a filling defect in the mid to distal ureter consistent with the stone seen on the previous CT scan however it had progressed down the ureter. The remainder of the ureter was noted to be normal as was the intrarenal collecting system. 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 I then passed a 0.038 inch floppy-tipped guidewire through the open ended catheter and into the area of the renal pelvis and this was left in place.  I then proceeded with ureteroscopy.  A 6 French rigid needle tip ureteroscope was then passed under direct into the bladder and into the right orifice and up the ureter. The stone was identified and I felt it was too large to extract and therefore elected to proceed with laser lithotripsy. The 200  holmium laser fiber was used to fragment the stone. I then used the [place] basket to extract all of the stone fragments and reinspection of the ureter ureteroscopically revealed no further stone fragments and no injury to the ureter. I then backloaded the cystoscope over the guidewire and passed the stent over the guidewire into the area of the renal pelvis. As the guidewire was removed good curl was noted in the renal pelvis. The bladder was drained and the cystoscope was then removed. The patient tolerated the procedure well no intraoperative complications.  PLAN OF CARE: Discharge to home after PACU  PATIENT DISPOSITION:  PACU - hemodynamically stable.

## 2016-11-06 NOTE — Anesthesia Procedure Notes (Signed)
Procedure Name: LMA Insertion Date/Time: 11/06/2016 9:44 AM Performed by: Marcie Bal, ADAM Pre-anesthesia Checklist: Patient identified, Emergency Drugs available, Suction available and Patient being monitored Patient Re-evaluated:Patient Re-evaluated prior to inductionOxygen Delivery Method: Circle system utilized Preoxygenation: Pre-oxygenation with 100% oxygen Intubation Type: IV induction Ventilation: Mask ventilation without difficulty LMA: LMA inserted LMA Size: 5.0 Number of attempts: 1 Airway Equipment and Method: Bite block Placement Confirmation: positive ETCO2 Tube secured with: Tape Dental Injury: Teeth and Oropharynx as per pre-operative assessment

## 2016-11-06 NOTE — Progress Notes (Signed)
RN spoke with Dr Karsten Ro about when patient could resume Eliquis. MD stated he could resume Eliquis in 48 hours. In 48 hours if urine was still red/bloodly the patient needs to call the office for further instruction.

## 2016-11-06 NOTE — Anesthesia Postprocedure Evaluation (Signed)
Anesthesia Post Note  Patient: Evan Dorsey  Procedure(s) Performed: Procedure(s) (LRB): URETEROSCOPY WITH HOLMIUM LASER LITHOTRIPSY/ RETROGRADE PYELOGRAM/ STENT (Right)  Patient location during evaluation: PACU Anesthesia Type: General Level of consciousness: awake and alert and patient cooperative Pain management: pain level controlled Vital Signs Assessment: post-procedure vital signs reviewed and stable Respiratory status: spontaneous breathing and respiratory function stable Cardiovascular status: stable Anesthetic complications: no       Last Vitals:  Vitals:   11/06/16 1100 11/06/16 1224  BP: (!) 149/74 (!) 143/57  Pulse: (!) 52 (!) 56  Resp: 13 18  Temp:  36.5 C    Last Pain:  Vitals:   11/06/16 1245  TempSrc:   PainSc: [place]

## 2016-11-06 NOTE — Discharge Instructions (Signed)

## 2016-11-06 NOTE — Transfer of Care (Signed)
Immediate Anesthesia Transfer of Care Note  Patient: Evan Dorsey  Procedure(s) Performed: Procedure(s): URETEROSCOPY WITH HOLMIUM LASER LITHOTRIPSY/ RETROGRADE PYELOGRAM/ STENT (Right)  Patient Location: PACU  Anesthesia Type:General  Level of Consciousness: awake, alert , oriented and patient cooperative  Airway & Oxygen Therapy: Patient Spontanous Breathing and Patient connected to nasal cannula oxygen  Post-op Assessment: Report given to RN and Post -op Vital signs reviewed and stable  Post vital signs: Reviewed and stable  Last Vitals:  Vitals:   11/06/16 0758  BP: (!) 130/57  Pulse: (!) 52  Resp: 19  Temp: 36.9 C    Last Pain:  Vitals:   11/06/16 0758  TempSrc: Oral      Patients Stated Pain Goal: 7 (40/37/54 3606)  Complications: No apparent anesthesia complications

## 2016-11-06 NOTE — Anesthesia Preprocedure Evaluation (Signed)
Anesthesia Evaluation  Patient identified by MRN, date of birth, ID band Patient awake    Reviewed: Allergy & Precautions, H&P , NPO status , Patient's Chart, lab work & pertinent test results  Airway Mallampati: II   Neck ROM: full    Dental   Pulmonary neg pulmonary ROS,    breath sounds clear to auscultation       Cardiovascular hypertension, + CAD, + Past MI and + CABG  + dysrhythmias Atrial Fibrillation  Rhythm:regular Rate:Normal  Paroxysmal afib.  Takes eliquis.  Cleared for surgery by cardiology Stanford Breed)   Neuro/Psych    GI/Hepatic GERD  ,  Endo/Other    Renal/GU      Musculoskeletal  (+) Arthritis ,   Abdominal   Peds  Hematology   Anesthesia Other Findings   Reproductive/Obstetrics                             Anesthesia Physical Anesthesia Plan  ASA: III  Anesthesia Plan: General   Post-op Pain Management:    Induction: Intravenous  Airway Management Planned: LMA  Additional Equipment:   Intra-op Plan:   Post-operative Plan:   Informed Consent: I have reviewed the patients History and Physical, chart, labs and discussed the procedure including the risks, benefits and alternatives for the proposed anesthesia with the patient or authorized representative who has indicated his/her understanding and acceptance.     Plan Discussed with: CRNA, Anesthesiologist and Surgeon  Anesthesia Plan Comments:         Anesthesia Quick Evaluation

## 2016-11-10 ENCOUNTER — Encounter (HOSPITAL_BASED_OUTPATIENT_CLINIC_OR_DEPARTMENT_OTHER): Payer: Self-pay | Admitting: Urology

## 2016-11-13 DIAGNOSIS — N201 Calculus of ureter: Secondary | ICD-10-CM | POA: Diagnosis not present

## 2016-11-30 DIAGNOSIS — H43813 Vitreous degeneration, bilateral: Secondary | ICD-10-CM | POA: Diagnosis not present

## 2016-12-29 DIAGNOSIS — N200 Calculus of kidney: Secondary | ICD-10-CM | POA: Diagnosis not present

## 2016-12-29 DIAGNOSIS — N281 Cyst of kidney, acquired: Secondary | ICD-10-CM | POA: Diagnosis not present

## 2016-12-29 DIAGNOSIS — C61 Malignant neoplasm of prostate: Secondary | ICD-10-CM | POA: Diagnosis not present

## 2017-01-07 ENCOUNTER — Other Ambulatory Visit: Payer: Self-pay | Admitting: Cardiology

## 2017-03-17 ENCOUNTER — Telehealth: Payer: Self-pay | Admitting: Cardiology

## 2017-03-17 NOTE — Telephone Encounter (Signed)
Patient calling the office for samples of medication: ° ° °1.  What medication and dosage are you requesting samples for? Eliquis ° °2.  Are you currently out of this medication?  ° ° °

## 2017-03-17 NOTE — Telephone Encounter (Signed)
Called patient and left msg to inform samples available - advised to call back if questions.

## 2017-03-22 ENCOUNTER — Telehealth: Payer: Self-pay | Admitting: Cardiology

## 2017-03-22 NOTE — Telephone Encounter (Signed)
New message    Pt is calling asking for a call back. She said she is returning a call. Please call.

## 2017-03-22 NOTE — Telephone Encounter (Signed)
Pt's wife calling to schedule his 6 mo f/u w Dr. Stanford Breed in November - I tried to schedule but do not see available appts - please assist if able.

## 2017-04-16 ENCOUNTER — Encounter: Payer: Self-pay | Admitting: Cardiology

## 2017-04-20 NOTE — Progress Notes (Deleted)
HPI: Followup coronary artery disease status post coronary artery bypassing graft and atrial fibrillation. Patient had previous coronary artery bypassing graft in 1993. Last cardiac catheterization in 2005 showed occluded LAD, circumflex and right coronary artery. LIMA to the LAD was patent, saphenous vein graft to obtuse marginal patent. The saphenous vein graft to the PDA had a severe stenosis but there were collaterals from the obtuse marginal and PLA. Medical therapy recommended. Echocardiogram in March of 2012 showed normal LV function, mild to moderate left atrial enlargement, mild right atrial enlargement, trace aortic and mitral regurgitation. Nuclear study 7/17 showed EF 59 and no ischemia. Since he was last seen,   Current Outpatient Medications  Medication Sig Dispense Refill  . amiodarone (PACERONE) 200 MG tablet TAKE ONE TABLET BY MOUTH DAILY 30 tablet 6  . amLODipine (NORVASC) 5 MG tablet TAKE ONE TABLET BY MOUTH EVERY DAY 90 tablet 3  . apixaban (ELIQUIS) 2.5 MG TABS tablet Take 1 tablet (2.5 mg total) by mouth 2 (two) times daily. 60 tablet   . calcium carbonate (TUMS - DOSED IN MG ELEMENTAL CALCIUM) 500 MG chewable tablet Chew 2 tablets by mouth daily.    . cholecalciferol (VITAMIN D) 1000 units tablet Take 1,000 Units by mouth daily.    . clobetasol cream (TEMOVATE) 4.62 % Apply 1 application topically 2 (two) times daily.     Marland Kitchen HYDROcodone-acetaminophen (NORCO) 10-325 MG tablet Take 1-2 tablets by mouth every 4 (four) hours as needed for moderate pain. Maximum dose per 24 hours - 8 pills 20 tablet 0  . ibuprofen (ADVIL,MOTRIN) 600 MG tablet Take 600 mg by mouth as needed. For severe kidney stone pain    . ketoconazole (NIZORAL) 2 % cream Apply 1 application topically daily as needed for irritation.     . Methylcellulose, Laxative, 500 MG TABS Take 2 tablets by mouth 2 (two) times daily.     Marland Kitchen NITROSTAT 0.4 MG SL tablet PLACE 1 TABLET UNDER THE TONGUE EVERY 5 MINUTES IF  NEEDED FOR CHEST PAIN. MAX = 3 TABS/EPISODE. 25 tablet 0  . phenazopyridine (PYRIDIUM) 200 MG tablet Take 1 tablet (200 mg total) by mouth 3 (three) times daily as needed for pain. 20 tablet 0  . rosuvastatin (CRESTOR) 20 MG tablet Take 20 mg by mouth daily.      No current facility-administered medications for this visit.      Past Medical History:  Diagnosis Date  . Adenomatous polyp   . Arthritis    "left shoulder" (03/02/2013)  . Atrial fibrillation ([place])   . Bladder cancer ([place]) 07/31/2011  . CAD (coronary artery disease)    a. remote CABG b. prior cardiac cath 2005 c. Lexiscan Myoview 07/08/12 w/o evidence of ischemia, EF 53%  . Diverticulosis   . Elevated PSA   . Frequent urination    "day and night" (03/02/2013)  . GERD (gastroesophageal reflux disease)   . Gout    "been a long time ago" (03/02/2013)  . History of kidney stones   . HLD (hyperlipidemia)   . HTN (hypertension)   . Myocardial infarction [facility]) 1990's   silent    Past Surgical History:  Procedure Laterality Date  . CARDIAC CATHETERIZATION     total occlusion to LAD, LCX, RCA. LIMA patent, normal seq SVG to OM, severe stenosis in SVG to PDA, but collaterals to PLA from OM. RCA went to PDA. treated medically   . CARDIOVASCULAR STRESS TEST  02/10/08   6 mets, poor  tolerance, no chest pain   . CATARACT EXTRACTION W/ INTRAOCULAR LENS IMPLANT Right 2014  . CORONARY ARTERY BYPASS GRAFT  08/1991   "CABG X4" (03/02/2013)  . CYSTOSCOPY W/ STONE MANIPULATION    . ORIF ACETABULAR FRACTURE Left 04/2007   trimalleolar fracture  . TOTAL SHOULDER ARTHROPLASTY Left 03/02/2013    Social History   Socioeconomic History  . Marital status: Married    Spouse name: Not on file  . Number of children: 3  . Years of education: Not on file  . Highest education level: Not on file  Social Needs  . Financial resource strain: Not on file  . Food insecurity - worry: Not on file  . Food insecurity - inability: Not on file  .  Transportation needs - medical: Not on file  . Transportation needs - non-medical: Not on file  Occupational History  . Occupation: retired Psychologist, sport and exercise  Tobacco Use  . Smoking status: Never Smoker  . Smokeless tobacco: Never Used  Substance and Sexual Activity  . Alcohol use: No  . Drug use: No  . Sexual activity: No  Other Topics Concern  . Not on file  Social History Narrative   Lives at home with wife.      Family History  Problem Relation Age of Onset  . CAD Father 75  . CAD Brother 24  . CAD Brother 67    ROS: no fevers or chills, productive cough, hemoptysis, dysphasia, odynophagia, melena, hematochezia, dysuria, hematuria, rash, seizure activity, orthopnea, PND, pedal edema, claudication. Remaining systems are negative.  Physical Exam: Well-developed well-nourished in no acute distress.  Skin is warm and dry.  HEENT is normal.  Neck is supple.  Chest is clear to auscultation with normal expansion.  Cardiovascular exam is regular rate and rhythm.  Abdominal exam nontender or distended. No masses palpated. Extremities show no edema. neuro grossly intact  ECG- personally reviewed  A/P  1  [name], MD

## 2017-04-30 ENCOUNTER — Ambulatory Visit (HOSPITAL_COMMUNITY)
Admission: RE | Admit: 2017-04-30 | Discharge: 2017-04-30 | Disposition: A | Discharge status: Home / Self Care | Payer: Medicare Other | Source: Ambulatory Visit | Attending: Cardiology | Admitting: Cardiology

## 2017-04-30 ENCOUNTER — Encounter: Payer: Self-pay | Admitting: Cardiology

## 2017-04-30 ENCOUNTER — Ambulatory Visit (INDEPENDENT_AMBULATORY_CARE_PROVIDER_SITE_OTHER): Payer: Medicare Other | Admitting: Cardiology

## 2017-04-30 ENCOUNTER — Ambulatory Visit: Payer: Medicare Other | Admitting: Cardiology

## 2017-04-30 VITALS — BP 156/81 | HR 60 | Ht 71.0 in | Wt 239.0 lb

## 2017-04-30 DIAGNOSIS — I480 Paroxysmal atrial fibrillation: Secondary | ICD-10-CM

## 2017-04-30 DIAGNOSIS — I2510 Atherosclerotic heart disease of native coronary artery without angina pectoris: Secondary | ICD-10-CM | POA: Diagnosis not present

## 2017-04-30 DIAGNOSIS — E7800 Pure hypercholesterolemia, unspecified: Secondary | ICD-10-CM

## 2017-04-30 DIAGNOSIS — Z96612 Presence of left artificial shoulder joint: Secondary | ICD-10-CM | POA: Insufficient documentation

## 2017-04-30 DIAGNOSIS — I700 Atherosclerosis of aorta: Secondary | ICD-10-CM | POA: Diagnosis not present

## 2017-04-30 DIAGNOSIS — Z951 Presence of aortocoronary bypass graft: Secondary | ICD-10-CM | POA: Insufficient documentation

## 2017-04-30 DIAGNOSIS — I4891 Unspecified atrial fibrillation: Secondary | ICD-10-CM | POA: Diagnosis not present

## 2017-04-30 DIAGNOSIS — I10 Essential (primary) hypertension: Secondary | ICD-10-CM

## 2017-04-30 NOTE — Progress Notes (Signed)
HPI: Followup coronary artery disease status post coronary artery bypassing graft and atrial fibrillation. Patient had previous coronary artery bypassing graft in 1993. Last cardiac catheterization in 2005 showed occluded LAD, circumflex and right coronary artery. LIMA to the LAD was patent, saphenous vein graft to obtuse marginal patent. The saphenous vein graft to the PDA had a severe stenosis but there were collaterals from the obtuse marginal and PLA. Medical therapy recommended. Echocardiogram in March of 2012 showed normal LV function, mild to moderate left atrial enlargement, mild right atrial enlargement, trace aortic and mitral regurgitation. Nuclear study 7/17 showed EF 59 and no ischemia. Since he was last seen, patient denies dyspnea, chest pain, palpitations No bleeding.  Current Outpatient Medications  Medication Sig Dispense Refill  . amiodarone (PACERONE) 200 MG tablet TAKE ONE TABLET BY MOUTH DAILY 30 tablet 6  . amLODipine (NORVASC) 5 MG tablet TAKE ONE TABLET BY MOUTH EVERY DAY 90 tablet 3  . apixaban (ELIQUIS) 2.5 MG TABS tablet Take 1 tablet (2.5 mg total) by mouth 2 (two) times daily. 60 tablet   . calcium carbonate (TUMS - DOSED IN MG ELEMENTAL CALCIUM) 500 MG chewable tablet Chew 2 tablets by mouth daily.    . cholecalciferol (VITAMIN D) 1000 units tablet Take 1,000 Units by mouth daily.    Marland Kitchen ibuprofen (ADVIL,MOTRIN) 600 MG tablet Take 600 mg by mouth as needed. For severe kidney stone pain    . Methylcellulose, Laxative, 500 MG TABS Take 2 tablets by mouth 2 (two) times daily.     Marland Kitchen NITROSTAT 0.4 MG SL tablet PLACE 1 TABLET UNDER THE TONGUE EVERY 5 MINUTES IF NEEDED FOR CHEST PAIN. MAX = 3 TABS/EPISODE. 25 tablet 0  . rosuvastatin (CRESTOR) 20 MG tablet Take 20 mg by mouth daily.      No current facility-administered medications for this visit.      Past Medical History:  Diagnosis Date  . Adenomatous polyp   . Arthritis    "left shoulder" (03/02/2013)  .  Atrial fibrillation ([place])   . Bladder cancer ([place]) 07/31/2011  . CAD (coronary artery disease)    a. remote CABG b. prior cardiac cath 2005 c. Lexiscan Myoview 07/08/12 w/o evidence of ischemia, EF 53%  . Diverticulosis   . Elevated PSA   . Frequent urination    "day and night" (03/02/2013)  . GERD (gastroesophageal reflux disease)   . Gout    "been a long time ago" (03/02/2013)  . History of kidney stones   . HLD (hyperlipidemia)   . HTN (hypertension)   . Myocardial infarction [facility]) 1990's   silent    Past Surgical History:  Procedure Laterality Date  . CARDIAC CATHETERIZATION     total occlusion to LAD, LCX, RCA. LIMA patent, normal seq SVG to OM, severe stenosis in SVG to PDA, but collaterals to PLA from OM. RCA went to PDA. treated medically   . CARDIOVASCULAR STRESS TEST  02/10/08   6 mets, poor tolerance, no chest pain   . CATARACT EXTRACTION W/ INTRAOCULAR LENS IMPLANT Right 2014  . CORONARY ARTERY BYPASS GRAFT  08/1991   "CABG X4" (03/02/2013)  . CYSTOSCOPY W/ STONE MANIPULATION    . LEFT TOTAL SHOULDER ARTHROPLASTY Left 03/02/2013   Performed by Marin Shutter, MD at [facility] OR  . ORIF ACETABULAR FRACTURE Left 04/2007   trimalleolar fracture  . TOTAL SHOULDER ARTHROPLASTY Left 03/02/2013  . TRANSURETHRAL RESECTION OF BLADDER TUMOR (TURBT) N/A 07/31/2011   Performed by Karsten Ro,  Thana Farr, MD at [facility] ORS  . URETEROSCOPY WITH HOLMIUM LASER LITHOTRIPSY/ RETROGRADE PYELOGRAM/ STENT Right 11/06/2016   Performed by Kathie Rhodes, MD at [place] History   Socioeconomic History  . Marital status: Married    Spouse name: Not on file  . Number of children: 3  . Years of education: Not on file  . Highest education level: Not on file  Social Needs  . Financial resource strain: Not on file  . Food insecurity - worry: Not on file  . Food insecurity - inability: Not on file  . Transportation needs - medical: Not on file  . Transportation needs - non-medical: Not  on file  Occupational History  . Occupation: retired Psychologist, sport and exercise  Tobacco Use  . Smoking status: Never Smoker  . Smokeless tobacco: Never Used  Substance and Sexual Activity  . Alcohol use: No  . Drug use: No  . Sexual activity: No  Other Topics Concern  . Not on file  Social History Narrative   Lives at home with wife.      Family History  Problem Relation Age of Onset  . CAD Father 94  . CAD Brother 48  . CAD Brother 45    ROS: no fevers or chills, productive cough, hemoptysis, dysphasia, odynophagia, melena, hematochezia, dysuria, hematuria, rash, seizure activity, orthopnea, PND, pedal edema, claudication. Remaining systems are negative.  Physical Exam: Well-developed well-nourished in no acute distress.  Skin is warm and dry.  HEENT is normal.  Neck is supple.  Chest is clear to auscultation with normal expansion.  Cardiovascular exam is regular rate and rhythm.  Abdominal exam nontender or distended. No masses palpated. Extremities show no edema. neuro grossly intact  ECG- sinus rhythm with PACs. Right bundle branch block.personally reviewed  A/P  1 coronary artery disease status post coronary artery bypassing graft-patient continues to do well with no chest pain.Continue statin. No aspirin given need for anticoagulation.  2 paroxysmal atrial fibrillation-he remains in sinus rhythm. Continue amiodarone and apixaban. Check chest x-ray, TSH, liver functions, hemoglobin and renal function.  3 hypertension-blood pressure is mildly elevated. However he states typically controlled at home. Follow blood pressure and increase amlodipine if needed.  4 hyperlipidemia-continue statin.  [name], MD

## 2017-04-30 NOTE — Patient Instructions (Signed)
Medication Instructions:   NO CHANGE  Labwork:  Your physician recommends that you HAVE LAB WORK TODAY  Testing/Procedures:  A chest x-ray takes a picture of the organs and structures inside the chest, including the heart, lungs, and blood vessels. This test can show several things, including, whether the heart is enlarges; whether fluid is building up in the lungs; and whether pacemaker / defibrillator leads are still in place. AT  HOSPITAL  Follow-Up:  Your physician wants you to follow-up in: 6 MONTHS WITH DR CRENSHAW You will receive a reminder letter in the mail two months in advance. If you don't receive a letter, please call our office to schedule the follow-up appointment.   If you need a refill on your cardiac medications before your next appointment, please call your pharmacy.    

## 2017-05-01 LAB — COMPREHENSIVE METABOLIC PANEL
A/G RATIO: 1.6 (ref 1.2–2.2)
ALBUMIN: 4.4 g/dL (ref 3.5–4.7)
ALT: 36 IU/L (ref 0–44)
AST: 41 IU/L — ABNORMAL HIGH (ref 0–40)
Alkaline Phosphatase: 63 IU/L (ref 39–117)
BILIRUBIN TOTAL: 0.3 mg/dL (ref 0.0–1.2)
BUN / CREAT RATIO: 13 (ref 10–24)
BUN: 15 mg/dL (ref 8–27)
CALCIUM: 9.8 mg/dL (ref 8.6–10.2)
CHLORIDE: 103 mmol/L (ref 96–106)
CO2: 22 mmol/L (ref 20–29)
Creatinine, Ser: 1.12 mg/dL (ref 0.76–1.27)
GFR, EST AFRICAN AMERICAN: 70 mL/min/{1.73_m2} (ref >59)
GFR, EST NON AFRICAN AMERICAN: 61 mL/min/{1.73_m2} (ref >59)
GLOBULIN, TOTAL: 2.8 g/dL (ref 1.5–4.5)
Glucose: 87 mg/dL (ref 65–99)
POTASSIUM: 4.4 mmol/L (ref 3.5–5.2)
SODIUM: 141 mmol/L (ref 134–144)
TOTAL PROTEIN: 7.2 g/dL (ref 6.0–8.5)

## 2017-05-01 LAB — CBC
HEMATOCRIT: 42.6 % (ref 37.5–51.0)
Hemoglobin: 13.5 g/dL (ref 13.0–17.7)
MCH: 27.8 pg (ref 26.6–33.0)
MCHC: 31.7 g/dL (ref 31.5–35.7)
MCV: 88 fL (ref 79–97)
PLATELETS: 319 10*3/uL (ref 150–379)
RBC: 4.86 x10E6/uL (ref 4.14–5.80)
RDW: 14.4 % (ref 12.3–15.4)
WBC: 10.1 10*3/uL (ref 3.4–10.8)

## 2017-05-01 LAB — TSH: TSH: 5.39 u[IU]/mL — ABNORMAL HIGH (ref 0.450–4.500)

## 2017-05-03 ENCOUNTER — Telehealth: Payer: Self-pay | Admitting: *Deleted

## 2017-05-03 MED ORDER — APIXABAN 5 MG PO TABS
5.0000 mg | ORAL_TABLET | Freq: Two times a day (BID) | ORAL | 6 refills | Status: DC
Start: 2017-05-03 — End: 2018-01-20

## 2017-05-03 NOTE — Telephone Encounter (Signed)
Spoke with pt wife, aware of medication change. New script sent to the pharmacy  

## 2017-05-03 NOTE — Telephone Encounter (Addendum)
-----   Message from Lelon Perla, MD sent at 05/01/2017  9:42 AM EST ----- Increase apixaban to 5 mg BID [name]   Left message for pt to call

## 2017-05-31 ENCOUNTER — Telehealth: Payer: Self-pay | Admitting: Cardiology

## 2017-05-31 NOTE — Telephone Encounter (Signed)
New message ° ° ° °Patient calling the office for samples of medication: ° ° °1.  What medication and dosage are you requesting samples for? apixaban (ELIQUIS) 5 MG TABS tablet ° °2.  Are you currently out of this medication? no ° ° °

## 2017-05-31 NOTE — Telephone Encounter (Signed)
Returned call to patient's wife.Advised samples of Eliquis 2.5 mg samples take 2 tablets left at [facility] office front desk.

## 2017-07-05 DIAGNOSIS — C61 Malignant neoplasm of prostate: Secondary | ICD-10-CM | POA: Diagnosis not present

## 2017-07-28 DIAGNOSIS — C61 Malignant neoplasm of prostate: Secondary | ICD-10-CM | POA: Diagnosis not present

## 2017-08-02 DIAGNOSIS — C61 Malignant neoplasm of prostate: Secondary | ICD-10-CM | POA: Diagnosis not present

## 2017-08-02 DIAGNOSIS — R351 Nocturia: Secondary | ICD-10-CM | POA: Diagnosis not present

## 2017-08-02 DIAGNOSIS — Z8551 Personal history of malignant neoplasm of bladder: Secondary | ICD-10-CM | POA: Diagnosis not present

## 2017-08-06 ENCOUNTER — Other Ambulatory Visit: Payer: Self-pay | Admitting: Cardiology

## 2017-08-06 NOTE — Telephone Encounter (Signed)
REFILL 

## 2017-09-06 ENCOUNTER — Telehealth: Payer: Self-pay | Admitting: Cardiology

## 2017-09-06 NOTE — Telephone Encounter (Signed)
Eliquis 5 mg #2 lot #[id number] exp 6/21 at front desk for pick up. Advised wife

## 2017-09-06 NOTE — Telephone Encounter (Signed)
New Message    Patient calling the office for samples of medication:   1.  What medication and dosage are you requesting samples for?Eliquis 5mg   2.  Are you currently out of this medication? 3 left

## 2017-09-07 DIAGNOSIS — L259 Unspecified contact dermatitis, unspecified cause: Secondary | ICD-10-CM | POA: Diagnosis not present

## 2017-09-07 DIAGNOSIS — Z8551 Personal history of malignant neoplasm of bladder: Secondary | ICD-10-CM | POA: Diagnosis not present

## 2017-09-07 DIAGNOSIS — E7800 Pure hypercholesterolemia, unspecified: Secondary | ICD-10-CM | POA: Diagnosis not present

## 2017-09-07 DIAGNOSIS — I4891 Unspecified atrial fibrillation: Secondary | ICD-10-CM | POA: Diagnosis not present

## 2017-09-07 DIAGNOSIS — Z7901 Long term (current) use of anticoagulants: Secondary | ICD-10-CM | POA: Diagnosis not present

## 2017-09-07 DIAGNOSIS — Z8546 Personal history of malignant neoplasm of prostate: Secondary | ICD-10-CM | POA: Diagnosis not present

## 2017-09-07 DIAGNOSIS — K5900 Constipation, unspecified: Secondary | ICD-10-CM | POA: Diagnosis not present

## 2017-09-07 DIAGNOSIS — N390 Urinary tract infection, site not specified: Secondary | ICD-10-CM | POA: Diagnosis not present

## 2017-09-07 DIAGNOSIS — Z0001 Encounter for general adult medical examination with abnormal findings: Secondary | ICD-10-CM | POA: Diagnosis not present

## 2017-09-07 DIAGNOSIS — H6123 Impacted cerumen, bilateral: Secondary | ICD-10-CM | POA: Diagnosis not present

## 2017-09-07 DIAGNOSIS — I2510 Atherosclerotic heart disease of native coronary artery without angina pectoris: Secondary | ICD-10-CM | POA: Diagnosis not present

## 2017-09-07 DIAGNOSIS — I10 Essential (primary) hypertension: Secondary | ICD-10-CM | POA: Diagnosis not present

## 2017-09-07 DIAGNOSIS — H9319 Tinnitus, unspecified ear: Secondary | ICD-10-CM | POA: Diagnosis not present

## 2017-09-14 DIAGNOSIS — R197 Diarrhea, unspecified: Secondary | ICD-10-CM | POA: Diagnosis not present

## 2017-10-24 NOTE — Progress Notes (Signed)
Cardiology Office Note   Date:  10/25/2017   ID:  Evan Dorsey, DOB [birth year], MRN [id number]  PCP:  Deland Pretty, MD  Cardiologist:  Dr. Stanford Breed Chief Complaint  Patient presents with  . Follow-up  . Edema    feet     History of Present Illness: Evan Dorsey is a 84 y.o. male who presents for ongoing assessment and management of coronary artery disease with history of coronary artery bypass grafting in 1993 (LIMA to LAD, SVG to OM, SVG to PDA, with severe stenosis however collateral circulation was found from the OM to the PLA-Per repeat cardiac catheterization on 2005).    Other history includes mild to moderate left atrial enlargement, atrial fibrillation, trace aortic and mitral valve regurgitation.  Patient was last seen in the office by Dr. Stanford Breed on 04/30/2017.  The patient was continued on amiodarone and apixaban.  He comes today without any complaints of shortness of breath, dizziness, hemoptysis, fatigue, or chest discomfort.  The patient is not very active.  He is being followed by his primary care for labs.  He is medically compliant.  Past Medical History:  Diagnosis Date  . Adenomatous polyp   . Arthritis    "left shoulder" (03/02/2013)  . Atrial fibrillation ([place])   . Bladder cancer ([place]) 07/31/2011  . CAD (coronary artery disease)    a. remote CABG b. prior cardiac cath 2005 c. Lexiscan Myoview 07/08/12 w/o evidence of ischemia, EF 53%  . Diverticulosis   . Elevated PSA   . Frequent urination    "day and night" (03/02/2013)  . GERD (gastroesophageal reflux disease)   . Gout    "been a long time ago" (03/02/2013)  . History of kidney stones   . HLD (hyperlipidemia)   . HTN (hypertension)   . Myocardial infarction [facility]) 1990's   silent    Past Surgical History:  Procedure Laterality Date  . CARDIAC CATHETERIZATION     total occlusion to LAD, LCX, RCA. LIMA patent, normal seq SVG to OM, severe stenosis in SVG to PDA, but collaterals to PLA from OM.  RCA went to PDA. treated medically   . CARDIOVASCULAR STRESS TEST  02/10/08   6 mets, poor tolerance, no chest pain   . CATARACT EXTRACTION W/ INTRAOCULAR LENS IMPLANT Right 2014  . CORONARY ARTERY BYPASS GRAFT  08/1991   "CABG X4" (03/02/2013)  . CYSTOSCOPY W/ STONE MANIPULATION    . ORIF ACETABULAR FRACTURE Left 04/2007   trimalleolar fracture  . TOTAL SHOULDER ARTHROPLASTY Left 03/02/2013  . TOTAL SHOULDER ARTHROPLASTY Left 03/02/2013   Procedure: LEFT TOTAL SHOULDER ARTHROPLASTY;  Surgeon: Marin Shutter, MD;  Location: [place];  Service: Orthopedics;  Laterality: Left;  . TRANSURETHRAL RESECTION OF BLADDER TUMOR  07/31/2011   Procedure: TRANSURETHRAL RESECTION OF BLADDER TUMOR (TURBT);  Surgeon: Claybon Jabs, MD;  Location: [facility] ORS;  Service: Urology;  Laterality: N/A;  with [place]  . URETEROSCOPY WITH HOLMIUM LASER LITHOTRIPSY Right 11/06/2016   Procedure: URETEROSCOPY WITH HOLMIUM LASER LITHOTRIPSY/ RETROGRADE PYELOGRAM/ STENT;  Surgeon: Kathie Rhodes, MD;  Location[facility];  Service: Urology;  Laterality: Right;     Current Outpatient Medications  Medication Sig Dispense Refill  . acetaminophen (TYLENOL) 500 MG tablet Take 500 mg by mouth every 6 (six) hours as needed.    Marland Kitchen amiodarone (PACERONE) 200 MG tablet TAKE ONE TABLET BY MOUTH DAILY 30 tablet 6  . amLODipine (NORVASC) 5 MG tablet TAKE ONE TABLET BY MOUTH EVERY DAY  90 tablet 3  . apixaban (ELIQUIS) 5 MG TABS tablet Take 1 tablet (5 mg total) 2 (two) times daily by mouth. 60 tablet 6  . calcium carbonate (TUMS - DOSED IN MG ELEMENTAL CALCIUM) 500 MG chewable tablet Chew 2 tablets by mouth daily.    . cholecalciferol (VITAMIN D) 1000 units tablet Take 1,000 Units by mouth daily.    . Methylcellulose, Laxative, 500 MG TABS Take 2 tablets by mouth 2 (two) times daily.     Marland Kitchen MYRBETRIQ 50 MG TB24 tablet Take 50 mg by mouth daily.  11  . NITROSTAT 0.4 MG SL tablet PLACE 1 TABLET UNDER THE TONGUE EVERY 5 MINUTES IF  NEEDED FOR CHEST PAIN. MAX = 3 TABS/EPISODE. 25 tablet 0  . rosuvastatin (CRESTOR) 20 MG tablet Take 20 mg by mouth daily.      No current facility-administered medications for this visit.     Allergies:   Patient has no known allergies.    Social History:  The patient  reports that he has never smoked. He has never used smokeless tobacco. He reports that he does not drink alcohol or use drugs.   Family History:  The patient's family history includes CAD (age of onset: 64) in his father; CAD (age of onset: 27) in his brother; CAD (age of onset: 43) in his brother.    ROS: All other systems are reviewed and negative. Unless otherwise mentioned in H&P    PHYSICAL EXAM: VS:  BP 136/72   Pulse (!) 52   Ht 5\' 11"  (1.803 m)   Wt 233 lb (105.7 kg)   BMI 32.50 kg/m  , BMI Body mass index is 32.5 kg/m. GEN: Well nourished, well developed, in no acute distress  HEENT: normal  Neck: no JVD, carotid bruits, or masses Cardiac: RRR; 2/6 systolic murmur, rubs, or gallops,mild non-pitting edema  Respiratory:  Clear to auscultation bilaterally, normal work of breathing GI: soft, nontender, nondistended, + BS MS: no deformity or atrophy  Skin: warm and dry, no rash Neuro:  Strength and sensation are intact Psych: euthymic mood, full affect   EKG: Sinus bradycardia heart rate 52 bpm with right bundle branch block occasional arrhythmia.  Recent Labs: 04/30/2017: ALT 36; BUN 15; Creatinine, Ser 1.12; Hemoglobin 13.5; Platelets 319; Potassium 4.4; Sodium 141; TSH 5.390    Lipid Panel    Component Value Date/Time   CHOL 131 02/01/2014 1409   TRIG 114 02/01/2014 1409   HDL 50 02/01/2014 1409   CHOLHDL 2.6 02/01/2014 1409   VLDL 23 02/01/2014 1409   LDLCALC 58 02/01/2014 1409      Wt Readings from Last 3 Encounters:  10/25/17 233 lb (105.7 kg)  04/30/17 239 lb (108.4 kg)  11/06/16 227 lb 8 oz (103.2 kg)      Other studies Reviewed:  NM Study 12/26/2015  Study Highlights     The left ventricular ejection fraction is normal (55-65%).  Nuclear stress EF: 59%. Post CABG septal wall hypokinesis.  There was no ST segment deviation noted during stress.  Defect 1: There is a small defect of mild severity present in the apex location. There is basal inferior wall infarct. No ischemia.  This is a low risk study. No significant ischemia identified.    ASSESSMENT AND PLAN:  1.  Currently artery disease: History of coronary artery bypass grafting, with most recent cardiac catheterization revealing patent grafts but severe stenosis of the SVG to PDA with collaterals noted.  Medical therapy was recommended.  The patient comes today without any complaints of chest pain dyspnea or fatigue.  He admits to not being very active.  No changes in his medication regimen at this time.  He remains on amiodarone only for heart rate control in the setting atrial fib.  I do not see that he is on statin therapy currently.  However, on review of most recent labs in the computer his cholesterol was 131 with an LDL of 58.  He is being followed by PCP for this.  2.  Atrial fibrillation: Remains on amiodarone and Eliquis.  Samples of Eliquis are provided.  He has had recent labs by Dr.Pharr. No changes in his medications.   3. Aortic Valve Murmur: He has not had an echocardiogram since 2012.  I am going to repeat his echocardiogram for evaluation of aortic valve and mitral valve.  4. Hypertension: Well controlled on amiodarone and amlodipine.   Current medicines are reviewed at length with the patient today.     Labs/ tests ordered today include:   Evan Dorsey, ANP, AACC   10/25/2017 10:30 AM    [facility] [address], [place], [place] [address] Phone: [phone]; Fax: [phone]

## 2017-10-25 ENCOUNTER — Encounter: Payer: Self-pay | Admitting: Adult Health

## 2017-10-25 ENCOUNTER — Ambulatory Visit (INDEPENDENT_AMBULATORY_CARE_PROVIDER_SITE_OTHER): Payer: Medicare Other | Admitting: Adult Health

## 2017-10-25 VITALS — BP 136/72 | HR 52 | Ht 71.0 in | Wt 233.0 lb

## 2017-10-25 DIAGNOSIS — I480 Paroxysmal atrial fibrillation: Secondary | ICD-10-CM | POA: Diagnosis not present

## 2017-10-25 DIAGNOSIS — Z79899 Other long term (current) drug therapy: Secondary | ICD-10-CM | POA: Diagnosis not present

## 2017-10-25 DIAGNOSIS — I10 Essential (primary) hypertension: Secondary | ICD-10-CM

## 2017-10-25 DIAGNOSIS — I358 Other nonrheumatic aortic valve disorders: Secondary | ICD-10-CM

## 2017-10-25 NOTE — Patient Instructions (Signed)
Medication Instructions:  No medication changed    Testing/Procedures: Your physician has requested that you have an echocardiogram. Echocardiography is a painless test that uses sound waves to create images of your heart. It provides your doctor with information about the size and shape of your heart and how well your heart's chambers and valves are working. This procedure takes approximately one hour. There are no restrictions for this procedure. [place]: Your physician wants you to follow-up in: 6 month with Dr. Stanford Breed. You will receive a reminder letter in the mail two months in advance. If you don't receive a letter, please call our office to schedule the follow-up appointment.   Any Other Special Instructions Will Be Listed Below (If Applicable).     If you need a refill on your cardiac medications before your next appointment, please call your pharmacy.

## 2017-11-01 ENCOUNTER — Ambulatory Visit (HOSPITAL_COMMUNITY): Payer: Medicare Other | Attending: Cardiology

## 2017-11-01 ENCOUNTER — Other Ambulatory Visit: Payer: Self-pay

## 2017-11-01 DIAGNOSIS — E785 Hyperlipidemia, unspecified: Secondary | ICD-10-CM | POA: Diagnosis not present

## 2017-11-01 DIAGNOSIS — I272 Pulmonary hypertension, unspecified: Secondary | ICD-10-CM | POA: Insufficient documentation

## 2017-11-01 DIAGNOSIS — I4891 Unspecified atrial fibrillation: Secondary | ICD-10-CM | POA: Diagnosis not present

## 2017-11-01 DIAGNOSIS — I219 Acute myocardial infarction, unspecified: Secondary | ICD-10-CM | POA: Insufficient documentation

## 2017-11-01 DIAGNOSIS — R29898 Other symptoms and signs involving the musculoskeletal system: Secondary | ICD-10-CM | POA: Diagnosis not present

## 2017-11-01 DIAGNOSIS — I119 Hypertensive heart disease without heart failure: Secondary | ICD-10-CM | POA: Insufficient documentation

## 2017-11-01 DIAGNOSIS — I2510 Atherosclerotic heart disease of native coronary artery without angina pectoris: Secondary | ICD-10-CM | POA: Insufficient documentation

## 2017-11-01 DIAGNOSIS — Z951 Presence of aortocoronary bypass graft: Secondary | ICD-10-CM | POA: Diagnosis not present

## 2017-11-01 DIAGNOSIS — I080 Rheumatic disorders of both mitral and aortic valves: Secondary | ICD-10-CM | POA: Diagnosis not present

## 2017-11-01 DIAGNOSIS — I77810 Thoracic aortic ectasia: Secondary | ICD-10-CM | POA: Diagnosis not present

## 2017-11-01 DIAGNOSIS — I358 Other nonrheumatic aortic valve disorders: Secondary | ICD-10-CM | POA: Diagnosis not present

## 2017-11-02 ENCOUNTER — Other Ambulatory Visit (HOSPITAL_COMMUNITY): Payer: Medicare Other

## 2017-12-23 DIAGNOSIS — M25561 Pain in right knee: Secondary | ICD-10-CM | POA: Diagnosis not present

## 2017-12-23 DIAGNOSIS — M1711 Unilateral primary osteoarthritis, right knee: Secondary | ICD-10-CM | POA: Diagnosis not present

## 2017-12-30 ENCOUNTER — Other Ambulatory Visit: Payer: Self-pay | Admitting: Cardiology

## 2017-12-30 NOTE — Telephone Encounter (Signed)
Rx request sent to pharmacy.  

## 2018-01-13 DIAGNOSIS — H43813 Vitreous degeneration, bilateral: Secondary | ICD-10-CM | POA: Diagnosis not present

## 2018-01-17 DIAGNOSIS — C61 Malignant neoplasm of prostate: Secondary | ICD-10-CM | POA: Diagnosis not present

## 2018-01-20 ENCOUNTER — Telehealth: Payer: Self-pay | Admitting: *Deleted

## 2018-01-20 MED ORDER — APIXABAN 5 MG PO TABS: 5.0000 mg | ORAL_TABLET | Freq: Two times a day (BID) | ORAL | 0 refills | Status: DC

## 2018-01-20 NOTE — Telephone Encounter (Signed)
Message left on the refill vm requesting eliquis samples for the patient. Call back number provided [phone]. Thanks, MI

## 2018-01-24 DIAGNOSIS — Z8551 Personal history of malignant neoplasm of bladder: Secondary | ICD-10-CM | POA: Diagnosis not present

## 2018-01-24 DIAGNOSIS — C61 Malignant neoplasm of prostate: Secondary | ICD-10-CM | POA: Diagnosis not present

## 2018-01-26 ENCOUNTER — Telehealth: Payer: Self-pay | Admitting: Cardiology

## 2018-01-26 NOTE — Telephone Encounter (Signed)
New Message       [facility] Pre-operative Risk Assessment    Request for surgical clearance:  1. What type of surgery is being performed? Prostate Biopsy   2. When is this surgery scheduled? TBD  3. What type of clearance is required (medical clearance vs. Pharmacy clearance to hold med vs. Both)? Both  4. Are there any medications that need to be held prior to surgery and how long? Eliquis hold for 3 days. They also want to see if its out to start levokiln 750 just on the day of the procedure   5. Practice name and name of physician performing surgery? [facility] Dr. Cristina Gong   6. What is your office phone number [phone]   7.   What is your office fax number [phone]  8.   Anesthesia type (None, local, MAC, general) ? None    Avaletta L Williams 01/26/2018, 12:43 PM  _________________________________________________________________   (provider comments below)

## 2018-01-27 NOTE — Telephone Encounter (Signed)
I called patient and was able to medically clear him for surgery. Unfortunately, I don't yet have eliquis recommendations. Will route to pharmacy.

## 2018-01-27 NOTE — Telephone Encounter (Signed)
Pt takes Eliquis for afib with CHADS2VASc score of 4 (age x2, HTN, CAD). CrCl 5mL/min. Ok to hold Eliquis for 2 days prior to procedure per protocol.

## 2018-01-27 NOTE — Telephone Encounter (Signed)
   Primary Cardiologist: No primary care provider on file.  Chart reviewed as part of pre-operative protocol coverage. Patient was contacted 01/27/2018 in reference to pre-operative risk assessment for pending surgery as outlined below.  ALEE KATEN was last seen on 10/25/17 by Jory Sims NP.  Since that day, SONYA PUCCI has done well. He reports no new symptoms. He can nearly complete 4.0 METS, but this is his baseline.  Per pharmacy: Pt takes Eliquis for afib with CHADS2VASc score of 4 (age x2, HTN, CAD). CrCl 55mL/min. Ok to hold Eliquis for 2 days prior to procedure per protocol.  Therefore, based on ACC/AHA guidelines, the patient would be at acceptable risk for the planned procedure without further cardiovascular testing.   I will route this recommendation to the requesting party via Epic fax function and remove from pre-op pool.  Please call with questions.  Tami Lin Develle Sievers, PA 01/27/2018, 4:59 PM

## 2018-02-10 ENCOUNTER — Telehealth: Payer: Self-pay | Admitting: Cardiology

## 2018-02-10 NOTE — Telephone Encounter (Signed)
Spoke with Davy Pique at [facility] and she stated the clearance letter was received.

## 2018-02-10 NOTE — Telephone Encounter (Signed)
New Message         Patient needs Dr. Stanford Breed to contact Dr .Karsten Ro @ [facility] to ok patient to stop Eliquis for a biopsy. Pls advise.

## 2018-02-10 NOTE — Telephone Encounter (Signed)
Spoke with Davy Pique and she stated she received the letter she needed and no further questions at this time.

## 2018-02-10 NOTE — Telephone Encounter (Signed)
Follow up   Evan Dorsey is calling to find if able to stop for 3 days per the previous clearance 01/26/2018. Please advise.

## 2018-02-25 DIAGNOSIS — K5730 Diverticulosis of large intestine without perforation or abscess without bleeding: Secondary | ICD-10-CM | POA: Diagnosis not present

## 2018-02-25 DIAGNOSIS — Z79899 Other long term (current) drug therapy: Secondary | ICD-10-CM | POA: Diagnosis not present

## 2018-02-25 DIAGNOSIS — N132 Hydronephrosis with renal and ureteral calculous obstruction: Secondary | ICD-10-CM | POA: Diagnosis not present

## 2018-02-25 DIAGNOSIS — K4090 Unilateral inguinal hernia, without obstruction or gangrene, not specified as recurrent: Secondary | ICD-10-CM | POA: Diagnosis not present

## 2018-02-25 DIAGNOSIS — Z7901 Long term (current) use of anticoagulants: Secondary | ICD-10-CM | POA: Diagnosis not present

## 2018-02-25 DIAGNOSIS — I700 Atherosclerosis of aorta: Secondary | ICD-10-CM | POA: Diagnosis not present

## 2018-03-01 ENCOUNTER — Other Ambulatory Visit: Payer: Self-pay | Admitting: Cardiology

## 2018-03-01 DIAGNOSIS — C61 Malignant neoplasm of prostate: Secondary | ICD-10-CM | POA: Diagnosis not present

## 2018-03-07 ENCOUNTER — Other Ambulatory Visit: Payer: Self-pay | Admitting: Internal Medicine

## 2018-03-07 ENCOUNTER — Inpatient Hospital Stay (HOSPITAL_COMMUNITY)
Admission: EM | Admit: 2018-03-07 | Discharge: 2018-03-10 | DRG: 920 | Disposition: A | Discharge status: Home / Self Care | Payer: Medicare Other | Source: Ambulatory Visit | Attending: Internal Medicine | Admitting: Internal Medicine

## 2018-03-07 ENCOUNTER — Other Ambulatory Visit: Payer: Self-pay

## 2018-03-07 ENCOUNTER — Encounter (HOSPITAL_COMMUNITY): Payer: Self-pay | Admitting: Emergency Medicine

## 2018-03-07 DIAGNOSIS — E785 Hyperlipidemia, unspecified: Secondary | ICD-10-CM | POA: Diagnosis present

## 2018-03-07 DIAGNOSIS — Z8249 Family history of ischemic heart disease and other diseases of the circulatory system: Secondary | ICD-10-CM

## 2018-03-07 DIAGNOSIS — K920 Hematemesis: Secondary | ICD-10-CM | POA: Diagnosis not present

## 2018-03-07 DIAGNOSIS — I4891 Unspecified atrial fibrillation: Secondary | ICD-10-CM | POA: Diagnosis not present

## 2018-03-07 DIAGNOSIS — R42 Dizziness and giddiness: Secondary | ICD-10-CM

## 2018-03-07 DIAGNOSIS — K625 Hemorrhage of anus and rectum: Secondary | ICD-10-CM | POA: Diagnosis not present

## 2018-03-07 DIAGNOSIS — D62 Acute posthemorrhagic anemia: Secondary | ICD-10-CM | POA: Diagnosis present

## 2018-03-07 DIAGNOSIS — R55 Syncope and collapse: Secondary | ICD-10-CM

## 2018-03-07 DIAGNOSIS — N99820 Postprocedural hemorrhage and hematoma of a genitourinary system organ or structure following a genitourinary system procedure: Principal | ICD-10-CM | POA: Diagnosis present

## 2018-03-07 DIAGNOSIS — N421 Congestion and hemorrhage of prostate: Secondary | ICD-10-CM | POA: Diagnosis not present

## 2018-03-07 DIAGNOSIS — C61 Malignant neoplasm of prostate: Secondary | ICD-10-CM | POA: Diagnosis not present

## 2018-03-07 DIAGNOSIS — I2510 Atherosclerotic heart disease of native coronary artery without angina pectoris: Secondary | ICD-10-CM | POA: Diagnosis present

## 2018-03-07 DIAGNOSIS — Y848 Other medical procedures as the cause of abnormal reaction of the patient, or of later complication, without mention of misadventure at the time of the procedure: Secondary | ICD-10-CM | POA: Diagnosis present

## 2018-03-07 DIAGNOSIS — Z96612 Presence of left artificial shoulder joint: Secondary | ICD-10-CM | POA: Diagnosis present

## 2018-03-07 DIAGNOSIS — I10 Essential (primary) hypertension: Secondary | ICD-10-CM | POA: Diagnosis present

## 2018-03-07 DIAGNOSIS — Z86010 Personal history of colonic polyps: Secondary | ICD-10-CM | POA: Diagnosis not present

## 2018-03-07 DIAGNOSIS — E7800 Pure hypercholesterolemia, unspecified: Secondary | ICD-10-CM | POA: Diagnosis present

## 2018-03-07 DIAGNOSIS — K219 Gastro-esophageal reflux disease without esophagitis: Secondary | ICD-10-CM | POA: Diagnosis present

## 2018-03-07 DIAGNOSIS — Z7901 Long term (current) use of anticoagulants: Secondary | ICD-10-CM | POA: Diagnosis not present

## 2018-03-07 DIAGNOSIS — Z8551 Personal history of malignant neoplasm of bladder: Secondary | ICD-10-CM | POA: Diagnosis not present

## 2018-03-07 DIAGNOSIS — Z951 Presence of aortocoronary bypass graft: Secondary | ICD-10-CM

## 2018-03-07 DIAGNOSIS — Z87442 Personal history of urinary calculi: Secondary | ICD-10-CM

## 2018-03-07 DIAGNOSIS — I252 Old myocardial infarction: Secondary | ICD-10-CM | POA: Diagnosis not present

## 2018-03-07 LAB — COMPREHENSIVE METABOLIC PANEL
ALBUMIN: 3.4 g/dL — AB (ref 3.5–5.0)
ALT: 46 U/L — AB (ref 0–44)
AST: 46 U/L — AB (ref 15–41)
Alkaline Phosphatase: 49 U/L (ref 38–126)
Anion gap: 10 (ref 5–15)
BUN: 17 mg/dL (ref 8–23)
CHLORIDE: 107 mmol/L (ref 98–111)
CO2: 26 mmol/L (ref 22–32)
Calcium: 9.6 mg/dL (ref 8.9–10.3)
Creatinine, Ser: 1.32 mg/dL — ABNORMAL HIGH (ref 0.61–1.24)
GFR calc Af Amer: 56 mL/min — ABNORMAL LOW (ref >60)
GFR calc non Af Amer: 48 mL/min — ABNORMAL LOW (ref >60)
GLUCOSE: 122 mg/dL — AB (ref 70–99)
POTASSIUM: 3.8 mmol/L (ref 3.5–5.1)
Sodium: 143 mmol/L (ref 135–145)
Total Bilirubin: 0.9 mg/dL (ref 0.3–1.2)
Total Protein: 6.7 g/dL (ref 6.5–8.1)

## 2018-03-07 LAB — I-STAT TROPONIN, ED: Troponin i, poc: 0 ng/mL (ref 0.00–0.08)

## 2018-03-07 LAB — CBC
HCT: 38.6 % — ABNORMAL LOW (ref 39.0–52.0)
Hemoglobin: 11.7 g/dL — ABNORMAL LOW (ref 13.0–17.0)
MCH: 27.2 pg (ref 26.0–34.0)
MCHC: 30.3 g/dL (ref 30.0–36.0)
MCV: 89.8 fL (ref 78.0–100.0)
Platelets: 355 10*3/uL (ref 150–400)
RBC: 4.3 MIL/uL (ref 4.22–5.81)
RDW: 15.9 % — ABNORMAL HIGH (ref 11.5–15.5)
WBC: 10.2 10*3/uL (ref 4.0–10.5)

## 2018-03-07 LAB — POC OCCULT BLOOD, ED: Fecal Occult Bld: POSITIVE — AB

## 2018-03-07 MED ORDER — ACETAMINOPHEN 500 MG PO TABS
500.0000 mg | ORAL_TABLET | Freq: Four times a day (QID) | ORAL | Status: DC | PRN
  Administered 2018-03-08 – 2018-03-09 (×3): 500 mg via ORAL
  Filled 2018-03-07 (×3): qty 1

## 2018-03-07 MED ORDER — SODIUM CHLORIDE 0.9 % IV SOLN
INTRAVENOUS | Status: DC
  Administered 2018-03-08 – 2018-03-09 (×4): via INTRAVENOUS

## 2018-03-07 MED ORDER — AMLODIPINE BESYLATE 5 MG PO TABS
5.0000 mg | ORAL_TABLET | Freq: Every day | ORAL | Status: DC
  Administered 2018-03-08 – 2018-03-10 (×3): 5 mg via ORAL
  Filled 2018-03-07 (×3): qty 1

## 2018-03-07 MED ORDER — AMIODARONE HCL 200 MG PO TABS
200.0000 mg | ORAL_TABLET | Freq: Every day | ORAL | Status: DC
  Administered 2018-03-08 – 2018-03-10 (×3): 200 mg via ORAL
  Filled 2018-03-07 (×3): qty 1

## 2018-03-07 MED ORDER — TRIAMCINOLONE ACETONIDE 0.1 % EX CREA
1.0000 "application " | TOPICAL_CREAM | Freq: Two times a day (BID) | CUTANEOUS | Status: DC
  Administered 2018-03-09: 1 via TOPICAL
  Filled 2018-03-07: qty 15

## 2018-03-07 MED ORDER — ACETAMINOPHEN 500 MG PO TABS: 500.0000 mg | ORAL_TABLET | Freq: Four times a day (QID) | ORAL | Status: DC | PRN

## 2018-03-07 MED ORDER — ONDANSETRON HCL 4 MG PO TABS: 4.0000 mg | ORAL_TABLET | Freq: Four times a day (QID) | ORAL | Status: DC | PRN

## 2018-03-07 MED ORDER — MIRABEGRON ER 25 MG PO TB24
50.0000 mg | ORAL_TABLET | Freq: Every day | ORAL | Status: DC
  Administered 2018-03-08 – 2018-03-10 (×3): 50 mg via ORAL
  Filled 2018-03-07 (×3): qty 2

## 2018-03-07 MED ORDER — ONDANSETRON HCL 4 MG/2ML IJ SOLN: 4.0000 mg | Freq: Four times a day (QID) | INTRAMUSCULAR | Status: DC | PRN

## 2018-03-07 MED ORDER — BISACODYL 5 MG PO TBEC: 10.0000 mg | DELAYED_RELEASE_TABLET | Freq: Every day | ORAL | Status: DC | PRN

## 2018-03-07 MED ORDER — NITROGLYCERIN 0.4 MG SL SUBL: 0.4000 mg | SUBLINGUAL_TABLET | SUBLINGUAL | Status: DC | PRN

## 2018-03-07 MED ORDER — CALCIUM CARBONATE ANTACID 500 MG PO CHEW
2.0000 | CHEWABLE_TABLET | Freq: Every day | ORAL | Status: DC
  Administered 2018-03-08 – 2018-03-10 (×3): 400 mg via ORAL
  Filled 2018-03-07 (×3): qty 2

## 2018-03-07 MED ORDER — ROSUVASTATIN CALCIUM 20 MG PO TABS
40.0000 mg | ORAL_TABLET | Freq: Every day | ORAL | Status: DC
  Administered 2018-03-08 – 2018-03-10 (×3): 40 mg via ORAL
  Filled 2018-03-07 (×4): qty 2

## 2018-03-07 MED ORDER — SODIUM CHLORIDE 0.9 % IV BOLUS
1000.0000 mL | Freq: Once | INTRAVENOUS | Status: AC
  Administered 2018-03-07: 1000 mL via INTRAVENOUS

## 2018-03-07 MED ORDER — VITAMIN D3 25 MCG (1000 UNIT) PO TABS
1000.0000 [IU] | ORAL_TABLET | Freq: Every day | ORAL | Status: DC
  Administered 2018-03-08 – 2018-03-10 (×3): 1000 [IU] via ORAL
  Filled 2018-03-07 (×3): qty 1

## 2018-03-07 NOTE — ED Notes (Signed)
ED TO INPATIENT HANDOFF REPORT  Name/Age/Gender Evan Dorsey 84 y.o. male  Code Status Code Status History    Date Active Date Inactive Code Status Order ID Comments User Context   07/07/2012 0223 07/08/2012 2154 Full Code [id number]  Lutricia Feil, RN Inpatient      Home/SNF/Other Home  Chief Complaint rectal bleeding  Level of Care/Admitting Diagnosis ED Disposition    ED Disposition Condition [facility] Area: [facility] [[id number]]  Level of Care: Telemetry [5]  Admit to tele based on following criteria: Eval of Syncope  Diagnosis: Postural dizziness with presyncope [[id number]]  Admitting Physician: Elwyn Reach [[id number]]  Attending Physician: Elwyn Reach [[id number]]  Estimated length of stay: past midnight tomorrow  Certification:: I certify this patient will need inpatient services for at least 2 midnights  PT Class (Do Not Modify): Inpatient [101]  PT Acc Code (Do Not Modify): Private [1]       Medical History Past Medical History:  Diagnosis Date  . Adenomatous polyp   . Arthritis    "left shoulder" (03/02/2013)  . Atrial fibrillation ([place])   . Bladder cancer ([place]) 07/31/2011  . CAD (coronary artery disease)    a. remote CABG b. prior cardiac cath 2005 c. Lexiscan Myoview 07/08/12 w/o evidence of ischemia, EF 53%  . Diverticulosis   . Elevated PSA   . Frequent urination    "day and night" (03/02/2013)  . GERD (gastroesophageal reflux disease)   . Gout    "been a long time ago" (03/02/2013)  . History of kidney stones   . HLD (hyperlipidemia)   . HTN (hypertension)   . Myocardial infarction [facility]) 1990's   silent    Allergies No Known Allergies  IV Location/Drains/Wounds Patient Lines/Drains/Airways Status   Active Line/Drains/Airways    Name:   Placement date:   Placement time:   Site:   Days:   Peripheral IV 03/07/18 Right Antecubital   03/07/18    1640    Antecubital   less than 1   Ureteral Drain/Stent  Right ureter 6 Fr.   11/06/16    1002    Right ureter   486   Incision 07/31/11 Perineum Other (Comment)   07/31/11    0957     2411   Incision 03/02/13 Shoulder Left   03/02/13    0923     1831   Incision (Closed) 11/06/16 Perineum Other (Comment)   11/06/16    0815     486          Labs/Imaging Results for orders placed or performed during the hospital encounter of 03/07/18 (from the past 48 hour(s))  Comprehensive metabolic panel     Status: Abnormal   Collection Time: 03/07/18  3:16 PM  Result Value Ref Range   Sodium 143 135 - 145 mmol/L   Potassium 3.8 3.5 - 5.1 mmol/L   Chloride 107 98 - 111 mmol/L   CO2 26 22 - 32 mmol/L   Glucose, Bld 122 (H) 70 - 99 mg/dL   BUN 17 8 - 23 mg/dL   Creatinine, Ser 1.32 (H) 0.61 - 1.24 mg/dL   Calcium 9.6 8.9 - 10.3 mg/dL   Total Protein 6.7 6.5 - 8.1 g/dL   Albumin 3.4 (L) 3.5 - 5.0 g/dL   AST 46 (H) 15 - 41 U/L   ALT 46 (H) 0 - 44 U/L   Alkaline Phosphatase 49 38 - 126 U/L   Total  Bilirubin 0.9 0.3 - 1.2 mg/dL   GFR calc non Af Amer 48 (L) >60 mL/min   GFR calc Af Amer 56 (L) >60 mL/min    Comment: (NOTE) The eGFR has been calculated using the CKD EPI equation. This calculation has not been validated in all clinical situations. eGFR's persistently <60 mL/min signify possible Chronic Kidney Disease.    Anion gap 10 5 - 15    Comment: Performed at [facility], [place] [address] [place], [place] [address]  CBC     Status: Abnormal   Collection Time: 03/07/18  3:16 PM  Result Value Ref Range   WBC 10.2 4.0 - 10.5 K/uL   RBC 4.30 4.22 - 5.81 MIL/uL   Hemoglobin 11.7 (L) 13.0 - 17.0 g/dL   HCT 38.6 (L) 39.0 - 52.0 %   MCV 89.8 78.0 - 100.0 fL   MCH 27.2 26.0 - 34.0 pg   MCHC 30.3 30.0 - 36.0 g/dL   RDW 15.9 (H) 11.5 - 15.5 %   Platelets 355 150 - 400 K/uL    Comment: Performed at [facility], [place] [address], [place], [place] [address]  Type and screen [place]     Status:  None (Preliminary result)   Collection Time: 03/07/18  3:16 PM  Result Value Ref Range   ABO/RH(D) O POS    Antibody Screen PENDING    Sample Expiration      03/10/2018 Performed at [facility], [place] [address], [place], [place] [address]   I-Stat Troponin, ED (not at [facility])     Status: None   Collection Time: 03/07/18  4:32 PM  Result Value Ref Range   Troponin i, poc 0.00 0.00 - 0.08 ng/mL   Comment 3            Comment: Due to the release kinetics of cTnI, a negative result within the first hours of the onset of symptoms does not rule out myocardial infarction with certainty. If myocardial infarction is still suspected, repeat the test at appropriate intervals.   POC occult blood, ED     Status: Abnormal   Collection Time: 03/07/18  4:52 PM  Result Value Ref Range   Fecal Occult Bld POSITIVE (A) NEGATIVE   No results found.  Pending Labs Unresulted Labs (From admission, onward)    Start     Ordered   03/07/18 1631  ABO/Rh  Once,   R     03/07/18 1631          Vitals/Pain Today's Vitals   03/07/18 1700 03/07/18 1730 03/07/18 1800 03/07/18 1830  BP: 125/68 124/67 112/67 122/60  Pulse: (!) 59 (!) 58 (!) 58 (!) 58  Resp: 14 15 (!) 21 18  Temp:      TempSrc:      SpO2: 96% 92% 99% 99%  Weight:      Height:        Isolation Precautions No active isolations  Medications Medications  sodium chloride 0.9 % bolus 1,000 mL (1,000 mLs Intravenous New Bag/Given 03/07/18 1753)    Mobility walks with person assist

## 2018-03-07 NOTE — ED Notes (Addendum)
This writer attempted report x 2 to 5w, was told at 1910 to call back. I was told at [place] [facility] that the nurse written in the chart was not receiving that patient. Reported off to [facility] RN ED.

## 2018-03-07 NOTE — ED Provider Notes (Signed)
Medical screening examination/treatment/procedure(s) were conducted as a shared visit with non-physician practitioner(s) and myself.  I personally evaluated the patient during the encounter.  None 84 year old male who presents with bright red blood per rectum.  Patient currently takes Eliquis.  Slight abdominal cramping.  Patient felt like he was going to pass out but did not.  Patient's hemoglobin is stable at this time.  Patient to be admitted to the hospital   Lacretia Leigh, MD 03/07/18 1815

## 2018-03-07 NOTE — ED Provider Notes (Signed)
[facility] DEPT Provider Note   CSN: [id number] Arrival date & time: 03/07/18  1445     History   Chief Complaint Chief Complaint  Patient presents with  . Rectal Bleeding    HPI Evan Dorsey is a 84 y.o. male is here for evaluation of rectal bleeding onset 6 AM today.  Reports large quantity of dark red/maroon blood with blood clots out of rectum.  Associated with "feeling crummy" and presyncope.  Patient went to PCP and felt like he was going to pass out , instructed to come to the ER.  He has history of CAD status post CABG, atrial fibrillation on amiodarone and Eliquis, hypertension, kidney stones.  He had a recent prostate biopsy last week.  He restarted taking his Eliquis 5 days ago.  He denies any fever, cough, chest pain, shortness of breath, abdominal pain.  No bowel movements today.  He had hematuria after the biopsy but this resolved.    HPI  Past Medical History:  Diagnosis Date  . Adenomatous polyp   . Arthritis    "left shoulder" (03/02/2013)  . Atrial fibrillation ([place])   . Bladder cancer ([place]) 07/31/2011  . CAD (coronary artery disease)    a. remote CABG b. prior cardiac cath 2005 c. Lexiscan Myoview 07/08/12 w/o evidence of ischemia, EF 53%  . Diverticulosis   . Elevated PSA   . Frequent urination    "day and night" (03/02/2013)  . GERD (gastroesophageal reflux disease)   . Gout    "been a long time ago" (03/02/2013)  . History of kidney stones   . HLD (hyperlipidemia)   . HTN (hypertension)   . Myocardial infarction [facility]) 1990's   silent    Patient Active Problem List   Diagnosis Date Noted  . Preop cardiovascular exam 02/10/2013  . Paroxysmal atrial fibrillation ([place]) 07/09/2012  . Hypokalemia 07/09/2012  . Precordial pain 07/07/2012  . Bladder cancer ([place]) 07/31/2011  . Hematuria 04/22/2011  . Atrial fibrillation ([place]) 08/29/2010  . HYPERCHOLESTEROLEMIA 02/25/2010  . HYPERLIPIDEMIA-MIXED 02/25/2010  .  HYPERTENSION, BENIGN 02/25/2010  . CORONARY ATHEROSCLEROSIS NATIVE CORONARY ARTERY 02/25/2010  . CAD, ARTERY BYPASS GRAFT 02/25/2010    Past Surgical History:  Procedure Laterality Date  . CARDIAC CATHETERIZATION     total occlusion to LAD, LCX, RCA. LIMA patent, normal seq SVG to OM, severe stenosis in SVG to PDA, but collaterals to PLA from OM. RCA went to PDA. treated medically   . CARDIOVASCULAR STRESS TEST  02/10/08   6 mets, poor tolerance, no chest pain   . CATARACT EXTRACTION W/ INTRAOCULAR LENS IMPLANT Right 2014  . CORONARY ARTERY BYPASS GRAFT  08/1991   "CABG X4" (03/02/2013)  . CYSTOSCOPY W/ STONE MANIPULATION    . ORIF ACETABULAR FRACTURE Left 04/2007   trimalleolar fracture  . TOTAL SHOULDER ARTHROPLASTY Left 03/02/2013  . TOTAL SHOULDER ARTHROPLASTY Left 03/02/2013   Procedure: LEFT TOTAL SHOULDER ARTHROPLASTY;  Surgeon: Marin Shutter, MD;  Location: [place];  Service: Orthopedics;  Laterality: Left;  . TRANSURETHRAL RESECTION OF BLADDER TUMOR  07/31/2011   Procedure: TRANSURETHRAL RESECTION OF BLADDER TUMOR (TURBT);  Surgeon: Claybon Jabs, MD;  Location: [facility];  Service: Urology;  Laterality: N/A;  with [place]  . URETEROSCOPY WITH HOLMIUM LASER LITHOTRIPSY Right 11/06/2016   Procedure: URETEROSCOPY WITH HOLMIUM LASER LITHOTRIPSY/ RETROGRADE PYELOGRAM/ STENT;  Surgeon: Kathie Rhodes, MD;  Location: [facility];  Service: Urology;  Laterality: Right;  Home Medications    Prior to Admission medications   Medication Sig Start Date End Date Taking? Authorizing Provider  acetaminophen (TYLENOL) 500 MG tablet Take 500 mg by mouth every 6 (six) hours as needed.    [provider]  amiodarone (PACERONE) 200 MG tablet TAKE ONE TABLET BY MOUTH DAILY 03/02/18   Lelon Perla, MD  amLODipine (NORVASC) 5 MG tablet TAKE ONE TABLET BY MOUTH EVERY DAY 12/30/17   Lelon Perla, MD  apixaban (ELIQUIS) 5 MG TABS tablet Take 1 tablet (5 mg total) by mouth  2 (two) times daily. 01/20/18   Lelon Perla, MD  calcium carbonate (TUMS - DOSED IN MG ELEMENTAL CALCIUM) 500 MG chewable tablet Chew 2 tablets by mouth daily.    [provider]  cholecalciferol (VITAMIN D) 1000 units tablet Take 1,000 Units by mouth daily.    [provider]  Methylcellulose, Laxative, 500 MG TABS Take 2 tablets by mouth 2 (two) times daily.     [provider]  MYRBETRIQ 50 MG TB24 tablet Take 50 mg by mouth daily. 10/20/17   [provider]  NITROSTAT 0.4 MG SL tablet PLACE 1 TABLET UNDER THE TONGUE EVERY 5 MINUTES IF NEEDED FOR CHEST PAIN. MAX = 3 TABS/EPISODE. 02/27/15   Josue Hector, MD  rosuvastatin (CRESTOR) 20 MG tablet Take 20 mg by mouth daily.     [provider]    Family History Family History  Problem Relation Age of Onset  . CAD Father 26  . CAD Brother 76  . CAD Brother 13    Social History Social History   Tobacco Use  . Smoking status: Never Smoker  . Smokeless tobacco: Never Used  Substance Use Topics  . Alcohol use: No  . Drug use: No     Allergies   Patient has no known allergies.   Review of Systems Review of Systems  Constitutional:       Generalized malaise  Gastrointestinal: Positive for anal bleeding.  Neurological: Positive for light-headedness.  Hematological: Bruises/bleeds easily.  All other systems reviewed and are negative.    Physical Exam Updated Vital Signs BP 124/67   Pulse (!) 58   Temp 97.6 F (36.4 C) (Oral)   Resp 15   Ht 5\' 11"  (1.803 m)   Wt 106.6 kg   SpO2 92%   BMI 32.78 kg/m   Physical Exam  Constitutional: He is oriented to person, place, and time. He appears well-developed and well-nourished.  Non toxic. No distress.   HENT:  Head: Normocephalic and atraumatic.  Nose: Nose normal.  MMM  Eyes: Pupils are equal, round, and reactive to light. Conjunctivae and EOM are normal.  Neck: Normal range of motion.  Cardiovascular: Normal rate,  regular rhythm and normal heart sounds.  2+ radial and DP pulses bilaterally. Trace pitting edema to tib/fibs symmetric.   Pulmonary/Chest: Effort normal and breath sounds normal.  Abdominal: Soft. Bowel sounds are normal. There is no tenderness.  No G/R/R. No suprapubic or CVA tenderness. Negative Murphy's and McBurney's  Genitourinary: Rectal exam shows guaiac positive stool.  Genitourinary Comments: Exam performed with EMT assistance. Dried up dark blood in perianal region.  One small blood clot noted.  BRB oozing out of anus with internal exam quickly stops with wipe/pressure. No arterial or pulsatile bleeding.  No hemorrhoids or fissures noted. Good rectal tone.   Musculoskeletal: Normal range of motion.  Neurological: He is alert and oriented to person, place, and time.  Skin: Skin is warm and dry. Capillary refill takes less than 2 seconds.  Psychiatric: He has a normal mood and affect. His behavior is normal. Judgment and thought content normal.  Nursing note and vitals reviewed.    ED Treatments / Results  Labs (all labs ordered are listed, but only abnormal results are displayed) Labs Reviewed  COMPREHENSIVE METABOLIC PANEL - Abnormal; Notable for the following components:      Result Value   Glucose, Bld 122 (*)    Creatinine, Ser 1.32 (*)    Albumin 3.4 (*)    AST 46 (*)    ALT 46 (*)    GFR calc non Af Amer 48 (*)    GFR calc Af Amer 56 (*)    All other components within normal limits  CBC - Abnormal; Notable for the following components:   Hemoglobin 11.7 (*)    HCT 38.6 (*)    RDW 15.9 (*)    All other components within normal limits  POC OCCULT BLOOD, ED - Abnormal; Notable for the following components:   Fecal Occult Bld POSITIVE (*)    All other components within normal limits  I-STAT TROPONIN, ED  POC OCCULT BLOOD, ED  TYPE AND SCREEN  ABO/RH    EKG EKG Interpretation  Date/Time:  Monday March 07 2018 16:33:35 EDT Ventricular Rate:  59 PR  Interval:    QRS Duration: 182 QT Interval:  599 QTC Calculation: 594 R Axis:   119 Text Interpretation:  Sinus rhythm RBBB and LPFB Baseline wander in lead(s) II III aVF Confirmed by Lacretia Leigh ([id number]) on 03/07/2018 6:14:45 PM   Radiology No results found.  Procedures Procedures (including critical care time)  Medications Ordered in ED Medications  sodium chloride 0.9 % bolus 1,000 mL (1,000 mLs Intravenous New Bag/Given 03/07/18 1753)     Initial Impression / Assessment and Plan / ED Course  I have reviewed the triage vital signs and the nursing notes.  Pertinent labs & imaging results that were available during my care of the patient were reviewed by me and considered in my medical decision making (see chart for details).  Clinical Course as of Mar 07 1818  Mon Mar 07, 2018  1752 Hemoglobin(!): 11.7 [CG]  1752 Fecal Occult Blood, POC(!): POSITIVE [CG]  1752 Creatinine(!): 1.32 [CG]    Clinical Course User Index [CG] Kinnie Feil, PA-C   84 year old on Eliquis is here with perfuse rectal bright red bleeding at 6 AM today.  In setting of recent prostate biopsy last Tuesday by Dr. Kathie Rhodes.  Had presyncopal episode PTA.  Exam without active bleed however on internal exam he does have bright red blood oozing out still.  Hemoglobin 11.7.  Creatinine 1.32.  EKG without ischemic changes.  Troponin is negative.  Patient feels generalized weakness.  I discussed patient with hospitalist who accepts patient for observation.  Patient and wife in agreement.  Final Clinical Impressions(s) / ED Diagnoses   Final diagnoses:  Rectal bleeding    ED Discharge Orders    None       Arlean Hopping 03/07/18 1819    Lacretia Leigh, MD 03/07/18 [phone]

## 2018-03-07 NOTE — H&P (Signed)
History and Physical   Evan Dorsey OIZ:[id number] DOB: Aug 01, 1932 DOA: 03/07/2018  Referring MD/NP/PA: Dr. Vivi Martens  PCP: Deland Pretty, MD   Outpatient Specialists: Dr. [name] cardiologist, Dr. Kathie Rhodes, urology  Patient coming from: Home  Chief Complaint: Rectal bleeding  HPI: Evan Dorsey is a 84 y.o. male with medical history significant of coronary artery disease status post coronary bypass grafting, atrial fibrillation on Eliquis, hypertension history of kidney stone and GERD who apparently had prostate biopsy last week by Dr. Karsten Ro.  He stopped his Eliquis 2 days prior to the procedure and resume 1 day after.  3 days later this morning patient started having rectal bleed.  Patient reported a large amount of red-maroon blood in his stools.  He started feeling lightheaded and sweaty.  Patient almost passed out.  He therefore came to the ER where he was seen and evaluated.  In the ER patient has bloodstained stool which is guaiac positive.  His vitals are stable hemoglobin remained around 11 g.  He is being admitted therefore with rectal bleed for work-up.  ED Course: Vitals include temperature 98.1 blood pressure 110/63 pulse 58 respiratory rate of 99 oxygen sat 92% room air.  White count is 10.2 hemoglobin 11.7 and platelet 355.  Creatinine 1.32 and glucose 122.  BUN is 17.  Review of Systems: As per HPI otherwise 10 point review of systems negative.    Past Medical History:  Diagnosis Date  . Adenomatous polyp   . Arthritis    "left shoulder" (03/02/2013)  . Atrial fibrillation ([place])   . Bladder cancer ([place]) 07/31/2011  . CAD (coronary artery disease)    a. remote CABG b. prior cardiac cath 2005 c. Lexiscan Myoview 07/08/12 w/o evidence of ischemia, EF 53%  . Diverticulosis   . Elevated PSA   . Frequent urination    "day and night" (03/02/2013)  . GERD (gastroesophageal reflux disease)   . Gout    "been a long time ago" (03/02/2013)  . History of  kidney stones   . HLD (hyperlipidemia)   . HTN (hypertension)   . Myocardial infarction [facility]) 1990's   silent    Past Surgical History:  Procedure Laterality Date  . CARDIAC CATHETERIZATION     total occlusion to LAD, LCX, RCA. LIMA patent, normal seq SVG to OM, severe stenosis in SVG to PDA, but collaterals to PLA from OM. RCA went to PDA. treated medically   . CARDIOVASCULAR STRESS TEST  02/10/08   6 mets, poor tolerance, no chest pain   . CATARACT EXTRACTION W/ INTRAOCULAR LENS IMPLANT Right 2014  . CORONARY ARTERY BYPASS GRAFT  08/1991   "CABG X4" (03/02/2013)  . CYSTOSCOPY W/ STONE MANIPULATION    . ORIF ACETABULAR FRACTURE Left 04/2007   trimalleolar fracture  . TOTAL SHOULDER ARTHROPLASTY Left 03/02/2013  . TOTAL SHOULDER ARTHROPLASTY Left 03/02/2013   Procedure: LEFT TOTAL SHOULDER ARTHROPLASTY;  Surgeon: Marin Shutter, MD;  Location: [facility];  Service: Orthopedics;  Laterality: Left;  . TRANSURETHRAL RESECTION OF BLADDER TUMOR  07/31/2011   Procedure: TRANSURETHRAL RESECTION OF BLADDER TUMOR (TURBT);  Surgeon: Claybon Jabs, MD;  Location: [facility];  Service: Urology;  Laterality: N/A;  with [place]  . URETEROSCOPY WITH HOLMIUM LASER LITHOTRIPSY Right 11/06/2016   Procedure: URETEROSCOPY WITH HOLMIUM LASER LITHOTRIPSY/ RETROGRADE PYELOGRAM/ STENT;  Surgeon: Kathie Rhodes, MD;  Location: [facility];  Service: Urology;  Laterality: Right;     reports that he has never  smoked. He has never used smokeless tobacco. He reports that he does not drink alcohol or use drugs.  No Known Allergies  Family History  Problem Relation Age of Onset  . CAD Father 43  . CAD Brother 44  . CAD Brother 2     Prior to Admission medications   Medication Sig Start Date End Date Taking? Authorizing Provider  acetaminophen (TYLENOL) 500 MG tablet Take 500 mg by mouth every 6 (six) hours as needed.   Yes [provider]  amiodarone (PACERONE) 200 MG tablet TAKE ONE TABLET BY  MOUTH DAILY 03/02/18  Yes Stanford Breed, Denice Bors, MD  amLODipine (NORVASC) 5 MG tablet TAKE ONE TABLET BY MOUTH EVERY DAY 12/30/17  Yes Crenshaw, Denice Bors, MD  apixaban (ELIQUIS) 5 MG TABS tablet Take 1 tablet (5 mg total) by mouth 2 (two) times daily. 01/20/18  Yes Lelon Perla, MD  bisacodyl (DULCOLAX) 5 MG EC tablet Take 10 mg by mouth daily as needed for moderate constipation.   Yes [provider]  calcium carbonate (TUMS - DOSED IN MG ELEMENTAL CALCIUM) 500 MG chewable tablet Chew 2 tablets by mouth daily.   Yes [provider]  MYRBETRIQ 50 MG TB24 tablet Take 50 mg by mouth daily. 10/20/17  Yes [provider]  rosuvastatin (CRESTOR) 40 MG tablet Take 40 mg by mouth daily. 02/09/18  Yes [provider]  triamcinolone cream (KENALOG) 0.1 % Apply 1 application topically 2 (two) times daily. 12/17/17  Yes [provider]  cholecalciferol (VITAMIN D) 1000 units tablet Take 1,000 Units by mouth daily.    [provider]  NITROSTAT 0.4 MG SL tablet PLACE 1 TABLET UNDER THE TONGUE EVERY 5 MINUTES IF NEEDED FOR CHEST PAIN. MAX = 3 TABS/EPISODE. 02/27/15   Josue Hector, MD    Physical Exam: Vitals:   03/07/18 1900 03/07/18 1930 03/07/18 2007 03/07/18 2012  BP: 127/69 120/69 (!) 159/82   Pulse: (!) 58 (!) 58 69   Resp: 15 12 20    Temp:   98.1 F (36.7 C)   TempSrc:   Oral   SpO2: 98% 99% 99%   Weight:    102.9 kg  Height:    5\' 11"  (1.803 m)      Constitutional: NAD, calm, comfortable Vitals:   03/07/18 1900 03/07/18 1930 03/07/18 2007 03/07/18 2012  BP: 127/69 120/69 (!) 159/82   Pulse: (!) 58 (!) 58 69   Resp: 15 12 20    Temp:   98.1 F (36.7 C)   TempSrc:   Oral   SpO2: 98% 99% 99%   Weight:    102.9 kg  Height:    5\' 11"  (1.803 m)   Eyes: PERRL, lids and conjunctivae normal ENMT: Mucous membranes are moist. Posterior pharynx clear of any exudate or lesions.Normal dentition.  Neck: normal, supple, no masses, no  thyromegaly Respiratory: clear to auscultation bilaterally, no wheezing, no crackles. Normal respiratory effort. No accessory muscle use.  Cardiovascular: Regular rate and rhythm, no murmurs / rubs / gallops. No extremity edema. 2+ pedal pulses. No carotid bruits.  Abdomen: no tenderness, no masses palpated. No hepatosplenomegaly. Bowel sounds positive.  Musculoskeletal: no clubbing / cyanosis. No joint deformity upper and lower extremities. Good ROM, no contractures. Normal muscle tone.  Skin: no rashes, lesions, ulcers. No induration Neurologic: CN 2-12 grossly intact. Sensation intact, DTR normal. Strength 5/5 in all 4.  Psychiatric: Normal judgment and insight. Alert and oriented x 3. Normal mood.  Labs on Admission: I have personally reviewed following labs and imaging studies  CBC: Recent Labs  Lab 03/07/18 1516  WBC 10.2  HGB 11.7*  HCT 38.6*  MCV 89.8  PLT 824   Basic Metabolic Panel: Recent Labs  Lab 03/07/18 1516  NA 143  K 3.8  CL 107  CO2 26  GLUCOSE 122*  BUN 17  CREATININE 1.32*  CALCIUM 9.6   GFR: Estimated Creatinine Clearance: 51.8 mL/min (A) (by C-G formula based on SCr of 1.32 mg/dL (H)). Liver Function Tests: Recent Labs  Lab 03/07/18 1516  AST 46*  ALT 46*  ALKPHOS 49  BILITOT 0.9  PROT 6.7  ALBUMIN 3.4*   No results for input(s): LIPASE, AMYLASE in the last 168 hours. No results for input(s): AMMONIA in the last 168 hours. Coagulation Profile: No results for input(s): INR, PROTIME in the last 168 hours. Cardiac Enzymes: No results for input(s): CKTOTAL, CKMB, CKMBINDEX, TROPONINI in the last 168 hours. BNP (last 3 results) No results for input(s): PROBNP in the last 8760 hours. HbA1C: No results for input(s): HGBA1C in the last 72 hours. CBG: No results for input(s): GLUCAP in the last 168 hours. Lipid Profile: No results for input(s): CHOL, HDL, LDLCALC, TRIG, CHOLHDL, LDLDIRECT in the last 72 hours. Thyroid Function Tests: No  results for input(s): TSH, T4TOTAL, FREET4, T3FREE, THYROIDAB in the last 72 hours. Anemia Panel: No results for input(s): VITAMINB12, FOLATE, FERRITIN, TIBC, IRON, RETICCTPCT in the last 72 hours. Urine analysis:    Component Value Date/Time   COLORURINE YELLOW 10/19/2016 1300   APPEARANCEUR HAZY (A) 10/19/2016 1300   LABSPEC 1.020 10/19/2016 1300   PHURINE 5.0 10/19/2016 1300   GLUCOSEU NEGATIVE 10/19/2016 1300   GLUCOSEU NEGATIVE 04/22/2011 1541   HGBUR LARGE (A) 10/19/2016 1300   BILIRUBINUR NEGATIVE 10/19/2016 1300   KETONESUR 5 (A) 10/19/2016 1300   PROTEINUR 30 (A) 10/19/2016 1300   UROBILINOGEN 0.2 04/22/2011 1541   NITRITE NEGATIVE 10/19/2016 1300   LEUKOCYTESUR TRACE (A) 10/19/2016 1300   Sepsis Labs: @LABRCNTIP (procalcitonin:4,lacticidven:4) )No results found for this or any previous visit (from the past 240 hour(s)).   Radiological Exams on Admission: No results found.  EKG: Independently reviewed.  Normal sinus rhythm with multiple conduction abnormalities but unchanged from previous  Assessment/Plan Principal Problem:   Postural dizziness with presyncope Active Problems:   HYPERCHOLESTEROLEMIA   HYPERTENSION, BENIGN   CORONARY ATHEROSCLEROSIS NATIVE CORONARY ARTERY   Atrial fibrillation (HCC)   Rectal bleeding     #1 presyncope: Secondary to GI bleed.  Rapid rectal bleed most likely the cause.  Slightly orthostatic.  Monitor patient closely.  IV fluids as well as serial CBCs.  Type and crossmatch on hold packed red blood cells.  If hemoglobin drops significantly we will transfuse.  #2 coronary artery disease: Patient will be admitted to a monitored bed.  No evidence of coronary artery decompensation.  #3 rectal bleed: Because of patient's presyncope.  H&H will be monitored closely.  We will consult Dr. Karsten Ro in the morning.  Patient may require GI consult as well.  At this point we have stopped the Eliquis and will monitor closely.  FFP may be given with  increasing bleed.  Further supportive measures will be given.  #4 hypertension: Blood pressure so far stable.  We will look cautiously continue some blood pressure medication but be careful due to active bleed soap blood pressure does not bottom out  #5 atrial fibrillation: Rate is controlled.  Patient is on amiodarone  which we will continue with.  We will hold Eliquis however   DVT prophylaxis: SCD Code Status: Full code Family Communication: No family in the room wife who is at home over the phone Disposition Plan: Home Consults called: Dr. Karsten Ro to be informed in the morning Admission status: Inpatient  Severity of Illness: The appropriate patient status for this patient is INPATIENT. Inpatient status is judged to be reasonable and necessary in order to provide the required intensity of service to ensure the patient's safety. The patient's presenting symptoms, physical exam findings, and initial radiographic and laboratory data in the context of their chronic comorbidities is felt to place them at high risk for further clinical deterioration. Furthermore, it is not anticipated that the patient will be medically stable for discharge from the hospital within 2 midnights of admission. The following factors support the patient status of inpatient.   " The patient's presenting symptoms include rectal bleed. " The worrisome physical exam findings include stable patient with no significant findings.  Except bloodstain stool " The initial radiographic and laboratory data are worrisome because of his stool guaiac positive. " The chronic co-morbidities include atrial fibrillation on chronic anticoagulation.   * I certify that at the point of admission it is my clinical judgment that the patient will require inpatient hospital care spanning beyond 2 midnights from the point of admission due to high intensity of service, high risk for further deterioration and high frequency of surveillance  required.Barbette Merino MD [facility] Pager [phone]  If 7PM-7AM, please contact night-coverage [email or web address] Password [facility]  03/07/2018, 8:36 PM

## 2018-03-07 NOTE — ED Triage Notes (Signed)
Pt having rectal bleeding since 6am today. Reports taking Eliquis. Had prostate biopsy last week so had stopped for few days.  PCP told pt to go to ED.

## 2018-03-08 LAB — CBC
HCT: 31.4 % — ABNORMAL LOW (ref 39.0–52.0)
HCT: 30 % — ABNORMAL LOW (ref 39.0–52.0)
HCT: 27.3 % — ABNORMAL LOW (ref 39.0–52.0)
HEMATOCRIT: 28.7 % — AB (ref 39.0–52.0)
HEMOGLOBIN: 9.6 g/dL — AB (ref 13.0–17.0)
Hemoglobin: 9.4 g/dL — ABNORMAL LOW (ref 13.0–17.0)
Hemoglobin: 8.9 g/dL — ABNORMAL LOW (ref 13.0–17.0)
Hemoglobin: 8.3 g/dL — ABNORMAL LOW (ref 13.0–17.0)
MCH: 27.8 pg (ref 26.0–34.0)
MCH: 28.1 pg (ref 26.0–34.0)
MCH: 27.9 pg (ref 26.0–34.0)
MCH: 27.6 pg (ref 26.0–34.0)
MCHC: 30.6 g/dL (ref 30.0–36.0)
MCHC: 31.3 g/dL (ref 30.0–36.0)
MCHC: 31 g/dL (ref 30.0–36.0)
MCHC: 30.4 g/dL (ref 30.0–36.0)
MCV: 91 fL (ref 78.0–100.0)
MCV: 89.8 fL (ref 78.0–100.0)
MCV: 90 fL (ref 78.0–100.0)
MCV: 90.7 fL (ref 78.0–100.0)
PLATELETS: 296 10*3/uL (ref 150–400)
Platelets: 289 10*3/uL (ref 150–400)
Platelets: 296 10*3/uL (ref 150–400)
Platelets: 256 10*3/uL (ref 150–400)
RBC: 3.45 MIL/uL — ABNORMAL LOW (ref 4.22–5.81)
RBC: 3.34 MIL/uL — AB (ref 4.22–5.81)
RBC: 3.19 MIL/uL — ABNORMAL LOW (ref 4.22–5.81)
RBC: 3.01 MIL/uL — AB (ref 4.22–5.81)
RDW: 16.2 % — ABNORMAL HIGH (ref 11.5–15.5)
RDW: 16 % — ABNORMAL HIGH (ref 11.5–15.5)
RDW: 16 % — AB (ref 11.5–15.5)
RDW: 16.1 % — ABNORMAL HIGH (ref 11.5–15.5)
WBC: 10.6 10*3/uL — ABNORMAL HIGH (ref 4.0–10.5)
WBC: 11.2 10*3/uL — ABNORMAL HIGH (ref 4.0–10.5)
WBC: 10.4 10*3/uL (ref 4.0–10.5)
WBC: 11.2 10*3/uL — ABNORMAL HIGH (ref 4.0–10.5)

## 2018-03-08 LAB — TYPE AND SCREEN
ABO/RH(D): O POS
Antibody Screen: NEGATIVE

## 2018-03-08 LAB — COMPREHENSIVE METABOLIC PANEL
ALBUMIN: 2.8 g/dL — AB (ref 3.5–5.0)
ALT: 35 U/L (ref 0–44)
ANION GAP: 9 (ref 5–15)
AST: 34 U/L (ref 15–41)
Alkaline Phosphatase: 34 U/L — ABNORMAL LOW (ref 38–126)
BUN: 17 mg/dL (ref 8–23)
CO2: 21 mmol/L — AB (ref 22–32)
Calcium: 8.4 mg/dL — ABNORMAL LOW (ref 8.9–10.3)
Chloride: 114 mmol/L — ABNORMAL HIGH (ref 98–111)
Creatinine, Ser: 1.12 mg/dL (ref 0.61–1.24)
GFR calc Af Amer: >60 mL/min (ref >60)
GFR calc non Af Amer: 59 mL/min — ABNORMAL LOW (ref >60)
GLUCOSE: 113 mg/dL — AB (ref 70–99)
POTASSIUM: 3.4 mmol/L — AB (ref 3.5–5.1)
SODIUM: 144 mmol/L (ref 135–145)
TOTAL PROTEIN: 5.3 g/dL — AB (ref 6.5–8.1)
Total Bilirubin: 0.5 mg/dL (ref 0.3–1.2)

## 2018-03-08 LAB — ABO/RH: ABO/RH(D): O POS

## 2018-03-08 NOTE — Progress Notes (Signed)
PROGRESS NOTE    Evan Dorsey  KYH:[id number] DOB: [birth year] DOA: 03/07/2018 PCP: Deland Pretty, MD   Brief Narrative: Evan Dorsey is a 84 y.o. male with medical history significant of coronary artery disease status post coronary bypass grafting, atrial fibrillation on Eliquis, hypertension history of kidney stone and GERD. He presented secondary to bright red blood per rectum. He had a recent prostate biopsy and is on Eliquis. Hemoglobin has dropped. He had some bleeding overnight, but none today.   Assessment & Plan:   Principal Problem:   Postural dizziness with presyncope Active Problems:   HYPERCHOLESTEROLEMIA   HYPERTENSION, BENIGN   CORONARY ATHEROSCLEROSIS NATIVE CORONARY ARTERY   Atrial fibrillation (HCC)   Rectal bleeding   Bright red blood per rectum Likely related to recent biopsy in addition to Eliquis use. Eliquis held. -Urology and GI consulted -CBC q6 hours  Presyncope/Dizziness Secondary to blood loss anemia. -telemetry  Blood loss anemia Secondary to GI bleeding likely from recent biopsy and Eliquis. -Management above  Essential hypertension Stable.  Atrial fibrillation Rate controlled. On amiodarone and Eliquis -Continue amiodarone and hold Eliquis as mentioned above   DVT prophylaxis: SCDs Code Status:   Code Status: Full Code Family Communication: None at bedside Disposition Plan: Discharge pending GI workup   Consultants:   [facility] gastroenterology  Urology  Procedures:   None  Antimicrobials:  None    Subjective: No bleeding this morning. Was hoping to leave today.  Objective: Vitals:   03/07/18 2012 03/08/18 0513 03/08/18 0922 03/08/18 1022  BP:  122/62 (!) 107/49 121/61  Pulse:  62 60 (!) 57  Resp:  18 18   Temp:  (!) 97.5 F (36.4 C) 97.6 F (36.4 C)   TempSrc:  Oral Oral   SpO2:  96%    Weight: 102.9 kg     Height: 5\' 11"  (1.803 m)       Intake/Output Summary (Last 24 hours) at 03/08/2018 1232 Last  data filed at 03/08/2018 0600 Gross per 24 hour  Intake 1577.31 ml  Output 100 ml  Net 1477.31 ml   Filed Weights   03/07/18 1504 03/07/18 2012  Weight: 106.6 kg 102.9 kg    Examination:  General exam: Appears calm and comfortable Respiratory system: Clear to auscultation. Respiratory effort normal. Cardiovascular system: S1 & S2 heard, No murmurs, rubs, gallops or clicks. Gastrointestinal system: Abdomen is nondistended, soft and nontender. No organomegaly or masses felt. Normal bowel sounds heard. Central nervous system: Alert and oriented. No focal neurological deficits. Extremities: No edema. No calf tenderness Skin: No cyanosis. No rashes Psychiatry: Judgement and insight appear normal. Mood & affect appropriate.     Data Reviewed: I have personally reviewed following labs and imaging studies  CBC: Recent Labs  Lab 03/07/18 1516 03/08/18 0403 03/08/18 1127  WBC 10.2 10.6* 11.2*  HGB 11.7* 9.6* 9.4*  HCT 38.6* 31.4* 30.0*  MCV 89.8 91.0 89.8  PLT 355 289 151   Basic Metabolic Panel: Recent Labs  Lab 03/07/18 1516 03/08/18 0403  NA 143 144  K 3.8 3.4*  CL 107 114*  CO2 26 21*  GLUCOSE 122* 113*  BUN 17 17  CREATININE 1.32* 1.12  CALCIUM 9.6 8.4*   GFR: Estimated Creatinine Clearance: 61 mL/min (by C-G formula based on SCr of 1.12 mg/dL). Liver Function Tests: Recent Labs  Lab 03/07/18 1516 03/08/18 0403  AST 46* 34  ALT 46* 35  ALKPHOS 49 34*  BILITOT 0.9 0.5  PROT 6.7 5.3*  ALBUMIN  3.4* 2.8*    No results found for this or any previous visit (from the past 240 hour(s)).       Radiology Studies: No results found.      Scheduled Meds: . amiodarone  200 mg Oral Daily  . amLODipine  5 mg Oral Daily  . calcium carbonate  2 tablet Oral Daily  . cholecalciferol  1,000 Units Oral Daily  . mirabegron ER  50 mg Oral Daily  . rosuvastatin  40 mg Oral Daily  . triamcinolone cream  1 application Topical BID   Continuous Infusions: .  sodium chloride 100 mL/hr at 03/08/18 1043     LOS: 1 day     Cordelia Poche, MD [facility] 03/08/2018, 12:32 PM  If 7PM-7AM, please contact night-coverage [email or web address] 03/08/2018, 12:32 PM

## 2018-03-08 NOTE — Consult Note (Signed)
[facility] Consult  Referring Provider: Mariel Aloe, MD Primary Care Physician:  Deland Pretty, MD Primary Gastroenterologist: Clarene Essex  Reason for Consultation:  Rectal bleeding  HPI: Evan Dorsey is a 84 y.o. male underwent a prostate biopsy(adenocarcinoma, patient's family members have people reported bedside) performed last week. He was on Eliquis for atrial fibrillation and Eliquis was held 2 days prior to the procedure and was resumed 2 days thereafter. Since 6 AM yesterday morning patient developed large amount of rectal bleeding described as bright red blood along with clots associated with lower abdominal discomfort, without rectal pain. Patient reports that his last bowel movement was before midnight yesterday and he had a normal bowel movement today morning without any blood in it. Patient denies nausea, vomiting, difficulty swallowing or pain on swallowing. Last colonoscopy was performed in 2013 and he had tubular adenomas removed from hepatic flexure and sigmoid colon, he was also noted to have internal and external hemorrhoids and diverticulosis in sigmoid and descending colon.   Past Medical History:  Diagnosis Date  . Adenomatous polyp   . Arthritis    "left shoulder" (03/02/2013)  . Atrial fibrillation ([place])   . Bladder cancer ([place]) 07/31/2011  . CAD (coronary artery disease)    a. remote CABG b. prior cardiac cath 2005 c. Lexiscan Myoview 07/08/12 w/o evidence of ischemia, EF 53%  . Diverticulosis   . Elevated PSA   . Frequent urination    "day and night" (03/02/2013)  . GERD (gastroesophageal reflux disease)   . Gout    "been a long time ago" (03/02/2013)  . History of kidney stones   . HLD (hyperlipidemia)   . HTN (hypertension)   . Myocardial infarction [facility]) 1990's   silent    Past Surgical History:  Procedure Laterality Date  . CARDIAC CATHETERIZATION     total occlusion to LAD, LCX, RCA. LIMA patent, normal seq SVG to OM, severe  stenosis in SVG to PDA, but collaterals to PLA from OM. RCA went to PDA. treated medically   . CARDIOVASCULAR STRESS TEST  02/10/08   6 mets, poor tolerance, no chest pain   . CATARACT EXTRACTION W/ INTRAOCULAR LENS IMPLANT Right 2014  . CORONARY ARTERY BYPASS GRAFT  08/1991   "CABG X4" (03/02/2013)  . CYSTOSCOPY W/ STONE MANIPULATION    . ORIF ACETABULAR FRACTURE Left 04/2007   trimalleolar fracture  . TOTAL SHOULDER ARTHROPLASTY Left 03/02/2013  . TOTAL SHOULDER ARTHROPLASTY Left 03/02/2013   Procedure: LEFT TOTAL SHOULDER ARTHROPLASTY;  Surgeon: Marin Shutter, MD;  Location: [place];  Service: Orthopedics;  Laterality: Left;  . TRANSURETHRAL RESECTION OF BLADDER TUMOR  07/31/2011   Procedure: TRANSURETHRAL RESECTION OF BLADDER TUMOR (TURBT);  Surgeon: Claybon Jabs, MD;  [facility];  Service: Urology;  Laterality: N/A;  with Gyrus  . URETEROSCOPY WITH HOLMIUM LASER LITHOTRIPSY Right 11/06/2016   Procedure: URETEROSCOPY WITH HOLMIUM LASER LITHOTRIPSY/ RETROGRADE PYELOGRAM/ STENT;  Surgeon: Kathie Rhodes, MD;  Location: [facility];  Service: Urology;  Laterality: Right;    Prior to Admission medications   Medication Sig Start Date End Date Taking? Authorizing Provider  acetaminophen (TYLENOL) 500 MG tablet Take 500 mg by mouth every 6 (six) hours as needed.   Yes [provider]  amiodarone (PACERONE) 200 MG tablet TAKE ONE TABLET BY MOUTH DAILY 03/02/18  Yes Stanford Breed Denice Bors, MD  amLODipine (NORVASC) 5 MG tablet TAKE ONE TABLET BY MOUTH EVERY DAY 12/30/17  Yes Stanford Breed Denice Bors, MD  apixaban (ELIQUIS) 5 MG TABS tablet Take 1 tablet (5 mg total) by mouth 2 (two) times daily. 01/20/18  Yes Lelon Perla, MD  bisacodyl (DULCOLAX) 5 MG EC tablet Take 10 mg by mouth daily as needed for moderate constipation.   Yes [provider]  calcium carbonate (TUMS - DOSED IN MG ELEMENTAL CALCIUM) 500 MG chewable tablet Chew 2 tablets by mouth daily.   Yes [provider]  MYRBETRIQ 50 MG TB24 tablet Take 50 mg by mouth daily. 10/20/17  Yes [provider]  rosuvastatin (CRESTOR) 40 MG tablet Take 40 mg by mouth daily. 02/09/18  Yes [provider]  triamcinolone cream (KENALOG) 0.1 % Apply 1 application topically 2 (two) times daily. 12/17/17  Yes [provider]  cholecalciferol (VITAMIN D) 1000 units tablet Take 1,000 Units by mouth daily.    [provider]  NITROSTAT 0.4 MG SL tablet PLACE 1 TABLET UNDER THE TONGUE EVERY 5 MINUTES IF NEEDED FOR CHEST PAIN. MAX = 3 TABS/EPISODE. 02/27/15   Josue Hector, MD    Current Facility-Administered Medications  Medication Dose Route Frequency Provider Last Rate Last Dose  . 0.9 %  sodium chloride infusion   Intravenous Continuous Elwyn Reach, MD 100 mL/hr at 03/08/18 1043    . acetaminophen (TYLENOL) tablet 500 mg  500 mg Oral Q6H PRN Schorr, Rhetta Mura, NP   500 mg at 03/08/18 0015  . amiodarone (PACERONE) tablet 200 mg  200 mg Oral Daily Gala Romney L, MD   200 mg at 03/08/18 1045  . amLODipine (NORVASC) tablet 5 mg  5 mg Oral Daily Gala Romney L, MD   5 mg at 03/08/18 1046  . bisacodyl (DULCOLAX) EC tablet 10 mg  10 mg Oral Daily PRN Elwyn Reach, MD      . calcium carbonate (TUMS - dosed in mg elemental calcium) chewable tablet 400 mg of elemental calcium  2 tablet Oral Daily Gala Romney L, MD   400 mg of elemental calcium at 03/08/18 1045  . cholecalciferol (VITAMIN D) tablet 1,000 Units  1,000 Units Oral Daily Elwyn Reach, MD   1,000 Units at 03/08/18 1048  . mirabegron ER (MYRBETRIQ) tablet 50 mg  50 mg Oral Daily Gala Romney L, MD   50 mg at 03/08/18 1049  . nitroGLYCERIN (NITROSTAT) SL tablet 0.4 mg  0.4 mg Sublingual Q5 min PRN Gala Romney L, MD      . ondansetron (ZOFRAN) tablet 4 mg  4 mg Oral Q6H PRN Elwyn Reach, MD       Or  . ondansetron (ZOFRAN) injection 4 mg  4 mg Intravenous Q6H PRN Jonelle Sidle, Mohammad L, MD       . rosuvastatin (CRESTOR) tablet 40 mg  40 mg Oral Daily Gala Romney L, MD   40 mg at 03/08/18 1048  . triamcinolone cream (KENALOG) 0.1 % 1 application  1 application Topical BID Elwyn Reach, MD        Allergies as of 03/07/2018  . (No Known Allergies)    Family History  Problem Relation Age of Onset  . CAD Father 78  . CAD Brother 77  . CAD Brother 58    Social History   Socioeconomic History  . Marital status: Married    Spouse name: Not on file  . Number of children: 3  . Years of education: Not on file  . Highest education level: Not on file  Occupational History  . Occupation:  retired Psychologist, sport and exercise  Social Needs  . Financial resource strain: Not on file  . Food insecurity:    Worry: Not on file    Inability: Not on file  . Transportation needs:    Medical: Not on file    Non-medical: Not on file  Tobacco Use  . Smoking status: Never Smoker  . Smokeless tobacco: Never Used  Substance and Sexual Activity  . Alcohol use: No  . Drug use: No  . Sexual activity: Never  Lifestyle  . Physical activity:    Days per week: Not on file    Minutes per session: Not on file  . Stress: Not on file  Relationships  . Social connections:    Talks on phone: Not on file    Gets together: Not on file    Attends religious service: Not on file    Active member of club or organization: Not on file    Attends meetings of clubs or organizations: Not on file    Relationship status: Not on file  . Intimate partner violence:    Fear of current or ex partner: Not on file    Emotionally abused: Not on file    Physically abused: Not on file    Forced sexual activity: Not on file  Other Topics Concern  . Not on file  Social History Narrative   Lives at home with wife.      Review of Systems: Positive for: GI: Described in detail in HPI.    Gen: Denies any fever, chills, rigors, night sweats, anorexia, fatigue, weakness, malaise, involuntary weight loss, and sleep  disorder CV: Denies chest pain, angina, palpitations, syncope, orthopnea, PND, peripheral edema, and claudication. Resp: Denies dyspnea, cough, sputum, wheezing, coughing up blood. GU : Denies urinary burning, blood in urine, urinary frequency, urinary hesitancy, nocturnal urination, and urinary incontinence. MS: Denies joint pain or swelling.  Denies muscle weakness, cramps, atrophy.  Derm: Denies rash, itching, oral ulcerations, hives, unhealing ulcers.  Psych: Denies depression, anxiety, memory loss, suicidal ideation, hallucinations,  and confusion. Heme: rectal bleeding, Denies bruising, and enlarged lymph nodes. Neuro:  Denies any headaches, dizziness, paresthesias. Endo:  Denies any problems with DM, thyroid, adrenal function.  Physical Exam: Vital signs in last 24 hours: Temp:  [97.5 F (36.4 C)-98.1 F (36.7 C)] 97.6 F (36.4 C) (09/24 0922) Pulse Rate:  [57-69] 57 (09/24 1022) Resp:  [12-21] 18 (09/24 0922) BP: (107-159)/(49-82) 121/61 (09/24 1022) SpO2:  [92 %-99 %] 96 % (09/24 0513) Weight:  [102.9 kg-106.6 kg] 102.9 kg (09/23 2012) Last BM Date: 03/06/18  General:   Alert,  Well-developed, well-nourished, pleasant and cooperative in NAD,sitting comfortably in his bed, eating lunch Head:  Normocephalic and atraumatic. Eyes:  Sclera clear, no icterus.  Mild pallor Ears:  Hard of hearing Nose:  No deformity, discharge,  or lesions. Mouth:  No deformity or lesions.  Oropharynx pink & moist. Neck:  Supple; no masses or thyromegaly. Lungs:  Clear throughout to auscultation.   No wheezes, crackles, or rhonchi. No acute distress. Heart:  Regular rate and rhythm; no murmurs, clicks, rubs,  or gallops. Extremities:  Without clubbing or edema, left shoulder surgical scar Neurologic:  Alert and  oriented x4;  grossly normal neurologically. Skin:  Intact without significant lesions or rashes. Psych:  Alert and cooperative. Normal mood and affect. Abdomen:  Soft, nontender and  nondistended. No masses, hepatosplenomegaly or hernias noted. Normal bowel sounds, without guarding, and without rebound.  Lab Results: Recent Labs    03/07/18 1516 03/08/18 0403 03/08/18 1127  WBC 10.2 10.6* 11.2*  HGB 11.7* 9.6* 9.4*  HCT 38.6* 31.4* 30.0*  PLT 355 289 296   BMET Recent Labs    03/07/18 1516 03/08/18 0403  NA 143 144  K 3.8 3.4*  CL 107 114*  CO2 26 21*  GLUCOSE 122* 113*  BUN 17 17  CREATININE 1.32* 1.12  CALCIUM 9.6 8.4*   LFT Recent Labs    03/08/18 0403  PROT 5.3*  ALBUMIN 2.8*  AST 34  ALT 35  ALKPHOS 34*  BILITOT 0.5   PT/INR No results for input(s): LABPROT, INR in the last 72 hours.  Studies/Results: No results found.  Impression: Rectal bleeding, post prostate biopsy, after resuming Eliquis for atrial fibrillation Hemoglobin dropped from 11.7-9.6 and 9.4 No further bleeding since midnight yesterday  Plan: Monitor H&H and transfuse if needed. Patient already on a regular meals. If there are further episodes of rectal bleeding plan a sigmoidoscopy. If no further rectal bleeding noted for the next 48 hours, okay to resume Eliquis.  Patient's family member requesting for an oncology consultation for newly diagnosed prostate adenocarcinoma. Patient was advised to get a CAT scan and bone scan for further staging, family requesting further workup to be done as inpatient as they live 15 minutes away. Discussed the same with patient's hospitalist Dr. Lonny Prude.   LOS: 1 day   Ronnette Juniper, M.D.  03/08/2018, 12:56 PM  Pager [phone] If no answer or after 5 PM call [phone]

## 2018-03-08 NOTE — Consult Note (Signed)
Reason for Consult:  Referring Physician: Jossie Ng MD  Evan Dorsey is an 84 y.o. male.   HPI:   1 - High Risk Prostate Cancer - 10/12 cores mix of Gleason 6,7,8 disesae up to 90% of cores by biopsy 02/2018 that progressed in prior surveillance.  CT and Bone scan pending.  2 - Rectal Bleed After Prostate Biopsy - s/p biopy 03/01/18 (held eliquus peri-BX). New rectal bleeding 9/22 x several which has improved holding Eliquus again.  Today "[name]" is seen in consultation for above.   Past Medical History:  Diagnosis Date  . Adenomatous polyp   . Arthritis    "left shoulder" (03/02/2013)  . Atrial fibrillation ([place])   . Bladder cancer ([place]) 07/31/2011  . CAD (coronary artery disease)    a. remote CABG b. prior cardiac cath 2005 c. Lexiscan Myoview 07/08/12 w/o evidence of ischemia, EF 53%  . Diverticulosis   . Elevated PSA   . Frequent urination    "day and night" (03/02/2013)  . GERD (gastroesophageal reflux disease)   . Gout    "been a long time ago" (03/02/2013)  . History of kidney stones   . HLD (hyperlipidemia)   . HTN (hypertension)   . Myocardial infarction [facility]) 1990's   silent    Past Surgical History:  Procedure Laterality Date  . CARDIAC CATHETERIZATION     total occlusion to LAD, LCX, RCA. LIMA patent, normal seq SVG to OM, severe stenosis in SVG to PDA, but collaterals to PLA from OM. RCA went to PDA. treated medically   . CARDIOVASCULAR STRESS TEST  02/10/08   6 mets, poor tolerance, no chest pain   . CATARACT EXTRACTION W/ INTRAOCULAR LENS IMPLANT Right 2014  . CORONARY ARTERY BYPASS GRAFT  08/1991   "CABG X4" (03/02/2013)  . CYSTOSCOPY W/ STONE MANIPULATION    . ORIF ACETABULAR FRACTURE Left 04/2007   trimalleolar fracture  . TOTAL SHOULDER ARTHROPLASTY Left 03/02/2013  . TOTAL SHOULDER ARTHROPLASTY Left 03/02/2013   Procedure: LEFT TOTAL SHOULDER ARTHROPLASTY;  Surgeon: Marin Shutter, MD;  Location: [place];  Service: Orthopedics;  Laterality: Left;   . TRANSURETHRAL RESECTION OF BLADDER TUMOR  07/31/2011   Procedure: TRANSURETHRAL RESECTION OF BLADDER TUMOR (TURBT);  Surgeon: Claybon Jabs, MD;  [facility];  Service: Urology;  Laterality: N/A;  with Gyrus  . URETEROSCOPY WITH HOLMIUM LASER LITHOTRIPSY Right 11/06/2016   Procedure: URETEROSCOPY WITH HOLMIUM LASER LITHOTRIPSY/ RETROGRADE PYELOGRAM/ STENT;  Surgeon: Kathie Rhodes, MD;  Location: [facility];  Service: Urology;  Laterality: Right;    Family History  Problem Relation Age of Onset  . CAD Father 13  . CAD Brother 2  . CAD Brother 4    Social History:  reports that he has never smoked. He has never used smokeless tobacco. He reports that he does not drink alcohol or use drugs.  Allergies: No Known Allergies  Medications: I have reviewed the patient's current medications.  Results for orders placed or performed during the hospital encounter of 03/07/18 (from the past 48 hour(s))  Comprehensive metabolic panel     Status: Abnormal   Collection Time: 03/07/18  3:16 PM  Result Value Ref Range   Sodium 143 135 - 145 mmol/L   Potassium 3.8 3.5 - 5.1 mmol/L   Chloride 107 98 - 111 mmol/L   CO2 26 22 - 32 mmol/L   Glucose, Bld 122 (H) 70 - 99 mg/dL   BUN 17 8 - 23 mg/dL  Creatinine, Ser 1.32 (H) 0.61 - 1.24 mg/dL   Calcium 9.6 8.9 - 10.3 mg/dL   Total Protein 6.7 6.5 - 8.1 g/dL   Albumin 3.4 (L) 3.5 - 5.0 g/dL   AST 46 (H) 15 - 41 U/L   ALT 46 (H) 0 - 44 U/L   Alkaline Phosphatase 49 38 - 126 U/L   Total Bilirubin 0.9 0.3 - 1.2 mg/dL   GFR calc non Af Amer 48 (L) >60 mL/min   GFR calc Af Amer 56 (L) >60 mL/min    Comment: (NOTE) The eGFR has been calculated using the CKD EPI equation. This calculation has not been validated in all clinical situations. eGFR's persistently <60 mL/min signify possible Chronic Kidney Disease.    Anion gap 10 5 - 15    Comment: Performed at [facility], [place] [address] [place], [place]  [address]  CBC     Status: Abnormal   Collection Time: 03/07/18  3:16 PM  Result Value Ref Range   WBC 10.2 4.0 - 10.5 K/uL   RBC 4.30 4.22 - 5.81 MIL/uL   Hemoglobin 11.7 (L) 13.0 - 17.0 g/dL   HCT 38.6 (L) 39.0 - 52.0 %   MCV 89.8 78.0 - 100.0 fL   MCH 27.2 26.0 - 34.0 pg   MCHC 30.3 30.0 - 36.0 g/dL   RDW 15.9 (H) 11.5 - 15.5 %   Platelets 355 150 - 400 K/uL    Comment: Performed at [facility], [place] [address] [place], [place] [address]  Type and screen [place]     Status: None   Collection Time: 03/07/18  3:16 PM  Result Value Ref Range   ABO/RH(D) O POS    Antibody Screen NEG    Sample Expiration      03/10/2018 Performed at [facility], [place] [address] [place], [place] [address]   ABO/Rh     Status: None   Collection Time: 03/07/18  4:31 PM  Result Value Ref Range   ABO/RH(D)      O POS Performed at [facility], [place] [address], [place], [place] [address]   I-Stat Troponin, ED (not at [facility])     Status: None   Collection Time: 03/07/18  4:32 PM  Result Value Ref Range   Troponin i, poc 0.00 0.00 - 0.08 ng/mL   Comment 3            Comment: Due to the release kinetics of cTnI, a negative result within the first hours of the onset of symptoms does not rule out myocardial infarction with certainty. If myocardial infarction is still suspected, repeat the test at appropriate intervals.   POC occult blood, ED     Status: Abnormal   Collection Time: 03/07/18  4:52 PM  Result Value Ref Range   Fecal Occult Bld POSITIVE (A) NEGATIVE  Comprehensive metabolic panel     Status: Abnormal   Collection Time: 03/08/18  4:03 AM  Result Value Ref Range   Sodium 144 135 - 145 mmol/L   Potassium 3.4 (L) 3.5 - 5.1 mmol/L   Chloride 114 (H) 98 - 111 mmol/L   CO2 21 (L) 22 - 32 mmol/L   Glucose, Bld 113 (H) 70 - 99 mg/dL   BUN 17 8 - 23 mg/dL   Creatinine, Ser 1.12 0.61 - 1.24 mg/dL   Calcium 8.4 (L)  8.9 - 10.3 mg/dL   Total Protein 5.3 (L) 6.5 -  8.1 g/dL   Albumin 2.8 (L) 3.5 - 5.0 g/dL   AST 34 15 - 41 U/L   ALT 35 0 - 44 U/L   Alkaline Phosphatase 34 (L) 38 - 126 U/L   Total Bilirubin 0.5 0.3 - 1.2 mg/dL   GFR calc non Af Amer 59 (L) >60 mL/min   GFR calc Af Amer >60 >60 mL/min    Comment: (NOTE) The eGFR has been calculated using the CKD EPI equation. This calculation has not been validated in all clinical situations. eGFR's persistently <60 mL/min signify possible Chronic Kidney Disease.    Anion gap 9 5 - 15    Comment: Performed at [facility], [place] [address], [place], [place] [address]  CBC     Status: Abnormal   Collection Time: 03/08/18  4:03 AM  Result Value Ref Range   WBC 10.6 (H) 4.0 - 10.5 K/uL   RBC 3.45 (L) 4.22 - 5.81 MIL/uL   Hemoglobin 9.6 (L) 13.0 - 17.0 g/dL   HCT 31.4 (L) 39.0 - 52.0 %   MCV 91.0 78.0 - 100.0 fL   MCH 27.8 26.0 - 34.0 pg   MCHC 30.6 30.0 - 36.0 g/dL   RDW 16.2 (H) 11.5 - 15.5 %   Platelets 289 150 - 400 K/uL    Comment: Performed at [facility], [place] [address], [place], [place] [address]  CBC     Status: Abnormal   Collection Time: 03/08/18 11:27 AM  Result Value Ref Range   WBC 11.2 (H) 4.0 - 10.5 K/uL   RBC 3.34 (L) 4.22 - 5.81 MIL/uL   Hemoglobin 9.4 (L) 13.0 - 17.0 g/dL   HCT 30.0 (L) 39.0 - 52.0 %   MCV 89.8 78.0 - 100.0 fL   MCH 28.1 26.0 - 34.0 pg   MCHC 31.3 30.0 - 36.0 g/dL   RDW 16.0 (H) 11.5 - 15.5 %   Platelets 296 150 - 400 K/uL    Comment: Performed at [facility], [place] [address], [place], [place] [address]    No results found.  Review of Systems  Constitutional: Negative for chills and fever.  HENT: Negative.   Eyes: Negative.   Respiratory: Negative.   Cardiovascular: Negative.   Gastrointestinal: Positive for blood in stool.  Genitourinary: Negative.  Negative for flank pain and hematuria.  Musculoskeletal: Negative.   Skin: Negative.    Neurological: Positive for dizziness.  Endo/Heme/Allergies: Negative.   Psychiatric/Behavioral: Negative.    Blood pressure 121/61, pulse (!) 57, temperature 97.6 F (36.4 C), temperature source Oral, resp. rate 18, height '5\' 11"'  (1.803 m), weight 102.9 kg, SpO2 96 %. Physical Exam  Constitutional: He appears well-developed.  Son in room as well.   HENT:  Hard of hearing.   Eyes: Pupils are equal, round, and reactive to light.  Neck: Normal range of motion.  Cardiovascular: Normal rate.  Respiratory: Effort normal.  GI: Soft.  Genitourinary: Penis normal.  Genitourinary Comments: No CVAT  Musculoskeletal: Normal range of motion.  Neurological: He is alert.  Skin: Skin is warm.  Psychiatric: He has a normal mood and affect.    Assessment/Plan:  1 - High Risk Prostate Cancer - Agree with outpatient staging imaging then consider curative v. Non-curative options pending pt's goals. Pt's youngest son had many questions regarding management options and we briefly discussed this today with emphasis on primary radiation (curative) v. Androgen deprivation alone.   2 - Rectal Bleed After Prostate Biopsy -  unusual timeline. Agree with conservative management with holiding eliquus, serial H/H, then restart eliquus in house after H/H stable x 48 hours to verify no recurrent acute bleed before DC.  Greatly appreciate hospitalist and GI team comangement.   Cylah Fannin 03/08/2018, 1:12 PM

## 2018-03-09 DIAGNOSIS — R55 Syncope and collapse: Secondary | ICD-10-CM

## 2018-03-09 DIAGNOSIS — E7800 Pure hypercholesterolemia, unspecified: Secondary | ICD-10-CM

## 2018-03-09 DIAGNOSIS — R42 Dizziness and giddiness: Secondary | ICD-10-CM

## 2018-03-09 DIAGNOSIS — I4891 Unspecified atrial fibrillation: Secondary | ICD-10-CM

## 2018-03-09 LAB — CBC
HEMATOCRIT: 27.1 % — AB (ref 39.0–52.0)
Hemoglobin: 8.3 g/dL — ABNORMAL LOW (ref 13.0–17.0)
MCH: 27.8 pg (ref 26.0–34.0)
MCHC: 30.6 g/dL (ref 30.0–36.0)
MCV: 90.6 fL (ref 78.0–100.0)
Platelets: 247 10*3/uL (ref 150–400)
RBC: 2.99 MIL/uL — ABNORMAL LOW (ref 4.22–5.81)
RDW: 16 % — AB (ref 11.5–15.5)
WBC: 9.9 10*3/uL (ref 4.0–10.5)

## 2018-03-09 LAB — BASIC METABOLIC PANEL
Anion gap: 6 (ref 5–15)
BUN: 13 mg/dL (ref 8–23)
CALCIUM: 8.3 mg/dL — AB (ref 8.9–10.3)
CO2: 23 mmol/L (ref 22–32)
CREATININE: 1.19 mg/dL (ref 0.61–1.24)
Chloride: 115 mmol/L — ABNORMAL HIGH (ref 98–111)
GFR calc non Af Amer: 55 mL/min — ABNORMAL LOW (ref >60)
Glucose, Bld: 94 mg/dL (ref 70–99)
Potassium: 3.3 mmol/L — ABNORMAL LOW (ref 3.5–5.1)
Sodium: 144 mmol/L (ref 135–145)

## 2018-03-09 LAB — HEMOGLOBIN AND HEMATOCRIT, BLOOD
HEMATOCRIT: 29.9 % — AB (ref 39.0–52.0)
Hemoglobin: 9.2 g/dL — ABNORMAL LOW (ref 13.0–17.0)

## 2018-03-09 MED ORDER — POTASSIUM CHLORIDE CRYS ER 20 MEQ PO TBCR
40.0000 meq | EXTENDED_RELEASE_TABLET | Freq: Once | ORAL | Status: AC
  Administered 2018-03-09: 40 meq via ORAL
  Filled 2018-03-09: qty 2

## 2018-03-09 MED ORDER — APIXABAN 5 MG PO TABS
5.0000 mg | ORAL_TABLET | Freq: Two times a day (BID) | ORAL | Status: DC
  Administered 2018-03-09 – 2018-03-10 (×2): 5 mg via ORAL
  Filled 2018-03-09 (×2): qty 1

## 2018-03-09 MED ORDER — PSYLLIUM 95 % PO PACK
1.0000 | PACK | Freq: Every day | ORAL | Status: DC
  Administered 2018-03-09 – 2018-03-10 (×2): 1 via ORAL
  Filled 2018-03-09 (×2): qty 1

## 2018-03-09 NOTE — Progress Notes (Signed)
Subjective: The patient was seen and examined at bedside. Reports having semi-formed brown stools, and no bleeding per rectum since before midnight -2 nights ago(more than 36 hours).  Objective: Vital signs in last 24 hours: Temp:  [98.1 F (36.7 C)-99.4 F (37.4 C)] 98.9 F (37.2 C) (09/25 0423) Pulse Rate:  [60-64] 60 (09/25 0423) Resp:  [16-20] 18 (09/25 0423) BP: (123-124)/(51-59) 124/59 (09/25 0423) SpO2:  [93 %-94 %] 94 % (09/25 0423) Weight change:  Last BM Date: 03/08/18  IF:OYDXAJO comfortably on bedside chair GENERAL:mild pallor ABDOMEN:soft, nondistended, nontender, normoactive bowel sounds EXTREMITIES:no deformity, no edema  Lab Results: Results for orders placed or performed during the hospital encounter of 03/07/18 (from the past 48 hour(s))  Comprehensive metabolic panel     Status: Abnormal   Collection Time: 03/07/18  3:16 PM  Result Value Ref Range   Sodium 143 135 - 145 mmol/L   Potassium 3.8 3.5 - 5.1 mmol/L   Chloride 107 98 - 111 mmol/L   CO2 26 22 - 32 mmol/L   Glucose, Bld 122 (H) 70 - 99 mg/dL   BUN 17 8 - 23 mg/dL   Creatinine, Ser 1.32 (H) 0.61 - 1.24 mg/dL   Calcium 9.6 8.9 - 10.3 mg/dL   Total Protein 6.7 6.5 - 8.1 g/dL   Albumin 3.4 (L) 3.5 - 5.0 g/dL   AST 46 (H) 15 - 41 U/L   ALT 46 (H) 0 - 44 U/L   Alkaline Phosphatase 49 38 - 126 U/L   Total Bilirubin 0.9 0.3 - 1.2 mg/dL   GFR calc non Af Amer 48 (L) >60 mL/min   GFR calc Af Amer 56 (L) >60 mL/min    Comment: (NOTE) The eGFR has been calculated using the CKD EPI equation. This calculation has not been validated in all clinical situations. eGFR's persistently <60 mL/min signify possible Chronic Kidney Disease.    Anion gap 10 5 - 15    Comment: Performed at [facility], [place] [address], [place], [place] [address]  CBC     Status: Abnormal   Collection Time: 03/07/18  3:16 PM  Result Value Ref Range   WBC 10.2 4.0 - 10.5 K/uL   RBC 4.30 4.22 - 5.81 MIL/uL   Hemoglobin 11.7 (L) 13.0 - 17.0 g/dL   HCT 38.6 (L) 39.0 - 52.0 %   MCV 89.8 78.0 - 100.0 fL   MCH 27.2 26.0 - 34.0 pg   MCHC 30.3 30.0 - 36.0 g/dL   RDW 15.9 (H) 11.5 - 15.5 %   Platelets 355 150 - 400 K/uL    Comment: Performed at [facility], [place] [address] [place], [place] [address]  Type and screen [place]     Status: None   Collection Time: 03/07/18  3:16 PM  Result Value Ref Range   ABO/RH(D) O POS    Antibody Screen NEG    Sample Expiration      03/10/2018 Performed at [facility], [place] [address] [place], [place] [address]   ABO/Rh     Status: None   Collection Time: 03/07/18  4:31 PM  Result Value Ref Range   ABO/RH(D)      O POS Performed at [facility], [place] [address], [place], [place] [address]   I-Stat Troponin, ED (not at [facility])     Status: None   Collection Time: 03/07/18  4:32 PM  Result Value Ref Range   Troponin i,  poc 0.00 0.00 - 0.08 ng/mL   Comment 3            Comment: Due to the release kinetics of cTnI, a negative result within the first hours of the onset of symptoms does not rule out myocardial infarction with certainty. If myocardial infarction is still suspected, repeat the test at appropriate intervals.   POC occult blood, ED     Status: Abnormal   Collection Time: 03/07/18  4:52 PM  Result Value Ref Range   Fecal Occult Bld POSITIVE (A) NEGATIVE  Comprehensive metabolic panel     Status: Abnormal   Collection Time: 03/08/18  4:03 AM  Result Value Ref Range   Sodium 144 135 - 145 mmol/L   Potassium 3.4 (L) 3.5 - 5.1 mmol/L   Chloride 114 (H) 98 - 111 mmol/L   CO2 21 (L) 22 - 32 mmol/L   Glucose, Bld 113 (H) 70 - 99 mg/dL   BUN 17 8 - 23 mg/dL   Creatinine, Ser 1.12 0.61 - 1.24 mg/dL   Calcium 8.4 (L) 8.9 - 10.3 mg/dL   Total Protein 5.3 (L) 6.5 - 8.1 g/dL   Albumin 2.8 (L) 3.5 - 5.0 g/dL   AST 34 15 - 41 U/L   ALT 35 0 - 44 U/L   Alkaline Phosphatase  34 (L) 38 - 126 U/L   Total Bilirubin 0.5 0.3 - 1.2 mg/dL   GFR calc non Af Amer 59 (L) >60 mL/min   GFR calc Af Amer >60 >60 mL/min    Comment: (NOTE) The eGFR has been calculated using the CKD EPI equation. This calculation has not been validated in all clinical situations. eGFR's persistently <60 mL/min signify possible Chronic Kidney Disease.    Anion gap 9 5 - 15    Comment: Performed at [facility], [place] [address] [place], [place] [address]  CBC     Status: Abnormal   Collection Time: 03/08/18  4:03 AM  Result Value Ref Range   WBC 10.6 (H) 4.0 - 10.5 K/uL   RBC 3.45 (L) 4.22 - 5.81 MIL/uL   Hemoglobin 9.6 (L) 13.0 - 17.0 g/dL   HCT 31.4 (L) 39.0 - 52.0 %   MCV 91.0 78.0 - 100.0 fL   MCH 27.8 26.0 - 34.0 pg   MCHC 30.6 30.0 - 36.0 g/dL   RDW 16.2 (H) 11.5 - 15.5 %   Platelets 289 150 - 400 K/uL    Comment: Performed at [facility], [place] [address], [place], [place] [address]  CBC     Status: Abnormal   Collection Time: 03/08/18 11:27 AM  Result Value Ref Range   WBC 11.2 (H) 4.0 - 10.5 K/uL   RBC 3.34 (L) 4.22 - 5.81 MIL/uL   Hemoglobin 9.4 (L) 13.0 - 17.0 g/dL   HCT 30.0 (L) 39.0 - 52.0 %   MCV 89.8 78.0 - 100.0 fL   MCH 28.1 26.0 - 34.0 pg   MCHC 31.3 30.0 - 36.0 g/dL   RDW 16.0 (H) 11.5 - 15.5 %   Platelets 296 150 - 400 K/uL    Comment: Performed at [facility], [place] [address], [place], [place] [address]  CBC     Status: Abnormal   Collection Time: 03/08/18  4:49 PM  Result Value Ref Range   WBC 10.4 4.0 - 10.5 K/uL   RBC 3.19 (L) 4.22 - 5.81 MIL/uL   Hemoglobin 8.9 (L) 13.0 - 17.0 g/dL  HCT 28.7 (L) 39.0 - 52.0 %   MCV 90.0 78.0 - 100.0 fL   MCH 27.9 26.0 - 34.0 pg   MCHC 31.0 30.0 - 36.0 g/dL   RDW 16.0 (H) 11.5 - 15.5 %   Platelets 296 150 - 400 K/uL    Comment: Performed at [facility], [place] [address], [place], [place] [address]  CBC     Status: Abnormal   Collection  Time: 03/08/18 10:43 PM  Result Value Ref Range   WBC 11.2 (H) 4.0 - 10.5 K/uL   RBC 3.01 (L) 4.22 - 5.81 MIL/uL   Hemoglobin 8.3 (L) 13.0 - 17.0 g/dL   HCT 27.3 (L) 39.0 - 52.0 %   MCV 90.7 78.0 - 100.0 fL   MCH 27.6 26.0 - 34.0 pg   MCHC 30.4 30.0 - 36.0 g/dL   RDW 16.1 (H) 11.5 - 15.5 %   Platelets 256 150 - 400 K/uL    Comment: Performed at [facility], [place] [address] [place], [place] [address]  CBC     Status: Abnormal   Collection Time: 03/09/18  4:29 AM  Result Value Ref Range   WBC 9.9 4.0 - 10.5 K/uL   RBC 2.99 (L) 4.22 - 5.81 MIL/uL   Hemoglobin 8.3 (L) 13.0 - 17.0 g/dL   HCT 27.1 (L) 39.0 - 52.0 %   MCV 90.6 78.0 - 100.0 fL   MCH 27.8 26.0 - 34.0 pg   MCHC 30.6 30.0 - 36.0 g/dL   RDW 16.0 (H) 11.5 - 15.5 %   Platelets 247 150 - 400 K/uL    Comment: Performed at [facility] [place] [address] [place], [place] [address]    Studies/Results: No results found.  Medications: I have reviewed the patient's current medications.  Assessment: 1.Rectal bleeding post prostate biopsy-resolved Hemoglobin remained stable at 8.9/8.3/8.3 Hemodynamically stable  2.Prostate adenocarcinoma-newly diagnosed  3. Atrial fibrillation-Eliquis currently on hold  4.Tubular adenoma removed from hepatic flexure/sigmoid in 2013,further surveillance colonoscopies were not recommended at that point due to age.  5.History of constipation-patient reports worsening with Myrbetriq  Plan: 1. Okay to resume Eliquis from GI standpoint. 2. Start patient on Metamucil/Benefiber 1 tablespoon in 8 ounces of water daily to avoid constipation. If no further bleeding noted in the next 24 hours, okay to discharge from GI standpoint.   Ronnette Juniper 03/09/2018, 10:55 AM   Pager [phone] If no answer or after 5 PM call [phone]

## 2018-03-09 NOTE — Progress Notes (Signed)
ANTICOAGULATION CONSULT NOTE - Initial Consult  Pharmacy Consult for apixaban Indication: atrial fibrillation  No Known Allergies  Patient Measurements: Height: 5\' 11"  (180.3 cm) Weight: 226 lb 13.7 oz (102.9 kg) IBW/kg (Calculated) : 75.3  Vital Signs: Temp: 98.1 F (36.7 C) (09/25 1325) Temp Source: Oral (09/25 1325) BP: 110/57 (09/25 1325) Pulse Rate: 59 (09/25 1325)  Labs: Recent Labs    03/07/18 1516 03/08/18 0403  03/08/18 1649 03/08/18 2243 03/09/18 0429  HGB 11.7* 9.6*   < > 8.9* 8.3* 8.3*  HCT 38.6* 31.4*   < > 28.7* 27.3* 27.1*  PLT 355 289   < > 296 256 247  CREATININE 1.32* 1.12  --   --   --  1.19   < > = values in this interval not displayed.    Estimated Creatinine Clearance: 57.4 mL/min (by C-G formula based on SCr of 1.19 mg/dL).   Medical History: Past Medical History:  Diagnosis Date  . Adenomatous polyp   . Arthritis    "left shoulder" (03/02/2013)  . Atrial fibrillation ([place])   . Bladder cancer ([place]) 07/31/2011  . CAD (coronary artery disease)    a. remote CABG b. prior cardiac cath 2005 c. Lexiscan Myoview 07/08/12 w/o evidence of ischemia, EF 53%  . Diverticulosis   . Elevated PSA   . Frequent urination    "day and night" (03/02/2013)  . GERD (gastroesophageal reflux disease)   . Gout    "been a long time ago" (03/02/2013)  . History of kidney stones   . HLD (hyperlipidemia)   . HTN (hypertension)   . Myocardial infarction [facility]) 1990's   silent    Medications:  Medications Prior to Admission  Medication Sig Dispense Refill Last Dose  . acetaminophen (TYLENOL) 500 MG tablet Take 500 mg by mouth every 6 (six) hours as needed.   03/06/2018 at Unknown time  . amiodarone (PACERONE) 200 MG tablet TAKE ONE TABLET BY MOUTH DAILY 30 tablet 6 03/06/2018 at Unknown time  . amLODipine (NORVASC) 5 MG tablet TAKE ONE TABLET BY MOUTH EVERY DAY 90 tablet 3 03/07/2018 at Unknown time  . apixaban (ELIQUIS) 5 MG TABS tablet Take 1 tablet (5 mg total) by  mouth 2 (two) times daily. 56 tablet 0 03/06/2018 at 800 pm  . bisacodyl (DULCOLAX) 5 MG EC tablet Take 10 mg by mouth daily as needed for moderate constipation.   Past Week at Unknown time  . calcium carbonate (TUMS - DOSED IN MG ELEMENTAL CALCIUM) 500 MG chewable tablet Chew 2 tablets by mouth daily.   03/07/2018 at Unknown time  . MYRBETRIQ 50 MG TB24 tablet Take 50 mg by mouth daily.  11 03/06/2018 at Unknown time  . rosuvastatin (CRESTOR) 40 MG tablet Take 40 mg by mouth daily.  3 03/06/2018 at Unknown time  . triamcinolone cream (KENALOG) 0.1 % Apply 1 application topically 2 (two) times daily.  0 03/06/2018 at Unknown time  . cholecalciferol (VITAMIN D) 1000 units tablet Take 1,000 Units by mouth daily.   unknown  . NITROSTAT 0.4 MG SL tablet PLACE 1 TABLET UNDER THE TONGUE EVERY 5 MINUTES IF NEEDED FOR CHEST PAIN. MAX = 3 TABS/EPISODE. 25 tablet 0 on hand    Assessment: 84 y/o M with a h/o atrial fibrillation admitted with rectal bleeding. Patient evaluated by GI with recommendation to resume apixaban.   Goal of Therapy:  Monitor platelets by anticoagulation protocol: Yes   Plan:  Will resume apixaban 5 mg bid from tonight.  Ulice Dash D 03/09/2018,3:11 PM

## 2018-03-09 NOTE — Progress Notes (Signed)
PROGRESS NOTE    Evan Dorsey  NTI:[id number] DOB: June 09, 1933 DOA: 03/07/2018 PCP: Deland Pretty, MD    Brief Narrative:  Evan Dorsey is a 84 y.o. malewith medical history significant ofcoronary artery disease status post coronary bypass grafting, atrial fibrillation on Eliquis, hypertension history of kidney stone and GERD. He presented secondary to bright red blood per rectum. He had a recent prostate biopsy and is on Eliquis. Hemoglobin has dropped. He had some bleeding overnight, but none today.  Assessment & Plan:   Principal Problem:   Postural dizziness with presyncope Active Problems:   HYPERCHOLESTEROLEMIA   HYPERTENSION, BENIGN   CORONARY ATHEROSCLEROSIS NATIVE CORONARY ARTERY   Atrial fibrillation (HCC)   Rectal bleeding   Bright red blood per rectum:  Likely from biopsy.  Eliquis held initially, so far no more bleeding. Resume eliquis tonight, watch for hgb drop in the next 24 hours and if stable , can d/c home tomorrow afternoon.    High risk prostate cancer status post biopsy in September 2019.  Recommend outpatient work-up and follow-up with Dr. Tresa Moore   Dizziness:  Resolved.    Acute blood loss anemia:  Baseline hemoglobin is around 11 dropped to around 8, transfuse to keep hemoglobin greater than 7..  Suspect suspect the anemia is from rectal bleeding.   Pretension Well-controlled   She will fibrillation rate controlled on amiodarone.  Eliquis for anticoagulation.   Hyperlipidemia resume statin on discharge.  DVT prophylaxis: SCD's Code Status: full code.  Family Communication: son at bedside.  Disposition Plan: possible d/c tomorrow.  Hemoglobin stays stable and patient does not have any more rectal bleed.   Consultants: Urology, gastroenterology  Procedures: None  Antimicrobials: None   Subjective: Soft brown bowel movements.  No bleeding seen.  Objective: Vitals:   03/08/18 1423 03/08/18 2038 03/09/18 0423 03/09/18 1325    BP: (!) 124/51 (!) 123/52 (!) 124/59 (!) 110/57  Pulse: 63 64 60 (!) 59  Resp: 20 16 18 16   Temp: 98.1 F (36.7 C) 99.4 F (37.4 C) 98.9 F (37.2 C) 98.1 F (36.7 C)  TempSrc: Oral Oral Oral Oral  SpO2:  93% 94% 96%  Weight:      Height:        Intake/Output Summary (Last 24 hours) at 03/09/2018 1500 Last data filed at 03/09/2018 1330 Gross per 24 hour  Intake 1127.61 ml  Output 1050 ml  Net 77.61 ml   Filed Weights   03/07/18 1504 03/07/18 2012  Weight: 106.6 kg 102.9 kg    Examination:  General exam: Appears calm and comfortable  Respiratory system: Clear to auscultation. Respiratory effort normal. Cardiovascular system: S1 & S2 heard, RRR. No JVD, murmurs,  Gastrointestinal system: Abdomen is nondistended, soft and nontender. No organomegaly or masses felt. Normal bowel sounds heard. Central nervous system: Alert and oriented. No focal neurological deficits. Extremities: Symmetric 5 x 5 power. Skin: No rashes, lesions or ulcers Psychiatry:  Mood & affect appropriate.     Data Reviewed: I have personally reviewed following labs and imaging studies  CBC: Recent Labs  Lab 03/08/18 0403 03/08/18 1127 03/08/18 1649 03/08/18 2243 03/09/18 0429  WBC 10.6* 11.2* 10.4 11.2* 9.9  HGB 9.6* 9.4* 8.9* 8.3* 8.3*  HCT 31.4* 30.0* 28.7* 27.3* 27.1*  MCV 91.0 89.8 90.0 90.7 90.6  PLT 289 296 296 256 867   Basic Metabolic Panel: Recent Labs  Lab 03/07/18 1516 03/08/18 0403 03/09/18 0429  NA 143 144 144  K 3.8 3.4* 3.3*  CL 107 114* 115*  CO2 26 21* 23  GLUCOSE 122* 113* 94  BUN 17 17 13   CREATININE 1.32* 1.12 1.19  CALCIUM 9.6 8.4* 8.3*   GFR: Estimated Creatinine Clearance: 57.4 mL/min (by C-G formula based on SCr of 1.19 mg/dL). Liver Function Tests: Recent Labs  Lab 03/07/18 1516 03/08/18 0403  AST 46* 34  ALT 46* 35  ALKPHOS 49 34*  BILITOT 0.9 0.5  PROT 6.7 5.3*  ALBUMIN 3.4* 2.8*   No results for input(s): LIPASE, AMYLASE in the last 168  hours. No results for input(s): AMMONIA in the last 168 hours. Coagulation Profile: No results for input(s): INR, PROTIME in the last 168 hours. Cardiac Enzymes: No results for input(s): CKTOTAL, CKMB, CKMBINDEX, TROPONINI in the last 168 hours. BNP (last 3 results) No results for input(s): PROBNP in the last 8760 hours. HbA1C: No results for input(s): HGBA1C in the last 72 hours. CBG: No results for input(s): GLUCAP in the last 168 hours. Lipid Profile: No results for input(s): CHOL, HDL, LDLCALC, TRIG, CHOLHDL, LDLDIRECT in the last 72 hours. Thyroid Function Tests: No results for input(s): TSH, T4TOTAL, FREET4, T3FREE, THYROIDAB in the last 72 hours. Anemia Panel: No results for input(s): VITAMINB12, FOLATE, FERRITIN, TIBC, IRON, RETICCTPCT in the last 72 hours. Sepsis Labs: No results for input(s): PROCALCITON, LATICACIDVEN in the last 168 hours.  No results found for this or any previous visit (from the past 240 hour(s)).       Radiology Studies: No results found.      Scheduled Meds: . amiodarone  200 mg Oral Daily  . amLODipine  5 mg Oral Daily  . calcium carbonate  2 tablet Oral Daily  . cholecalciferol  1,000 Units Oral Daily  . mirabegron ER  50 mg Oral Daily  . psyllium  1 packet Oral Daily  . rosuvastatin  40 mg Oral Daily  . triamcinolone cream  1 application Topical BID   Continuous Infusions:   LOS: 2 days    Time spent: 35 min    Hosie Poisson, MD [facility] Pager [phone] If 7PM-7AM, please contact night-coverage [email or web address] Password [email or web address] 03/09/2018, 3:00 PM

## 2018-03-10 DIAGNOSIS — I10 Essential (primary) hypertension: Secondary | ICD-10-CM

## 2018-03-10 DIAGNOSIS — K625 Hemorrhage of anus and rectum: Secondary | ICD-10-CM

## 2018-03-10 LAB — HEMOGLOBIN AND HEMATOCRIT, BLOOD
HCT: 27.1 % — ABNORMAL LOW (ref 39.0–52.0)
HCT: 29.7 % — ABNORMAL LOW (ref 39.0–52.0)
Hemoglobin: 8.2 g/dL — ABNORMAL LOW (ref 13.0–17.0)
Hemoglobin: 9.1 g/dL — ABNORMAL LOW (ref 13.0–17.0)

## 2018-03-10 MED ORDER — GUAIFENESIN-DM 100-10 MG/5ML PO SYRP: 5.0000 mL | ORAL_SOLUTION | ORAL | 0 refills | Status: DC | PRN

## 2018-03-10 MED ORDER — PSYLLIUM 95 % PO PACK: 1.0000 | PACK | Freq: Every day | ORAL | 0 refills | Status: AC

## 2018-03-10 MED ORDER — GUAIFENESIN-DM 100-10 MG/5ML PO SYRP
5.0000 mL | ORAL_SOLUTION | ORAL | Status: DC | PRN
  Administered 2018-03-10: 5 mL via ORAL
  Filled 2018-03-10: qty 10

## 2018-03-10 NOTE — Discharge Instructions (Signed)
Rectal Bleeding Rectal bleeding is when blood passes out of the anus. People with rectal bleeding may notice bright red blood in their underwear or in the toilet after having a bowel movement. They may also have dark red or black stools. Rectal bleeding is usually a sign that something is wrong. Many things can cause rectal bleeding, including:  Hemorrhoids. These are blood vessels in the anus or rectum that are larger than normal.  Fistulas. These are abnormal passages in the rectum and anus.  Anal fissures. This is a tear in the anus.  Diverticulosis. This is a condition in which pockets or sacs project from the bowel.  Proctitis and colitis. These are conditions in which the rectum, colon, or anus become inflamed.  Polyps. These are growths that can be cancerous (malignant) or non-cancerous (benign).  Part of the rectum sticking out from the anus (rectal prolapse).  Certain medicines.  Intestinal infections.  Follow these instructions at home: Pay attention to any changes in your symptoms. Take these actions to help lessen bleeding and discomfort:  Eat a diet that is high in fiber. This will keep your stool soft, making it easier to pass stools without straining. Ask your health care provider what foods and drinks are high in fiber.  Drink enough fluid to keep your urine clear or pale yellow. This also helps to keep your stool soft.  Try taking a warm bath. This may help soothe any pain in your rectum.  Keep all follow-up visits as told by your health care provider. This is important.  Get help right away if:  You have new or increased rectal bleeding.  You have black or dark red stools.  You vomit blood or something that looks like coffee grounds.  You have pain or tenderness in your abdomen.  You have a fever.  You feel weak.  You feel nauseous.  You faint.  You have severe pain in your rectum.  You cannot have a bowel movement. This information is not  intended to replace advice given to you by your health care provider. Make sure you discuss any questions you have with your health care provider. Document Released: 11/21/2001 Document Revised: 11/07/2015 Document Reviewed: 07/28/2015 Elsevier Interactive Patient Education  2018 [employer].   Rectal Bleeding Rectal bleeding is when blood passes out of the anus. People with rectal bleeding may notice bright red blood in their underwear or in the toilet after having a bowel movement. They may also have dark red or black stools. Rectal bleeding is usually a sign that something is wrong. Many things can cause rectal bleeding, including:  Hemorrhoids. These are blood vessels in the anus or rectum that are larger than normal.  Fistulas. These are abnormal passages in the rectum and anus.  Anal fissures. This is a tear in the anus.  Diverticulosis. This is a condition in which pockets or sacs project from the bowel.  Proctitis and colitis. These are conditions in which the rectum, colon, or anus become inflamed.  Polyps. These are growths that can be cancerous (malignant) or non-cancerous (benign).  Part of the rectum sticking out from the anus (rectal prolapse).  Certain medicines.  Intestinal infections.  Follow these instructions at home: Pay attention to any changes in your symptoms. Take these actions to help lessen bleeding and discomfort:  Eat a diet that is high in fiber. This will keep your stool soft, making it easier to pass stools without straining. Ask your health care provider what foods  and drinks are high in fiber.  Drink enough fluid to keep your urine clear or pale yellow. This also helps to keep your stool soft.  Try taking a warm bath. This may help soothe any pain in your rectum.  Keep all follow-up visits as told by your health care provider. This is important.  Get help right away if:  You have new or increased rectal bleeding.  You have black or dark red  stools.  You vomit blood or something that looks like coffee grounds.  You have pain or tenderness in your abdomen.  You have a fever.  You feel weak.  You feel nauseous.  You faint.  You have severe pain in your rectum.  You cannot have a bowel movement. This information is not intended to replace advice given to you by your health care provider. Make sure you discuss any questions you have with your health care provider. Document Released: 11/21/2001 Document Revised: 11/07/2015 Document Reviewed: 07/28/2015 Elsevier Interactive Patient Education  [employer].

## 2018-03-10 NOTE — Discharge Summary (Signed)
Physician Discharge Summary  Evan Dorsey:[id number] DOB: 10-28-1932 DOA: 03/07/2018  PCP: Deland Pretty, MD  Admit date: 03/07/2018 Discharge date: 03/10/2018  Admitted From: Home.  Disposition:  Home.   Recommendations for Outpatient Follow-up:  1. Follow up with PCP in 1-2 weeks 2. Please obtain BMP/CBC in one week 3. Please follow up with Urology as recommended for a PET scan.  4. Please follow up with Gastroenterology if recurrent bleeding occurs.   Home Health:yes   Discharge Condition:stable.  CODE STATUS: FULL Code.  Diet recommendation: Heart Healthy  Brief/Interim Summary: Evan Dorsey a 84 y.o.malewith medical history significant ofcoronary artery disease status post coronary bypass grafting, atrial fibrillation on Eliquis, hypertension history of kidney stone and GERD. He presented secondary to bright red blood per rectum. He had a recent prostate biopsy and is on Eliquis. Hemoglobin has dropped. He had some bleeding overnight, but none today.  Discharge Diagnoses:  Principal Problem:   Postural dizziness with presyncope Active Problems:   HYPERCHOLESTEROLEMIA   HYPERTENSION, BENIGN   CORONARY ATHEROSCLEROSIS NATIVE CORONARY ARTERY   Atrial fibrillation (HCC)   Rectal bleeding  Bright red blood per rectum:  Likely from biopsy.  Eliquis held initially, so far no more bleeding. Resumed eliquis , no more bleeding episodes and hemoglobin stable around 9.   High risk prostate cancer status post biopsy in September 2019.  Recommend outpatient work-up and follow-up with Dr. Tresa Moore   Dizziness:  Resolved.    Acute blood loss anemia:  Baseline hemoglobin is around 11 dropped to around 8, transfuse to keep hemoglobin greater than 7..  Suspect suspect the anemia is from rectal bleeding. On discharge hemoglobin is stable around 9.    Hypertension.  Well-controlled   Atrial  Fibrillation:  rate controlled on amiodarone.  Eliquis for  anticoagulation.   Hyperlipidemia resume statin on discharge.  Discharge Instructions  Discharge Instructions    Diet - low sodium heart healthy   Complete by:  As directed      Allergies as of 03/10/2018   No Known Allergies     Medication List    TAKE these medications   acetaminophen 500 MG tablet Commonly known as:  TYLENOL Take 500 mg by mouth every 6 (six) hours as needed.   amiodarone 200 MG tablet Commonly known as:  PACERONE TAKE ONE TABLET BY MOUTH DAILY   amLODipine 5 MG tablet Commonly known as:  NORVASC TAKE ONE TABLET BY MOUTH EVERY DAY   apixaban 5 MG Tabs tablet Commonly known as:  ELIQUIS Take 1 tablet (5 mg total) by mouth 2 (two) times daily.   bisacodyl 5 MG EC tablet Commonly known as:  DULCOLAX Take 10 mg by mouth daily as needed for moderate constipation.   calcium carbonate 500 MG chewable tablet Commonly known as:  TUMS - dosed in mg elemental calcium Chew 2 tablets by mouth daily.   cholecalciferol 1000 units tablet Commonly known as:  VITAMIN D Take 1,000 Units by mouth daily.   guaiFENesin-dextromethorphan 100-10 MG/5ML syrup Commonly known as:  ROBITUSSIN DM Take 5 mLs by mouth every 4 (four) hours as needed for cough.   MYRBETRIQ 50 MG Tb24 tablet Generic drug:  mirabegron ER Take 50 mg by mouth daily.   NITROSTAT 0.4 MG SL tablet Generic drug:  nitroGLYCERIN PLACE 1 TABLET UNDER THE TONGUE EVERY 5 MINUTES IF NEEDED FOR CHEST PAIN. MAX = 3 TABS/EPISODE.   psyllium 95 % Pack Commonly known as:  HYDROCIL/METAMUCIL Take 1 packet by  mouth daily. Start taking on:  03/11/2018   rosuvastatin 40 MG tablet Commonly known as:  CRESTOR Take 40 mg by mouth daily.   triamcinolone cream 0.1 % Commonly known as:  KENALOG Apply 1 application topically 2 (two) times daily.      Follow-up Information    Deland Pretty, MD. Schedule an appointment as soon as possible for a visit in 1 week(s).   Specialty:  Internal  Medicine Contact information: [address] [place] [address] [phone]        Alexis Frock, MD. Schedule an appointment as soon as possible for a visit in 1 week(s).   Specialty:  Urology Contact information: [place] [address] [phone]          No Known Allergies  Consultations:  Gastroenterology Dr Therisa Doyne  Urology Dr Tresa Moore   Procedures/Studies:  No results found.    Subjective: Reports feeling good, wants to go home, no bleeding episodes.  Discharge Exam: Vitals:   03/10/18 1102 03/10/18 1425  BP: 131/60 (!) 124/54  Pulse: (!) 56 65  Resp: 16 15  Temp: 97.9 F (36.6 C) 98.1 F (36.7 C)  SpO2: 96% 96%   Vitals:   03/09/18 2110 03/10/18 0356 03/10/18 1102 03/10/18 1425  BP: (!) 135/59 (!) 136/55 131/60 (!) 124/54  Pulse: (!) 57 61 (!) 56 65  Resp: 18 18 16 15   Temp: 98.2 F (36.8 C) 98.3 F (36.8 C) 97.9 F (36.6 C) 98.1 F (36.7 C)  TempSrc: Oral Oral Oral Oral  SpO2: 95% 98% 96% 96%  Weight:      Height:        General: Pt is alert, awake, not in acute distress Cardiovascular: RRR, S1/S2 +, no rubs, no gallops Respiratory: CTA bilaterally, no wheezing, no rhonchi Abdominal: Soft, NT, ND, bowel sounds + Extremities: no edema, no cyanosis    The results of significant diagnostics from this hospitalization (including imaging, microbiology, ancillary and laboratory) are listed below for reference.     Microbiology: No results found for this or any previous visit (from the past 240 hour(s)).   Labs: BNP (last 3 results) No results for input(s): BNP in the last 8760 hours. Basic Metabolic Panel: Recent Labs  Lab 03/07/18 1516 03/08/18 0403 03/09/18 0429  NA 143 144 144  K 3.8 3.4* 3.3*  CL 107 114* 115*  CO2 26 21* 23  GLUCOSE 122* 113* 94  BUN 17 17 13   CREATININE 1.32* 1.12 1.19  CALCIUM 9.6 8.4* 8.3*   Liver Function Tests: Recent Labs  Lab 03/07/18 1516 03/08/18 0403   AST 46* 34  ALT 46* 35  ALKPHOS 49 34*  BILITOT 0.9 0.5  PROT 6.7 5.3*  ALBUMIN 3.4* 2.8*   No results for input(s): LIPASE, AMYLASE in the last 168 hours. No results for input(s): AMMONIA in the last 168 hours. CBC: Recent Labs  Lab 03/08/18 0403 03/08/18 1127 03/08/18 1649 03/08/18 2243 03/09/18 0429 03/09/18 1751 03/10/18 0435 03/10/18 1151  WBC 10.6* 11.2* 10.4 11.2* 9.9  --   --   --   HGB 9.6* 9.4* 8.9* 8.3* 8.3* 9.2* 8.2* 9.1*  HCT 31.4* 30.0* 28.7* 27.3* 27.1* 29.9* 27.1* 29.7*  MCV 91.0 89.8 90.0 90.7 90.6  --   --   --   PLT 289 296 296 256 247  --   --   --    Cardiac Enzymes: No results for input(s): CKTOTAL, CKMB, CKMBINDEX, TROPONINI in the last 168  hours. BNP: Invalid input(s): POCBNP CBG: No results for input(s): GLUCAP in the last 168 hours. D-Dimer No results for input(s): DDIMER in the last 72 hours. Hgb A1c No results for input(s): HGBA1C in the last 72 hours. Lipid Profile No results for input(s): CHOL, HDL, LDLCALC, TRIG, CHOLHDL, LDLDIRECT in the last 72 hours. Thyroid function studies No results for input(s): TSH, T4TOTAL, T3FREE, THYROIDAB in the last 72 hours.  Invalid input(s): FREET3 Anemia work up No results for input(s): VITAMINB12, FOLATE, FERRITIN, TIBC, IRON, RETICCTPCT in the last 72 hours. Urinalysis    Component Value Date/Time   COLORURINE YELLOW 10/19/2016 1300   APPEARANCEUR HAZY (A) 10/19/2016 1300   LABSPEC 1.020 10/19/2016 1300   PHURINE 5.0 10/19/2016 1300   GLUCOSEU NEGATIVE 10/19/2016 1300   GLUCOSEU NEGATIVE 04/22/2011 1541   HGBUR LARGE (A) 10/19/2016 1300   BILIRUBINUR NEGATIVE 10/19/2016 1300   KETONESUR 5 (A) 10/19/2016 1300   PROTEINUR 30 (A) 10/19/2016 1300   UROBILINOGEN 0.2 04/22/2011 1541   NITRITE NEGATIVE 10/19/2016 1300   LEUKOCYTESUR TRACE (A) 10/19/2016 1300   Sepsis Labs Invalid input(s): PROCALCITONIN,  WBC,  LACTICIDVEN Microbiology No results found for this or any previous visit (from the  past 240 hour(s)).   Time coordinating discharge:36  minutes  SIGNED:   Hosie Poisson, MD  [facility] 03/10/2018, 6:21 PM Pager   If 7PM-7AM, please contact night-coverage [email or web address] Password [email or web address]

## 2018-03-10 NOTE — Evaluation (Signed)
Physical Therapy Evaluation Patient Details Name: NYLAN NEVEL MRN: [id number] DOB: [birth year] Today's Date: 03/10/2018   History of Present Illness  84 yo male admitted with rectal bleed, presyncope. Hx of L TSA, CAD, CABG, gout, A fib, bladder ca, MI, prostate ca  Clinical Impression  On eval, pt was Min guard-Min assist for mobility. He walked ~150 feet without a device. Unsteady at times requiring small amount of assist to stabilize. No c/o dizziness. Discussed d/c plan-pt will return home with family. Will recommend HHPT f/u to ensure safe transition back into home environment (may only be 1-2 sessions). Will follow during hospital stay. Recommend daily ambulation with nursing supervision, in addition to PT, to increase activity.     Follow Up Recommendations Home health PT    Equipment Recommendations  None recommended by PT    Recommendations for Other Services       Precautions / Restrictions Precautions Precautions: Fall Restrictions Weight Bearing Restrictions: No      Mobility  Bed Mobility Overal bed mobility: Modified Independent                Transfers Overall transfer level: Needs assistance   Transfers: Sit to/from Stand Sit to Stand: Min guard         General transfer comment: close guard for safety. mildly unsteady.   Ambulation/Gait Ambulation/Gait assistance: Min assist;Min guard Gait Distance (Feet): 150 Feet Assistive device: None Gait Pattern/deviations: Step-through pattern;Decreased stride length;Drifts right/left;Staggering right;Staggering left     General Gait Details: Intermittent assist to steady. Pt denied dizziness. He tolerated distance well.   Stairs            Wheelchair Mobility    Modified Rankin (Stroke Patients Only)       Balance Overall balance assessment: Mild deficits observed, not formally tested                                           Pertinent Vitals/Pain Pain  Assessment: No/denies pain    Home Living Family/patient expects to be discharged to:: Private residence Living Arrangements: Spouse/significant other Available Help at Discharge: Family;Available 24 hours/day Type of Home: House Home Access: Stairs to enter   [employer] of Steps: 2 Home Layout: One level Home Equipment: Walker - 2 wheels;Cane - single point      Prior Function Level of Independence: Independent               Hand Dominance        Extremity/Trunk Assessment   Upper Extremity Assessment Upper Extremity Assessment: Overall WFL for tasks assessed    Lower Extremity Assessment Lower Extremity Assessment: Generalized weakness    Cervical / Trunk Assessment Cervical / Trunk Assessment: Normal  Communication   Communication: No difficulties  Cognition Arousal/Alertness: Awake/alert Behavior During Therapy: WFL for tasks assessed/performed Overall Cognitive Status: Within Functional Limits for tasks assessed                                        General Comments      Exercises     Assessment/Plan    PT Assessment Patient needs continued PT services  PT Problem List Decreased mobility;Decreased strength;Decreased balance       PT Treatment Interventions Gait training;Functional mobility training;Therapeutic activities;Balance training;Patient/family education;Therapeutic exercise  PT Goals (Current goals can be found in the Care Plan section)  Acute Rehab PT Goals Patient Stated Goal: home PT Goal Formulation: With patient Time For Goal Achievement: 03/24/18 Potential to Achieve Goals: Good    Frequency Min 3X/week   Barriers to discharge        Co-evaluation               AM-PAC PT "6 Clicks" Daily Activity  Outcome Measure Difficulty turning over in bed (including adjusting bedclothes, sheets and blankets)?: A Little Difficulty moving from lying on back to sitting on the side of the bed? : A  Little Difficulty sitting down on and standing up from a chair with arms (e.g., wheelchair, bedside commode, etc,.)?: A Little Help needed moving to and from a bed to chair (including a wheelchair)?: A Little Help needed walking in hospital room?: A Little Help needed climbing 3-5 steps with a railing? : A Little 6 Click Score: 18    End of Session Equipment Utilized During Treatment: Gait belt Activity Tolerance: Patient tolerated treatment well Patient left: in chair;with call bell/phone within reach;with family/visitor present   PT Visit Diagnosis: Unsteadiness on feet (R26.81);Muscle weakness (generalized) (M62.81)    Time: 7048-8891 PT Time Calculation (min) (ACUTE ONLY): 10 min   Charges:   PT Evaluation $PT Eval Moderate Complexity: [place], PT Acute Rehabilitation Services Pager: [phone] Office: [phone]

## 2018-03-11 ENCOUNTER — Other Ambulatory Visit: Payer: Self-pay | Admitting: Urology

## 2018-03-11 ENCOUNTER — Other Ambulatory Visit: Payer: Self-pay

## 2018-03-11 DIAGNOSIS — C61 Malignant neoplasm of prostate: Secondary | ICD-10-CM

## 2018-03-24 ENCOUNTER — Telehealth: Payer: Self-pay | Admitting: Cardiology

## 2018-03-24 NOTE — Telephone Encounter (Signed)
Patient calling the office for samples of medication: ° ° °1.  What medication and dosage are you requesting samples for?  Eliquis  ° °2.  Are you currently out of this medication? no ° ° °

## 2018-03-24 NOTE — Telephone Encounter (Signed)
Left at the front desk for pick up. [email or web address]  Samples of this drug Eliquis 5mg  bid were given to the patient, quantity 3 boxes, Lot Number [id number]

## 2018-03-25 ENCOUNTER — Encounter (HOSPITAL_COMMUNITY)
Admission: RE | Admit: 2018-03-25 | Discharge: 2018-03-25 | Disposition: A | Discharge status: Home / Self Care | Payer: Medicare Other | Source: Ambulatory Visit | Attending: Urology | Admitting: Urology

## 2018-03-25 DIAGNOSIS — C61 Malignant neoplasm of prostate: Secondary | ICD-10-CM | POA: Diagnosis not present

## 2018-03-25 MED ORDER — TECHNETIUM TC 99M MEDRONATE IV KIT
20.0000 | PACK | Freq: Once | INTRAVENOUS | Status: AC | PRN
  Administered 2018-03-25: 21.4 via INTRAVENOUS

## 2018-03-25 NOTE — Telephone Encounter (Signed)
2nd attempt. Left detailed message on pt voicemail. Samples were left at the front desk for pick up

## 2018-04-18 NOTE — Progress Notes (Signed)
HPI: Followup coronary artery disease status post coronary artery bypassing graft and atrial fibrillation. Patient had previous coronary artery bypassing graft in 1993. Last cardiac catheterization in 2005 showed occluded LAD, circumflex and right coronary artery. LIMA to the LAD was patent, saphenous vein graft to obtuse marginal patent. The saphenous vein graft to the PDA had a severe stenosis but there were collaterals from the obtuse marginal and PLA. Medical therapy recommended. Nuclear study7/17 showed EF 59 and no ischemia.Echo 5/19 showed normal LV function, akinesis of the basal inferior wall, mild diastolic dysfunction, mild aortic stenosis with mean gradient 14 mmHg, mildly dilated ascending aorta measuring 44 mm and mild right ventricular enlargement.  Since he was last seen,patient denies dyspnea, chest pain, palpitations or syncope.  Recently diagnosed with prostate cancer.  Current Outpatient Medications  Medication Sig Dispense Refill  . acetaminophen (TYLENOL) 500 MG tablet Take 500 mg by mouth every 6 (six) hours as needed.    Marland Kitchen amiodarone (PACERONE) 200 MG tablet TAKE ONE TABLET BY MOUTH DAILY 30 tablet 6  . amLODipine (NORVASC) 5 MG tablet TAKE ONE TABLET BY MOUTH EVERY DAY 90 tablet 3  . apixaban (ELIQUIS) 5 MG TABS tablet Take 1 tablet (5 mg total) by mouth 2 (two) times daily. 56 tablet 0  . calcium carbonate (TUMS - DOSED IN MG ELEMENTAL CALCIUM) 500 MG chewable tablet Chew 2 tablets by mouth daily.    . cholecalciferol (VITAMIN D) 1000 units tablet Take 1,000 Units by mouth daily.    Marland Kitchen MYRBETRIQ 50 MG TB24 tablet Take 50 mg by mouth daily.  11  . NITROSTAT 0.4 MG SL tablet PLACE 1 TABLET UNDER THE TONGUE EVERY 5 MINUTES IF NEEDED FOR CHEST PAIN. MAX = 3 TABS/EPISODE. 25 tablet 0  . psyllium (HYDROCIL/METAMUCIL) 95 % PACK Take 1 packet by mouth daily. 240 each 0  . rosuvastatin (CRESTOR) 40 MG tablet Take 40 mg by mouth daily.  3   No current facility-administered  medications for this visit.      Past Medical History:  Diagnosis Date  . Adenomatous polyp   . Arthritis    "left shoulder" (03/02/2013)  . Atrial fibrillation ([place])   . Bladder cancer ([place]) 07/31/2011  . CAD (coronary artery disease)    a. remote CABG b. prior cardiac cath 2005 c. Lexiscan Myoview 07/08/12 w/o evidence of ischemia, EF 53%  . Diverticulosis   . Elevated PSA   . Frequent urination    "day and night" (03/02/2013)  . GERD (gastroesophageal reflux disease)   . Gout    "been a long time ago" (03/02/2013)  . History of kidney stones   . HLD (hyperlipidemia)   . HTN (hypertension)   . Myocardial infarction ([place]) 1990's   silent  . Prostate cancer [facility])     Past Surgical History:  Procedure Laterality Date  . CARDIAC CATHETERIZATION     total occlusion to LAD, LCX, RCA. LIMA patent, normal seq SVG to OM, severe stenosis in SVG to PDA, but collaterals to PLA from OM. RCA went to PDA. treated medically   . CARDIOVASCULAR STRESS TEST  02/10/08   6 mets, poor tolerance, no chest pain   . CATARACT EXTRACTION W/ INTRAOCULAR LENS IMPLANT Right 2014  . CORONARY ARTERY BYPASS GRAFT  08/1991   "CABG X4" (03/02/2013)  . CYSTOSCOPY W/ STONE MANIPULATION    . ORIF ACETABULAR FRACTURE Left 04/2007   trimalleolar fracture  . TOTAL SHOULDER ARTHROPLASTY Left 03/02/2013  . TOTAL  SHOULDER ARTHROPLASTY Left 03/02/2013   Procedure: LEFT TOTAL SHOULDER ARTHROPLASTY;  Surgeon: Marin Shutter, MD;  Location: [place];  Service: Orthopedics;  Laterality: Left;  . TRANSURETHRAL RESECTION OF BLADDER TUMOR  07/31/2011   Procedure: TRANSURETHRAL RESECTION OF BLADDER TUMOR (TURBT);  Surgeon: Claybon Jabs, MD;  Location: [facility];  Service: Urology;  Laterality: N/A;  with [place]  . URETEROSCOPY WITH HOLMIUM LASER LITHOTRIPSY Right 11/06/2016   Procedure: URETEROSCOPY WITH HOLMIUM LASER LITHOTRIPSY/ RETROGRADE PYELOGRAM/ STENT;  Surgeon: Kathie Rhodes, MD;  Location: [facility];  Service:  Urology;  Laterality: Right;    Social History   Socioeconomic History  . Marital status: Married    Spouse name: Not on file  . Number of children: 3  . Years of education: Not on file  . Highest education level: Not on file  Occupational History  . Occupation: retired Psychologist, sport and exercise  Social Needs  . Financial resource strain: Not on file  . Food insecurity:    Worry: Not on file    Inability: Not on file  . Transportation needs:    Medical: Not on file    Non-medical: Not on file  Tobacco Use  . Smoking status: Never Smoker  . Smokeless tobacco: Never Used  Substance and Sexual Activity  . Alcohol use: No  . Drug use: No  . Sexual activity: Not Currently  Lifestyle  . Physical activity:    Days per week: Not on file    Minutes per session: Not on file  . Stress: Not on file  Relationships  . Social connections:    Talks on phone: Not on file    Gets together: Not on file    Attends religious service: Not on file    Active member of club or organization: Not on file    Attends meetings of clubs or organizations: Not on file    Relationship status: Not on file  . Intimate partner violence:    Fear of current or ex partner: Not on file    Emotionally abused: Not on file    Physically abused: Not on file    Forced sexual activity: Not on file  Other Topics Concern  . Not on file  Social History Narrative   Lives at home with wife. Retired Psychologist, sport and exercise.     Family History  Problem Relation Age of Onset  . CAD Father 64  . Prostate cancer Brother   . CAD Brother 59  . CAD Brother 30  . Breast cancer Neg Hx   . Colon cancer Neg Hx   . Pancreatic cancer Neg Hx     ROS: no fevers or chills, productive cough, hemoptysis, dysphasia, odynophagia, melena, hematochezia, dysuria, hematuria, rash, seizure activity, orthopnea, PND, pedal edema, claudication. Remaining systems are negative.  Physical Exam: Well-developed well-nourished in no acute distress.  Skin is warm and dry.   HEENT is normal.  Neck is supple.  Chest is clear to auscultation with normal expansion.  Cardiovascular exam is regular rate and rhythm.  Abdominal exam nontender or distended. No masses palpated. Extremities show no edema. neuro grossly intact  ECG-sinus bradycardia, right bundle branch block, 03/07/2018 personally reviewed  A/P  1 coronary artery disease status post coronary artery bypass graft-patient denies chest pain.  Continue medical therapy with statin.  He is not on aspirin given need for anticoagulation.  2 paroxysmal atrial fibrillation-patient is in sinus rhythm on examination.  Plan to continue amiodarone.  Check TSH, and chest x-ray.  Liver functions September 2019 normal.  Continue apixaban.    3 hypertension-blood pressure is controlled today.  Continue present medications and follow.  4 hyperlipidemia-continue statin.  5 thoracic aortic aneurysm-noted on previous echocardiogram.  Plan CTA to further assess.  6 mild aortic stenosis-he will need follow-up echoes in the future.  [name], MD

## 2018-04-21 ENCOUNTER — Other Ambulatory Visit: Payer: Self-pay

## 2018-04-21 ENCOUNTER — Encounter: Payer: Self-pay | Admitting: Radiation Oncology

## 2018-04-21 ENCOUNTER — Encounter: Payer: Self-pay | Admitting: Medical Oncology

## 2018-04-21 ENCOUNTER — Ambulatory Visit
Admission: RE | Admit: 2018-04-21 | Discharge: 2018-04-21 | Disposition: A | Discharge status: Home / Self Care | Payer: Medicare Other | Source: Ambulatory Visit | Attending: Radiation Oncology | Admitting: Radiation Oncology

## 2018-04-21 VITALS — BP 139/62 | HR 66 | Temp 98.0°F | Resp 20 | Ht 71.0 in | Wt 231.2 lb

## 2018-04-21 DIAGNOSIS — C61 Malignant neoplasm of prostate: Secondary | ICD-10-CM | POA: Insufficient documentation

## 2018-04-21 DIAGNOSIS — I2510 Atherosclerotic heart disease of native coronary artery without angina pectoris: Secondary | ICD-10-CM | POA: Insufficient documentation

## 2018-04-21 DIAGNOSIS — Z79899 Other long term (current) drug therapy: Secondary | ICD-10-CM | POA: Insufficient documentation

## 2018-04-21 DIAGNOSIS — Z8551 Personal history of malignant neoplasm of bladder: Secondary | ICD-10-CM | POA: Diagnosis not present

## 2018-04-21 DIAGNOSIS — I252 Old myocardial infarction: Secondary | ICD-10-CM | POA: Insufficient documentation

## 2018-04-21 DIAGNOSIS — I10 Essential (primary) hypertension: Secondary | ICD-10-CM | POA: Diagnosis not present

## 2018-04-21 DIAGNOSIS — R972 Elevated prostate specific antigen [PSA]: Secondary | ICD-10-CM | POA: Diagnosis not present

## 2018-04-21 DIAGNOSIS — Z7901 Long term (current) use of anticoagulants: Secondary | ICD-10-CM | POA: Diagnosis not present

## 2018-04-21 DIAGNOSIS — Z951 Presence of aortocoronary bypass graft: Secondary | ICD-10-CM | POA: Diagnosis not present

## 2018-04-21 DIAGNOSIS — N401 Enlarged prostate with lower urinary tract symptoms: Secondary | ICD-10-CM | POA: Diagnosis not present

## 2018-04-21 HISTORY — DX: Malignant neoplasm of prostate: C61

## 2018-04-21 NOTE — Progress Notes (Signed)
Radiation Oncology         [phone] ________________________________  Initial Outpatient Consultation  Name: Evan Dorsey MRN: [id number]  Date: 04/21/2018  DOB: 1932/07/09  WU:[id number], Thayer Jew, MD  Deland Pretty, MD   REFERRING PHYSICIAN: Deland Pretty, MD  DIAGNOSIS: 84 y.o. gentleman with Stage T1c adenocarcinoma of the prostate with Gleason score of 4+4, and PSA of 17.2.    ICD-10-CM   1. Malignant neoplasm of prostate [facility]) C61     HISTORY OF PRESENT ILLNESS: Evan Dorsey is an 84 y.o. male with a diagnosis of prostate cancer, originally diagnosed in February 2015. He is an established patient of Dr. 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 Karsten Ro), as well as BPH with outlet obstruction that has been present for several years. He was noted to have a fluctuating, but progressively elevating PSA as high as 10.9, and he subsequently underwent TRUS biopsy on 07/18/2013. This revealed 7/12 cores positive for prostatic adenocarcinoma, with a maximum Gleason score of 4+3 seen in the left apex indicating an intermediate-risk prostate cancer. At that time, he elected to proceed with active surveillance. His PSA has continued to steadily rise since then and most recently was noted to be 17.2 on 01/17/2018. Accordingly, he was evaluated with a digital rectal examination on 01/24/2018 which revealed no prostate nodularity. The patient then proceeded to a second transrectal ultrasound with 12 biopsies of the prostate on 03/01/2018.  The prostate volume measured 65.88 cc.  Out of 12 core biopsies, 10 were positive.  The maximum Gleason score was 4+4, and this was seen in the left base and left base lateral.  Biopsies of prostate revealed:   The patient presented to the ED on 03/07/2018 with significant rectal bleeding that started several days after his prostate biopsy. His Eliquis was held during  that time and was resumed upon discharge from the hospital. He states that he has not had any further rectal bleeding since that time.  Staging studies done on 03/25/2018 include CT abdomen/pelvis and bone scans which were both negative for metastatic disease.  Of note, the patient had cardiac bypass surgery in 1993 and a history of A-fib- on Eliquis long-term.  The patient reviewed the biopsy results with his urologist and he has kindly been referred today for discussion of potential radiation treatment options.  He is accompanied today by his wife and two sons.  PREVIOUS RADIATION THERAPY: No  PAST MEDICAL HISTORY:  Past Medical History:  Diagnosis Date  . Adenomatous polyp   . Arthritis    "left shoulder" (03/02/2013)  . Atrial fibrillation ([place])   . Bladder cancer ([place]) 07/31/2011  . CAD (coronary artery disease)    a. remote CABG b. prior cardiac cath 2005 c. Lexiscan Myoview 07/08/12 w/o evidence of ischemia, EF 53%  . Diverticulosis   . Elevated PSA   . Frequent urination    "day and night" (03/02/2013)  . GERD (gastroesophageal reflux disease)   . Gout    "been a long time ago" (03/02/2013)  . History of kidney stones   . HLD (hyperlipidemia)   . HTN (hypertension)   . Myocardial infarction ([place]) 1990's   silent  . Prostate cancer ([place])       PAST SURGICAL HISTORY: Past Surgical History:  Procedure Laterality Date  . CARDIAC CATHETERIZATION     total occlusion to LAD, LCX, RCA. LIMA patent, normal seq SVG  to OM, severe stenosis in SVG to PDA, but collaterals to PLA from OM. RCA went to PDA. treated medically   . CARDIOVASCULAR STRESS TEST  02/10/08   6 mets, poor tolerance, no chest pain   . CATARACT EXTRACTION W/ INTRAOCULAR LENS IMPLANT Right 2014  . CORONARY ARTERY BYPASS GRAFT  08/1991   "CABG X4" (03/02/2013)  . CYSTOSCOPY W/ STONE MANIPULATION    . ORIF ACETABULAR FRACTURE Left 04/2007   trimalleolar fracture  . TOTAL SHOULDER ARTHROPLASTY Left 03/02/2013  .  TOTAL SHOULDER ARTHROPLASTY Left 03/02/2013   Procedure: LEFT TOTAL SHOULDER ARTHROPLASTY;  Surgeon: Marin Shutter, MD;  Location: [facility];  Service: Orthopedics;  Laterality: Left;  . TRANSURETHRAL RESECTION OF BLADDER TUMOR  07/31/2011   Procedure: TRANSURETHRAL RESECTION OF BLADDER TUMOR (TURBT);  Surgeon: Claybon Jabs, MD;  Location: [facility];  Service: Urology;  Laterality: N/A;  with [place]  . URETEROSCOPY WITH HOLMIUM LASER LITHOTRIPSY Right 11/06/2016   Procedure: URETEROSCOPY WITH HOLMIUM LASER LITHOTRIPSY/ RETROGRADE PYELOGRAM/ STENT;  Surgeon: Kathie Rhodes, MD;  Location: [facility];  Service: Urology;  Laterality: Right;    FAMILY HISTORY:  Family History  Problem Relation Age of Onset  . CAD Father 39  . Prostate cancer Brother   . CAD Brother 66  . CAD Brother 51  . Breast cancer Neg Hx   . Colon cancer Neg Hx   . Pancreatic cancer Neg Hx     SOCIAL HISTORY:  Social History   Socioeconomic History  . Marital status: Married    Spouse name: Not on file  . Number of children: 3  . Years of education: Not on file  . Highest education level: Not on file  Occupational History  . Occupation: retired Psychologist, sport and exercise  Social Needs  . Financial resource strain: Not on file  . Food insecurity:    Worry: Not on file    Inability: Not on file  . Transportation needs:    Medical: Not on file    Non-medical: Not on file  Tobacco Use  . Smoking status: Never Smoker  . Smokeless tobacco: Never Used  Substance and Sexual Activity  . Alcohol use: No  . Drug use: No  . Sexual activity: Not Currently  Lifestyle  . Physical activity:    Days per week: Not on file    Minutes per session: Not on file  . Stress: Not on file  Relationships  . Social connections:    Talks on phone: Not on file    Gets together: Not on file    Attends religious service: Not on file    Active member of club or organization: Not on file    Attends meetings of clubs or organizations: Not  on file    Relationship status: Not on file  . Intimate partner violence:    Fear of current or ex partner: Not on file    Emotionally abused: Not on file    Physically abused: Not on file    Forced sexual activity: Not on file  Other Topics Concern  . Not on file  Social History Narrative   Lives at home with wife. Retired Psychologist, sport and exercise.   Accompanied by family today. Resides in [place], [place].  ALLERGIES: Patient has no known allergies.  MEDICATIONS:  Current Outpatient Medications  Medication Sig Dispense Refill  . acetaminophen (TYLENOL) 500 MG tablet Take 500 mg by mouth every 6 (six) hours as needed.    Marland Kitchen amiodarone (PACERONE) 200 MG  tablet TAKE ONE TABLET BY MOUTH DAILY 30 tablet 6  . amLODipine (NORVASC) 5 MG tablet TAKE ONE TABLET BY MOUTH EVERY DAY 90 tablet 3  . apixaban (ELIQUIS) 5 MG TABS tablet Take 1 tablet (5 mg total) by mouth 2 (two) times daily. 56 tablet 0  . calcium carbonate (TUMS - DOSED IN MG ELEMENTAL CALCIUM) 500 MG chewable tablet Chew 2 tablets by mouth daily.    . cholecalciferol (VITAMIN D) 1000 units tablet Take 1,000 Units by mouth daily.    Marland Kitchen MYRBETRIQ 50 MG TB24 tablet Take 50 mg by mouth daily.  11  . NITROSTAT 0.4 MG SL tablet PLACE 1 TABLET UNDER THE TONGUE EVERY 5 MINUTES IF NEEDED FOR CHEST PAIN. MAX = 3 TABS/EPISODE. 25 tablet 0  . psyllium (HYDROCIL/METAMUCIL) 95 % PACK Take 1 packet by mouth daily. 240 each 0  . rosuvastatin (CRESTOR) 40 MG tablet Take 40 mg by mouth daily.  3  . triamcinolone cream (KENALOG) 0.1 % Apply 1 application topically 2 (two) times daily.  0  . bisacodyl (DULCOLAX) 5 MG EC tablet Take 10 mg by mouth daily as needed for moderate constipation.    Marland Kitchen guaiFENesin-dextromethorphan (ROBITUSSIN DM) 100-10 MG/5ML syrup Take 5 mLs by mouth every 4 (four) hours as needed for cough. (Patient not taking: Reported on 04/21/2018) 118 mL 0   No current facility-administered medications for this encounter.     REVIEW OF SYSTEMS:  On  review of systems, the patient reports that he is doing well overall. He denies any chest pain, shortness of breath, cough, fevers, chills, night sweats, or unintended weight changes. He denies any bowel disturbances, and denies abdominal pain, nausea or vomiting. He reports rectal bleeding has resolved. He denies any new musculoskeletal or joint aches or pains. His IPSS was 8, indicating moderate urinary symptoms with urgency and nocturia every 1-1.5 hours. He denies dysuria, hematuria, leakage or incontinence. His SHIM was 5, indicating he does have erectile dysfunction. A complete review of systems is obtained and is otherwise negative.    PHYSICAL EXAM:  Wt Readings from Last 3 Encounters:  04/21/18 231 lb 3.2 oz (104.9 kg)  03/07/18 226 lb 13.7 oz (102.9 kg)  10/25/17 233 lb (105.7 kg)   Temp Readings from Last 3 Encounters:  04/21/18 98 F (36.7 C) (Oral)  03/10/18 98.1 F (36.7 C) (Oral)  11/06/16 97.7 F (36.5 C)   BP Readings from Last 3 Encounters:  04/21/18 139/62  03/10/18 (!) 124/54  10/25/17 136/72   Pulse Readings from Last 3 Encounters:  04/21/18 66  03/10/18 65  10/25/17 (!) 52   Pain Assessment Pain Score: 0-No pain/10  In general this is a well appearing caucasian male in no acute distress. He is alert and oriented x4 and appropriate throughout the examination. HEENT reveals that the patient is normocephalic, atraumatic. EOMs are intact. PERRLA. Skin is intact without any evidence of gross lesions. Cardiovascular exam reveals a regular rate and rhythm, no clicks rubs or murmurs are auscultated. Chest is clear to auscultation bilaterally. Lymphatic assessment is performed and does not reveal any adenopathy in the cervical, supraclavicular, axillary, or inguinal chains. Abdomen has active bowel sounds in all quadrants and is intact. The abdomen is soft, non tender, non distended. Lower extremities are positive for 1+ pitting edema bilaterally.   KPS = 90  100 -  Normal; no complaints; no evidence of disease. 90   - Able to carry on normal activity; minor signs or symptoms of  disease. 80   - Normal activity with effort; some signs or symptoms of disease. 40   - Cares for self; unable to carry on normal activity or to do active work. 60   - Requires occasional assistance, but is able to care for most of his personal needs. 50   - Requires considerable assistance and frequent medical care. 84   - Disabled; requires special care and assistance. 23   - Severely disabled; hospital admission is indicated although death not imminent. 96   - Very sick; hospital admission necessary; active supportive treatment necessary. 10   - Moribund; fatal processes progressing rapidly. 0     - Dead  Karnofsky DA, Abelmann [place], Craver LS and Burchenal [facility] [phone]) The use of the nitrogen mustards in the palliative treatment of carcinoma: with particular reference to bronchogenic carcinoma Cancer 1 634-56  LABORATORY DATA:  Lab Results  Component Value Date   WBC 9.9 03/09/2018   HGB 9.1 (L) 03/10/2018   HCT 29.7 (L) 03/10/2018   MCV 90.6 03/09/2018   PLT 247 03/09/2018   Lab Results  Component Value Date   NA 144 03/09/2018   K 3.3 (L) 03/09/2018   CL 115 (H) 03/09/2018   CO2 23 03/09/2018   Lab Results  Component Value Date   ALT 35 03/08/2018   AST 34 03/08/2018   ALKPHOS 34 (L) 03/08/2018   BILITOT 0.5 03/08/2018     RADIOGRAPHY: Nm Bone Scan Whole Body  Result Date: 03/25/2018 CLINICAL DATA:  Prostate cancer, PSA 17.2 EXAM: NUCLEAR MEDICINE WHOLE BODY BONE SCAN TECHNIQUE: Whole body anterior and posterior images were obtained approximately 3 hours after intravenous injection of radiopharmaceutical. RADIOPHARMACEUTICALS:  21.4 mCi Technetium-53m MDP IV COMPARISON:  None Correlation: CT abdomen and pelvis 03/25/2018 FINDINGS: Minimal uptake at the shoulders and knees typically degenerative. Single focus of uptake at the LEFT T9 costovertebral junction,  typically degenerative. No additional sites of abnormal osseous tracer accumulation are identified to suggest osseous metastatic disease. Uptake at costal cartilage bilaterally, physiologic. Expected urinary tract and soft tissue distribution of tracer. IMPRESSION: No scintigraphic evidence of osseous metastatic disease. Electronically Signed   By: Lavonia Dana M.D.   On: 03/25/2018 17:54      IMPRESSION/PLAN: 1. 84 y.o. gentleman with Stage T1c adenocarcinoma of the prostate with Gleason Score of 4+4, and PSA of 17.2. We discussed the patient's workup and outlined the nature of prostate cancer in this setting. The patient's T stage, Gleason's score, and PSA put him into the high risk group. Accordingly, he is eligible for a variety of potential treatment options including LT-ADT in combination with either 8 weeks of external radiation or 5 weeks of external radiation followed by a brachytherapy boost. We discussed the available radiation techniques, and focused on the details and logistics and delivery. Given his recent history of rectal hemorrhage following prostate biopsy secondary to the need for chronic anticoagulation, he is not felt to be an ideal candidate for seed boost. Therefore, our recommendation would be to proceed with LT-ADT in combination with 8 weeks of daily radiation therapy which can be delivered at [facility] which is closer to his home in [place]. We discussed and outlined the risks, benefits, short and long-term effects associated with radiotherapy. We also detailed the role of ADT in the treatment of high risk prostate cancer and outlined the associated side effects that could be expected with this therapy.  At the end of the conversation, the patient is interested  in moving forward with LT-ADT in combination with 8 weeks of external beam radiotherapy. At this time, we will coordinate a follow-up with Dr. Karsten Ro in the very near future to start ADT. We will also  coordinate for a consult visit with Dr. Noreene Filbert at [facility] to discuss 8 weeks of external beam radiotherapy closer to home.  We spent 60 minutes face to face with the patient and more than 50% of that time was spent in counseling and/or coordination of care.    Nicholos Johns, PA-C    Tyler Pita, MD  [facility] Direct Dial: [phone]  Fax: [phone] [email or web address]  Skype  [email or web address]  This document serves as a record of services personally performed by Tyler Pita, MD and Freeman Caldron, PA-C. It was created on their behalf by Rae Lips, a trained medical scribe. The creation of this record is based on the scribe's personal observations and the providers' statements to them. This document has been checked and approved by the attending providers.

## 2018-04-21 NOTE — Progress Notes (Signed)
GU Location of Tumor / Histology: prostatic adenocarcinoma  If Prostate Cancer, Gleason Score is (4 + 4) and PSA is (17.2) as of 03/01/2018. Prostate volume: 66 cc.   Evan Dorsey is an 84 y.o. male with a diagnosis of prostate cancer, originally diagnosed in February 2015. He is an established patient of Dr. 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 The patient then proceeded to a second transrectal ultrasound with 12 biopsies of the prostate on 03/01/2018.  Biopsies of prostate (if applicable) revealed:    Past/Anticipated interventions by urology, if any: resection of bladder ca, BPH management, prostate biopsy x 2, CT and bone scan (negative), referral to radiation oncology  Past/Anticipated interventions by medical oncology, if any: no  Weight changes, if any: no  Bowel/Bladder complaints, if any: IPSS 8. SHIM 5. Denies dysuria or hematuria. Reports urinary urgency. Denies urinary leakage or incontinence. Complains of nocturia every 1-1.5.    Nausea/Vomiting, if any: no  Pain issues, if any:  no  SAFETY ISSUES:  Prior radiation? no  Pacemaker/ICD? no  Possible current pregnancy? no  Is the patient on methotrexate? no  Current Complaints / other details:  84 year old male. Married with one daughter. NKDA. Retired Psychologist, sport and exercise. '

## 2018-04-21 NOTE — Progress Notes (Signed)
See progress note under physician encounter. 

## 2018-04-21 NOTE — Progress Notes (Signed)
Introduced myself to Evan Dorsey, his wife and 2 of his sons as the prostate nurse navigator and my role. I gave them my business card and asked them to call with questions or concerns. I will continue to follow.

## 2018-04-22 ENCOUNTER — Telehealth: Payer: Self-pay | Admitting: *Deleted

## 2018-04-22 DIAGNOSIS — C61 Malignant neoplasm of prostate: Secondary | ICD-10-CM | POA: Insufficient documentation

## 2018-04-22 NOTE — Telephone Encounter (Signed)
CALLED PATIENT TO INFORM OF ADT INJ. ON 05-02-18 @ 1:30 PM @ DR. OTTELIN'S OFFICE, LVM FOR A RETURN CALL

## 2018-04-26 ENCOUNTER — Telehealth: Payer: Self-pay | Admitting: *Deleted

## 2018-04-26 NOTE — Telephone Encounter (Signed)
CALLED PATIENT TO INFORM OF APPT. WITH DR. Baruch Gouty ON 04-28-18 @ 2 PM AND HIS ADT INJ. ON 05-02-18 - ARRIVAL TIME - 1:30 PM @ DR. OTTELIN'S OFFICE, SPOKE WITH PATIENT'S WIFE - HANNAH AND SHE IS AWARE OF THESE APPTS.

## 2018-04-27 ENCOUNTER — Ambulatory Visit (INDEPENDENT_AMBULATORY_CARE_PROVIDER_SITE_OTHER): Payer: Medicare Other | Admitting: Cardiology

## 2018-04-27 ENCOUNTER — Encounter: Payer: Self-pay | Admitting: Cardiology

## 2018-04-27 VITALS — BP 122/56 | HR 60 | Ht 71.0 in | Wt 235.0 lb

## 2018-04-27 DIAGNOSIS — I480 Paroxysmal atrial fibrillation: Secondary | ICD-10-CM | POA: Diagnosis not present

## 2018-04-27 DIAGNOSIS — I712 Thoracic aortic aneurysm, without rupture, unspecified: Secondary | ICD-10-CM

## 2018-04-27 DIAGNOSIS — I2510 Atherosclerotic heart disease of native coronary artery without angina pectoris: Secondary | ICD-10-CM | POA: Diagnosis not present

## 2018-04-27 DIAGNOSIS — I10 Essential (primary) hypertension: Secondary | ICD-10-CM

## 2018-04-27 DIAGNOSIS — E7800 Pure hypercholesterolemia, unspecified: Secondary | ICD-10-CM

## 2018-04-27 NOTE — Patient Instructions (Addendum)
Medication Instructions:  Your physician recommends that you continue on your current medications as directed. Please refer to the Current Medication list given to you today.  If you need a refill on your cardiac medications before your next appointment, please call your pharmacy.   Lab work: 1 week prior to CT [employer], [facility]) If you have labs (blood work) drawn today and your tests are completely normal, you will receive your results only by: Marland Kitchen MyChart Message (if you have MyChart) OR . A paper copy in the mail If you have any lab test that is abnormal or we need to change your treatment, we will call you to review the results.  Testing/Procedures: A chest x-ray takes a picture of the organs and structures inside the chest, including the heart, lungs, and blood vessels. This test can show several things, including, whether the heart is enlarges; whether fluid is building up in the lungs; and whether pacemaker / defibrillator leads are still in place.  Non-Cardiac CT scanning, (CAT scanning), is a noninvasive, special x-ray that produces cross-sectional images of the body using x-rays and a computer. CT scans help physicians diagnose and treat medical conditions. For some CT exams, a contrast material is used to enhance visibility in the area of the body being studied. CT scans provide greater clarity and reveal more details than regular x-ray exams.  Follow-Up: At [facility], you and your health needs are our priority.  As part of our continuing mission to provide you with exceptional heart care, we have created designated Provider Care Teams.  These Care Teams include your primary Cardiologist (physician) and Advanced Practice Providers (APPs -  Physician Assistants and Nurse Practitioners) who all work together to provide you with the care you need, when you need it. You will need a follow up appointment in 6 months.  Please call our office 2 months in advance to schedule this appointment.  You  may see [name], MD or one of the following Advanced Practice Providers on your designated Care Team:   Kerin Ransom, PA-C Roby Lofts, [place] . Sande Rives, PA-C

## 2018-04-28 ENCOUNTER — Other Ambulatory Visit: Payer: Self-pay

## 2018-04-28 ENCOUNTER — Ambulatory Visit
Admission: RE | Admit: 2018-04-28 | Discharge: 2018-04-28 | Disposition: A | Discharge status: Home / Self Care | Payer: Medicare Other | Source: Ambulatory Visit | Attending: Radiation Oncology | Admitting: Radiation Oncology

## 2018-04-28 VITALS — BP 129/59 | HR 60 | Resp 18 | Wt 235.5 lb

## 2018-04-28 DIAGNOSIS — C61 Malignant neoplasm of prostate: Secondary | ICD-10-CM

## 2018-04-28 NOTE — Consult Note (Signed)
NEW PATIENT EVALUATION  Name: Evan Dorsey  MRN: [id number]  Date:   04/28/2018     DOB: 07/09/1932   This 84 y.o. 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  REFERRING PHYSICIAN: Deland Pretty, MD  CHIEF COMPLAINT:  Chief Complaint  Patient presents with  . Prostate Cancer    Pt is here for initial consultation     DIAGNOSIS: There were no encounter diagnoses.   PREVIOUS INVESTIGATIONS:  Pathology reports reviewed CT scans and bone scan reviewed Clinical notes reviewed  HPI: patient is a 84 year old male diagnosed back with prostate cancer in February 2015. 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 He was noted to have an elevated PSA back in 2015 up to 10.9 underwent a transrectal ultrasound-guided biopsy showing 7 of 12 cores positive for Gleason 7 (4+3) adenocarcinoma. He decided on active surveillance. His PSA has been rising most recently back in August 2019 was 17.2this prompted another transrectal ultrasound-guided biopsy this time with 10 of 12 cores positive for Gleason 8 (4+4. Patient had  had significant rectal bleeding from his prostate biopsies and is onEliquis. That has subsided.bone scan was negative CT scan of abdomen and pelvis showed no finding suspicious for metastatic disease in the abdomen or pelvis.he has been seen by medical oncology in [place] with recognition for external beam radiation therapy. He is seen today for consideration of treatment. He is doing fairly well does have nocturia 4-5 and some significant frequency and urgency. Bowel function is good.  PLANNED TREATMENT REGIMEN: I MRT radiation therapy to prostate and pelvic nodes  PAST MEDICAL HISTORY:  has a past medical history of Adenomatous polyp,  Arthritis, Atrial fibrillation ([place]), Bladder cancer ([place]) (07/31/2011), CAD (coronary artery disease), Diverticulosis, Elevated PSA, Frequent urination, GERD (gastroesophageal reflux disease), Gout, History of kidney stones, HLD (hyperlipidemia), HTN (hypertension), Myocardial infarction ([place]) (1990's), and Prostate cancer ([place]).    PAST SURGICAL HISTORY:  Past Surgical History:  Procedure Laterality Date  . CARDIAC CATHETERIZATION     total occlusion to LAD, LCX, RCA. LIMA patent, normal seq SVG to OM, severe stenosis in SVG to PDA, but collaterals to PLA from OM. RCA went to PDA. treated medically   . CARDIOVASCULAR STRESS TEST  02/10/08   6 mets, poor tolerance, no chest pain   . CATARACT EXTRACTION W/ INTRAOCULAR LENS IMPLANT Right 2014  . CORONARY ARTERY BYPASS GRAFT  08/1991   "CABG X4" (03/02/2013)  . CYSTOSCOPY W/ STONE MANIPULATION    . ORIF ACETABULAR FRACTURE Left 04/2007   trimalleolar fracture  . TOTAL SHOULDER ARTHROPLASTY Left 03/02/2013  . TOTAL SHOULDER ARTHROPLASTY Left 03/02/2013   Procedure: LEFT TOTAL SHOULDER ARTHROPLASTY;  Surgeon: Marin Shutter, MD;  Location: [facility];  Service: Orthopedics;  Laterality: Left;  . TRANSURETHRAL RESECTION OF BLADDER TUMOR  07/31/2011   Procedure: TRANSURETHRAL RESECTION OF BLADDER TUMOR (TURBT);  Surgeon: Claybon Jabs, MD;  Location: [facility];  Service: Urology;  Laterality: N/A;  with Gyrus  . URETEROSCOPY WITH HOLMIUM LASER LITHOTRIPSY Right 11/06/2016   Procedure: URETEROSCOPY WITH HOLMIUM LASER LITHOTRIPSY/ RETROGRADE PYELOGRAM/ STENT;  Surgeon: Kathie Rhodes, MD;  Location: [facility];  Service: Urology;  Laterality: Right;    FAMILY HISTORY: family history includes CAD (age of onset: 39) in  his father; CAD (age of onset: 76) in his brother; CAD (age of onset: 30) in his brother; Prostate cancer in his brother.  SOCIAL HISTORY:  reports that he has never smoked. He has never used smokeless tobacco. He reports that he does  not drink alcohol or use drugs.  ALLERGIES: Patient has no known allergies.  MEDICATIONS:  Current Outpatient Medications  Medication Sig Dispense Refill  . acetaminophen (TYLENOL) 500 MG tablet Take 500 mg by mouth every 6 (six) hours as needed.    Marland Kitchen amiodarone (PACERONE) 200 MG tablet TAKE ONE TABLET BY MOUTH DAILY 30 tablet 6  . amLODipine (NORVASC) 5 MG tablet TAKE ONE TABLET BY MOUTH EVERY DAY 90 tablet 3  . apixaban (ELIQUIS) 5 MG TABS tablet Take 1 tablet (5 mg total) by mouth 2 (two) times daily. 56 tablet 0  . calcium carbonate (TUMS - DOSED IN MG ELEMENTAL CALCIUM) 500 MG chewable tablet Chew 2 tablets by mouth daily.    . cholecalciferol (VITAMIN D) 1000 units tablet Take 1,000 Units by mouth daily.    Marland Kitchen MYRBETRIQ 50 MG TB24 tablet Take 50 mg by mouth daily.  11  . NITROSTAT 0.4 MG SL tablet PLACE 1 TABLET UNDER THE TONGUE EVERY 5 MINUTES IF NEEDED FOR CHEST PAIN. MAX = 3 TABS/EPISODE. 25 tablet 0  . psyllium (HYDROCIL/METAMUCIL) 95 % PACK Take 1 packet by mouth daily. 240 each 0  . rosuvastatin (CRESTOR) 40 MG tablet Take 40 mg by mouth daily.  3   No current facility-administered medications for this encounter.     ECOG PERFORMANCE STATUS:  1 - Symptomatic but completely ambulatory  REVIEW OF SYSTEMS:  Patient denies any weight loss, fatigue, weakness, fever, chills or night sweats. Patient denies any loss of vision, blurred vision. Patient denies any ringing  of the ears or hearing loss. No irregular heartbeat. Patient denies heart murmur or history of fainting. Patient denies any chest pain or pain radiating to her upper extremities. Patient denies any shortness of breath, difficulty breathing at night, cough or hemoptysis. Patient denies any swelling in the lower legs. Patient denies any nausea vomiting, vomiting of blood, or coffee ground material in the vomitus. Patient denies any stomach pain. Patient states has had normal bowel movements no significant constipation or  diarrhea. Patient denies any dysuria, hematuria or significant nocturia. Patient denies any problems walking, swelling in the joints or loss of balance. Patient denies any skin changes, loss of hair or loss of weight. Patient denies any excessive worrying or anxiety or significant depression. Patient denies any problems with insomnia. Patient denies excessive thirst, polyuria, polydipsia. Patient denies any swollen glands, patient denies easy bruising or easy bleeding. Patient denies any recent infections, allergies or URI. Patient "s visual fields have not changed significantly in recent time.    PHYSICAL EXAM: BP (!) 129/59 (BP Location: Left Arm, Patient Position: Sitting)   Pulse 60   Resp 18   Wt 235 lb 7.2 oz (106.8 kg)   BMI 32.84 kg/m  On rectal exam rectal sphincter tone is good prostate is smooth without evidence of mass or nodularity sulcus is preserved bilaterally. No other rectal abnormality is identified.Well-developed well-nourished patient in NAD. HEENT reveals PERLA, EOMI, discs not visualized.  Oral cavity is clear. No oral mucosal lesions are identified. Neck is clear without evidence of cervical or supraclavicular adenopathy. Lungs are clear to A&P. Cardiac examination is essentially unremarkable with regular rate and rhythm without murmur rub or thrill. Abdomen is benign  with no organomegaly or masses noted. Motor sensory and DTR levels are equal and symmetric in the upper and lower extremities. Cranial nerves II through XII are grossly intact. Proprioception is intact. No peripheral adenopathy or edema is identified. No motor or sensory levels are noted. Crude visual fields are within normal range.  LABORATORY DATA: pathology reports reviewed    RADIOLOGY RESULTS:CT scans of the abdomen and pelvis and bone scan reviewed and compatible with the above-stated findings   IMPRESSION: stage IIB (T1 CN 0 M0) Gleason 8 (4+4) adenocarcinoma the prostate in 84 year old male  PLAN: I  agree with Dr. Johny Shears assessment and recommendations. I would plan on delivering 8000 cGy over 8 weeks to his prostate and delivering 5400 cGy to his pelvic nodes using I MRT dose painting technique. Based on his history of rectal bleeding with his biopsies would not recommend a fiducial marker placement. Risks and benefits of treatment including increased lower urinary tract symptoms diarrhea fatigue skin reaction and alteration of blood counts all were discussed with the patient and his wife.I have personally set up and ordered CT simulation on the patient. Patient comprehends our treatment plan well. Patient also is to be seen early next week to initiate androgen deprivation therapy which I would recommend for at least a year and a half to 2 years.  I would like to take this opportunity to thank you for allowing me to participate in the care of your patient.Noreene Filbert, MD

## 2018-05-02 DIAGNOSIS — R351 Nocturia: Secondary | ICD-10-CM | POA: Diagnosis not present

## 2018-05-02 DIAGNOSIS — R8271 Bacteriuria: Secondary | ICD-10-CM | POA: Diagnosis not present

## 2018-05-02 DIAGNOSIS — C61 Malignant neoplasm of prostate: Secondary | ICD-10-CM | POA: Diagnosis not present

## 2018-05-04 ENCOUNTER — Ambulatory Visit
Admission: RE | Admit: 2018-05-04 | Discharge: 2018-05-04 | Disposition: A | Discharge status: Home / Self Care | Payer: Medicare Other | Source: Ambulatory Visit | Attending: Radiation Oncology | Admitting: Radiation Oncology

## 2018-05-04 DIAGNOSIS — C61 Malignant neoplasm of prostate: Secondary | ICD-10-CM | POA: Insufficient documentation

## 2018-05-06 DIAGNOSIS — C61 Malignant neoplasm of prostate: Secondary | ICD-10-CM | POA: Diagnosis not present

## 2018-05-09 ENCOUNTER — Ambulatory Visit
Admission: RE | Admit: 2018-05-09 | Discharge: 2018-05-09 | Disposition: A | Discharge status: Home / Self Care | Payer: Medicare Other | Source: Ambulatory Visit | Attending: Cardiology | Admitting: Cardiology

## 2018-05-09 ENCOUNTER — Other Ambulatory Visit: Payer: Self-pay | Admitting: *Deleted

## 2018-05-09 DIAGNOSIS — I4891 Unspecified atrial fibrillation: Secondary | ICD-10-CM | POA: Diagnosis not present

## 2018-05-09 DIAGNOSIS — C61 Malignant neoplasm of prostate: Secondary | ICD-10-CM

## 2018-05-09 DIAGNOSIS — I480 Paroxysmal atrial fibrillation: Secondary | ICD-10-CM

## 2018-05-09 DIAGNOSIS — I2510 Atherosclerotic heart disease of native coronary artery without angina pectoris: Secondary | ICD-10-CM | POA: Diagnosis not present

## 2018-05-09 DIAGNOSIS — I10 Essential (primary) hypertension: Secondary | ICD-10-CM | POA: Diagnosis not present

## 2018-05-10 LAB — BASIC METABOLIC PANEL
BUN/Creatinine Ratio: 9 — ABNORMAL LOW (ref 10–24)
BUN: 12 mg/dL (ref 8–27)
CHLORIDE: 106 mmol/L (ref 96–106)
CO2: 19 mmol/L — ABNORMAL LOW (ref 20–29)
Calcium: 9.2 mg/dL (ref 8.6–10.2)
Creatinine, Ser: 1.31 mg/dL — ABNORMAL HIGH (ref 0.76–1.27)
GFR calc non Af Amer: 50 mL/min/{1.73_m2} — ABNORMAL LOW (ref >59)
GFR, EST AFRICAN AMERICAN: 58 mL/min/{1.73_m2} — AB (ref >59)
GLUCOSE: 89 mg/dL (ref 65–99)
POTASSIUM: 4.2 mmol/L (ref 3.5–5.2)
SODIUM: 140 mmol/L (ref 134–144)

## 2018-05-10 LAB — TSH: TSH: 3.38 u[IU]/mL (ref 0.450–4.500)

## 2018-05-11 ENCOUNTER — Encounter: Payer: Self-pay | Admitting: *Deleted

## 2018-05-17 ENCOUNTER — Ambulatory Visit
Admission: RE | Admit: 2018-05-17 | Discharge: 2018-05-17 | Disposition: A | Discharge status: Home / Self Care | Payer: Medicare Other | Source: Ambulatory Visit | Attending: Radiation Oncology | Admitting: Radiation Oncology

## 2018-05-17 ENCOUNTER — Ambulatory Visit (INDEPENDENT_AMBULATORY_CARE_PROVIDER_SITE_OTHER)
Admission: RE | Admit: 2018-05-17 | Discharge: 2018-05-17 | Disposition: A | Discharge status: Home / Self Care | Payer: Medicare Other | Source: Ambulatory Visit | Attending: Cardiology | Admitting: Cardiology

## 2018-05-17 DIAGNOSIS — I712 Thoracic aortic aneurysm, without rupture, unspecified: Secondary | ICD-10-CM

## 2018-05-17 DIAGNOSIS — C61 Malignant neoplasm of prostate: Secondary | ICD-10-CM | POA: Insufficient documentation

## 2018-05-17 MED ORDER — IOPAMIDOL (ISOVUE-370) INJECTION 76%
100.0000 mL | Freq: Once | INTRAVENOUS | Status: AC | PRN
  Administered 2018-05-17: 100 mL via INTRAVENOUS

## 2018-05-18 ENCOUNTER — Ambulatory Visit
Admission: RE | Admit: 2018-05-18 | Discharge: 2018-05-18 | Disposition: A | Discharge status: Home / Self Care | Payer: Medicare Other | Source: Ambulatory Visit | Attending: Radiation Oncology | Admitting: Radiation Oncology

## 2018-05-18 DIAGNOSIS — C61 Malignant neoplasm of prostate: Secondary | ICD-10-CM | POA: Diagnosis not present

## 2018-05-19 ENCOUNTER — Ambulatory Visit
Admission: RE | Admit: 2018-05-19 | Discharge: 2018-05-19 | Disposition: A | Discharge status: Home / Self Care | Payer: Medicare Other | Source: Ambulatory Visit | Attending: Radiation Oncology | Admitting: Radiation Oncology

## 2018-05-19 DIAGNOSIS — C61 Malignant neoplasm of prostate: Secondary | ICD-10-CM | POA: Diagnosis not present

## 2018-05-20 ENCOUNTER — Ambulatory Visit
Admission: RE | Admit: 2018-05-20 | Discharge: 2018-05-20 | Disposition: A | Discharge status: Home / Self Care | Payer: Medicare Other | Source: Ambulatory Visit | Attending: Radiation Oncology | Admitting: Radiation Oncology

## 2018-05-20 DIAGNOSIS — C61 Malignant neoplasm of prostate: Secondary | ICD-10-CM | POA: Diagnosis not present

## 2018-05-23 ENCOUNTER — Ambulatory Visit
Admission: RE | Admit: 2018-05-23 | Discharge: 2018-05-23 | Disposition: A | Discharge status: Home / Self Care | Payer: Medicare Other | Source: Ambulatory Visit | Attending: Radiation Oncology | Admitting: Radiation Oncology

## 2018-05-23 DIAGNOSIS — C61 Malignant neoplasm of prostate: Secondary | ICD-10-CM | POA: Diagnosis not present

## 2018-05-24 ENCOUNTER — Ambulatory Visit
Admission: RE | Admit: 2018-05-24 | Discharge: 2018-05-24 | Disposition: A | Discharge status: Home / Self Care | Payer: Medicare Other | Source: Ambulatory Visit | Attending: Radiation Oncology | Admitting: Radiation Oncology

## 2018-05-24 DIAGNOSIS — C61 Malignant neoplasm of prostate: Secondary | ICD-10-CM | POA: Diagnosis not present

## 2018-05-25 ENCOUNTER — Ambulatory Visit
Admission: RE | Admit: 2018-05-25 | Discharge: 2018-05-25 | Disposition: A | Discharge status: Home / Self Care | Payer: Medicare Other | Source: Ambulatory Visit | Attending: Radiation Oncology | Admitting: Radiation Oncology

## 2018-05-25 DIAGNOSIS — C61 Malignant neoplasm of prostate: Secondary | ICD-10-CM | POA: Diagnosis not present

## 2018-05-26 ENCOUNTER — Ambulatory Visit
Admission: RE | Admit: 2018-05-26 | Discharge: 2018-05-26 | Disposition: A | Discharge status: Home / Self Care | Payer: Medicare Other | Source: Ambulatory Visit | Attending: Radiation Oncology | Admitting: Radiation Oncology

## 2018-05-26 DIAGNOSIS — C61 Malignant neoplasm of prostate: Secondary | ICD-10-CM | POA: Diagnosis not present

## 2018-05-27 ENCOUNTER — Ambulatory Visit
Admission: RE | Admit: 2018-05-27 | Discharge: 2018-05-27 | Disposition: A | Discharge status: Home / Self Care | Payer: Medicare Other | Source: Ambulatory Visit | Attending: Radiation Oncology | Admitting: Radiation Oncology

## 2018-05-27 DIAGNOSIS — C61 Malignant neoplasm of prostate: Secondary | ICD-10-CM | POA: Diagnosis not present

## 2018-05-30 ENCOUNTER — Ambulatory Visit
Admission: RE | Admit: 2018-05-30 | Discharge: 2018-05-30 | Disposition: A | Discharge status: Home / Self Care | Payer: Medicare Other | Source: Ambulatory Visit | Attending: Radiation Oncology | Admitting: Radiation Oncology

## 2018-05-30 DIAGNOSIS — C61 Malignant neoplasm of prostate: Secondary | ICD-10-CM | POA: Diagnosis not present

## 2018-05-31 ENCOUNTER — Ambulatory Visit
Admission: RE | Admit: 2018-05-31 | Discharge: 2018-05-31 | Disposition: A | Discharge status: Home / Self Care | Payer: Medicare Other | Source: Ambulatory Visit | Attending: Radiation Oncology | Admitting: Radiation Oncology

## 2018-05-31 DIAGNOSIS — C61 Malignant neoplasm of prostate: Secondary | ICD-10-CM | POA: Diagnosis not present

## 2018-06-01 ENCOUNTER — Ambulatory Visit
Admission: RE | Admit: 2018-06-01 | Discharge: 2018-06-01 | Disposition: A | Discharge status: Home / Self Care | Payer: Medicare Other | Source: Ambulatory Visit | Attending: Radiation Oncology | Admitting: Radiation Oncology

## 2018-06-01 DIAGNOSIS — C61 Malignant neoplasm of prostate: Secondary | ICD-10-CM | POA: Diagnosis not present

## 2018-06-02 ENCOUNTER — Inpatient Hospital Stay: Payer: Medicare Other | Attending: Radiation Oncology

## 2018-06-02 ENCOUNTER — Ambulatory Visit
Admission: RE | Admit: 2018-06-02 | Discharge: 2018-06-02 | Disposition: A | Discharge status: Home / Self Care | Payer: Medicare Other | Source: Ambulatory Visit | Attending: Radiation Oncology | Admitting: Radiation Oncology

## 2018-06-02 DIAGNOSIS — C61 Malignant neoplasm of prostate: Secondary | ICD-10-CM | POA: Diagnosis not present

## 2018-06-02 LAB — CBC
HEMATOCRIT: 29.4 % — AB (ref 39.0–52.0)
Hemoglobin: 7.7 g/dL — ABNORMAL LOW (ref 13.0–17.0)
MCH: 19.6 pg — ABNORMAL LOW (ref 26.0–34.0)
MCHC: 26.2 g/dL — ABNORMAL LOW (ref 30.0–36.0)
MCV: 74.8 fL — AB (ref 80.0–100.0)
PLATELETS: 246 10*3/uL (ref 150–400)
RBC: 3.93 MIL/uL — ABNORMAL LOW (ref 4.22–5.81)
RDW: 21.4 % — AB (ref 11.5–15.5)
WBC: 6.3 10*3/uL (ref 4.0–10.5)
nRBC: 0 % (ref 0.0–0.2)

## 2018-06-03 ENCOUNTER — Ambulatory Visit
Admission: RE | Admit: 2018-06-03 | Discharge: 2018-06-03 | Disposition: A | Discharge status: Home / Self Care | Payer: Medicare Other | Source: Ambulatory Visit | Attending: Radiation Oncology | Admitting: Radiation Oncology

## 2018-06-03 DIAGNOSIS — C61 Malignant neoplasm of prostate: Secondary | ICD-10-CM | POA: Diagnosis not present

## 2018-06-06 ENCOUNTER — Ambulatory Visit
Admission: RE | Admit: 2018-06-06 | Discharge: 2018-06-06 | Disposition: A | Discharge status: Home / Self Care | Payer: Medicare Other | Source: Ambulatory Visit | Attending: Radiation Oncology | Admitting: Radiation Oncology

## 2018-06-06 DIAGNOSIS — C61 Malignant neoplasm of prostate: Secondary | ICD-10-CM | POA: Diagnosis not present

## 2018-06-07 ENCOUNTER — Other Ambulatory Visit: Payer: Self-pay

## 2018-06-07 ENCOUNTER — Ambulatory Visit
Admission: RE | Admit: 2018-06-07 | Discharge: 2018-06-07 | Disposition: A | Discharge status: Home / Self Care | Payer: Medicare Other | Source: Ambulatory Visit | Attending: Radiation Oncology | Admitting: Radiation Oncology

## 2018-06-07 DIAGNOSIS — C61 Malignant neoplasm of prostate: Secondary | ICD-10-CM | POA: Diagnosis not present

## 2018-06-09 ENCOUNTER — Ambulatory Visit
Admission: RE | Admit: 2018-06-09 | Discharge: 2018-06-09 | Disposition: A | Discharge status: Home / Self Care | Payer: Medicare Other | Source: Ambulatory Visit | Attending: Radiation Oncology | Admitting: Radiation Oncology

## 2018-06-09 DIAGNOSIS — C61 Malignant neoplasm of prostate: Secondary | ICD-10-CM | POA: Diagnosis not present

## 2018-06-10 ENCOUNTER — Other Ambulatory Visit: Payer: Self-pay

## 2018-06-10 ENCOUNTER — Ambulatory Visit
Admission: RE | Admit: 2018-06-10 | Discharge: 2018-06-10 | Disposition: A | Discharge status: Home / Self Care | Payer: Medicare Other | Source: Ambulatory Visit | Attending: Radiation Oncology | Admitting: Radiation Oncology

## 2018-06-10 DIAGNOSIS — C61 Malignant neoplasm of prostate: Secondary | ICD-10-CM | POA: Diagnosis not present

## 2018-06-10 MED ORDER — NITROGLYCERIN 0.4 MG SL SUBL: SUBLINGUAL_TABLET | SUBLINGUAL | 4 refills | Status: AC

## 2018-06-13 ENCOUNTER — Ambulatory Visit
Admission: RE | Admit: 2018-06-13 | Discharge: 2018-06-13 | Disposition: A | Discharge status: Home / Self Care | Payer: Medicare Other | Source: Ambulatory Visit | Attending: Radiation Oncology | Admitting: Radiation Oncology

## 2018-06-13 DIAGNOSIS — C61 Malignant neoplasm of prostate: Secondary | ICD-10-CM | POA: Diagnosis not present

## 2018-06-14 ENCOUNTER — Ambulatory Visit
Admission: RE | Admit: 2018-06-14 | Discharge: 2018-06-14 | Disposition: A | Discharge status: Home / Self Care | Payer: Medicare Other | Source: Ambulatory Visit | Attending: Radiation Oncology | Admitting: Radiation Oncology

## 2018-06-14 DIAGNOSIS — C61 Malignant neoplasm of prostate: Secondary | ICD-10-CM | POA: Diagnosis not present

## 2018-06-16 ENCOUNTER — Ambulatory Visit
Admission: RE | Admit: 2018-06-16 | Discharge: 2018-06-16 | Disposition: A | Discharge status: Home / Self Care | Payer: Medicare Other | Source: Ambulatory Visit | Attending: Radiation Oncology | Admitting: Radiation Oncology

## 2018-06-16 ENCOUNTER — Inpatient Hospital Stay: Payer: Medicare Other | Attending: Radiation Oncology

## 2018-06-16 ENCOUNTER — Other Ambulatory Visit: Payer: Self-pay

## 2018-06-16 DIAGNOSIS — C61 Malignant neoplasm of prostate: Secondary | ICD-10-CM | POA: Insufficient documentation

## 2018-06-16 DIAGNOSIS — I10 Essential (primary) hypertension: Secondary | ICD-10-CM | POA: Insufficient documentation

## 2018-06-16 DIAGNOSIS — D500 Iron deficiency anemia secondary to blood loss (chronic): Secondary | ICD-10-CM | POA: Insufficient documentation

## 2018-06-16 DIAGNOSIS — Z7901 Long term (current) use of anticoagulants: Secondary | ICD-10-CM | POA: Insufficient documentation

## 2018-06-16 DIAGNOSIS — I4891 Unspecified atrial fibrillation: Secondary | ICD-10-CM | POA: Diagnosis not present

## 2018-06-16 LAB — CBC
HEMATOCRIT: 27.9 % — AB (ref 39.0–52.0)
Hemoglobin: 7.4 g/dL — ABNORMAL LOW (ref 13.0–17.0)
MCH: 20.2 pg — AB (ref 26.0–34.0)
MCHC: 26.5 g/dL — AB (ref 30.0–36.0)
MCV: 76 fL — ABNORMAL LOW (ref 80.0–100.0)
Platelets: 275 10*3/uL (ref 150–400)
RBC: 3.67 MIL/uL — ABNORMAL LOW (ref 4.22–5.81)
RDW: 22.9 % — AB (ref 11.5–15.5)
WBC: 6.3 10*3/uL (ref 4.0–10.5)
nRBC: 0 % (ref 0.0–0.2)

## 2018-06-17 ENCOUNTER — Ambulatory Visit
Admission: RE | Admit: 2018-06-17 | Discharge: 2018-06-17 | Disposition: A | Discharge status: Home / Self Care | Payer: Medicare Other | Source: Ambulatory Visit | Attending: Radiation Oncology | Admitting: Radiation Oncology

## 2018-06-17 DIAGNOSIS — C61 Malignant neoplasm of prostate: Secondary | ICD-10-CM | POA: Diagnosis not present

## 2018-06-20 ENCOUNTER — Ambulatory Visit
Admission: RE | Admit: 2018-06-20 | Discharge: 2018-06-20 | Disposition: A | Discharge status: Home / Self Care | Payer: Medicare Other | Source: Ambulatory Visit | Attending: Radiation Oncology | Admitting: Radiation Oncology

## 2018-06-20 DIAGNOSIS — C61 Malignant neoplasm of prostate: Secondary | ICD-10-CM | POA: Diagnosis not present

## 2018-06-21 ENCOUNTER — Ambulatory Visit
Admission: RE | Admit: 2018-06-21 | Discharge: 2018-06-21 | Disposition: A | Discharge status: Home / Self Care | Payer: Medicare Other | Source: Ambulatory Visit | Attending: Radiation Oncology | Admitting: Radiation Oncology

## 2018-06-21 ENCOUNTER — Other Ambulatory Visit: Payer: Self-pay | Admitting: *Deleted

## 2018-06-21 DIAGNOSIS — C61 Malignant neoplasm of prostate: Secondary | ICD-10-CM

## 2018-06-22 ENCOUNTER — Ambulatory Visit
Admission: RE | Admit: 2018-06-22 | Discharge: 2018-06-22 | Disposition: A | Discharge status: Home / Self Care | Payer: Medicare Other | Source: Ambulatory Visit | Attending: Radiation Oncology | Admitting: Radiation Oncology

## 2018-06-22 DIAGNOSIS — C61 Malignant neoplasm of prostate: Secondary | ICD-10-CM | POA: Diagnosis not present

## 2018-06-23 ENCOUNTER — Other Ambulatory Visit: Payer: Self-pay | Admitting: *Deleted

## 2018-06-23 ENCOUNTER — Ambulatory Visit
Admission: RE | Admit: 2018-06-23 | Discharge: 2018-06-23 | Disposition: A | Discharge status: Home / Self Care | Payer: Medicare Other | Source: Ambulatory Visit | Attending: Radiation Oncology | Admitting: Radiation Oncology

## 2018-06-23 DIAGNOSIS — C61 Malignant neoplasm of prostate: Secondary | ICD-10-CM | POA: Diagnosis not present

## 2018-06-23 MED ORDER — TAMSULOSIN HCL 0.4 MG PO CAPS: 0.4000 mg | ORAL_CAPSULE | Freq: Every day | ORAL | 6 refills | Status: DC

## 2018-06-24 ENCOUNTER — Ambulatory Visit
Admission: RE | Admit: 2018-06-24 | Discharge: 2018-06-24 | Disposition: A | Discharge status: Home / Self Care | Payer: Medicare Other | Source: Ambulatory Visit | Attending: Radiation Oncology | Admitting: Radiation Oncology

## 2018-06-24 ENCOUNTER — Other Ambulatory Visit: Payer: Self-pay | Admitting: Cardiology

## 2018-06-24 DIAGNOSIS — C61 Malignant neoplasm of prostate: Secondary | ICD-10-CM | POA: Diagnosis not present

## 2018-06-24 NOTE — Telephone Encounter (Signed)
Notified patient's wife no eliquis samples are available. She will call next week to check on them

## 2018-06-24 NOTE — Telephone Encounter (Signed)
Patient calling the office for samples of medication:   1.  What medication and dosage are you requesting samples for? Eliquis  2.  Are you currently out of this medication?  A few

## 2018-06-27 ENCOUNTER — Ambulatory Visit
Admission: RE | Admit: 2018-06-27 | Discharge: 2018-06-27 | Disposition: A | Discharge status: Home / Self Care | Payer: Medicare Other | Source: Ambulatory Visit | Attending: Radiation Oncology | Admitting: Radiation Oncology

## 2018-06-27 DIAGNOSIS — C61 Malignant neoplasm of prostate: Secondary | ICD-10-CM | POA: Diagnosis not present

## 2018-06-28 ENCOUNTER — Inpatient Hospital Stay (HOSPITAL_BASED_OUTPATIENT_CLINIC_OR_DEPARTMENT_OTHER): Payer: Medicare Other | Admitting: Oncology

## 2018-06-28 ENCOUNTER — Encounter: Payer: Self-pay | Admitting: Oncology

## 2018-06-28 ENCOUNTER — Ambulatory Visit
Admission: RE | Admit: 2018-06-28 | Discharge: 2018-06-28 | Disposition: A | Discharge status: Home / Self Care | Payer: Medicare Other | Source: Ambulatory Visit | Attending: Radiation Oncology | Admitting: Radiation Oncology

## 2018-06-28 ENCOUNTER — Other Ambulatory Visit: Payer: Self-pay | Admitting: *Deleted

## 2018-06-28 ENCOUNTER — Inpatient Hospital Stay: Payer: Medicare Other

## 2018-06-28 VITALS — BP 111/58 | HR 60 | Wt 238.2 lb

## 2018-06-28 DIAGNOSIS — D509 Iron deficiency anemia, unspecified: Secondary | ICD-10-CM

## 2018-06-28 DIAGNOSIS — Z7901 Long term (current) use of anticoagulants: Secondary | ICD-10-CM | POA: Diagnosis not present

## 2018-06-28 DIAGNOSIS — I4891 Unspecified atrial fibrillation: Secondary | ICD-10-CM | POA: Diagnosis not present

## 2018-06-28 DIAGNOSIS — D500 Iron deficiency anemia secondary to blood loss (chronic): Secondary | ICD-10-CM

## 2018-06-28 DIAGNOSIS — I10 Essential (primary) hypertension: Secondary | ICD-10-CM

## 2018-06-28 DIAGNOSIS — C61 Malignant neoplasm of prostate: Secondary | ICD-10-CM | POA: Diagnosis not present

## 2018-06-28 LAB — CBC WITH DIFFERENTIAL/PLATELET
ABS IMMATURE GRANULOCYTES: 0.03 10*3/uL (ref 0.00–0.07)
BASOS PCT: 0 %
Basophils Absolute: 0 10*3/uL (ref 0.0–0.1)
Eosinophils Absolute: 0 10*3/uL (ref 0.0–0.5)
Eosinophils Relative: 1 %
HCT: 26.9 % — ABNORMAL LOW (ref 39.0–52.0)
Hemoglobin: 7.1 g/dL — ABNORMAL LOW (ref 13.0–17.0)
Immature Granulocytes: 0 %
Lymphocytes Relative: 9 %
Lymphs Abs: 0.7 10*3/uL (ref 0.7–4.0)
MCH: 20.3 pg — AB (ref 26.0–34.0)
MCHC: 26.4 g/dL — AB (ref 30.0–36.0)
MCV: 76.9 fL — AB (ref 80.0–100.0)
MONO ABS: 1.4 10*3/uL — AB (ref 0.1–1.0)
Monocytes Relative: 19 %
NEUTROS ABS: 5 10*3/uL (ref 1.7–7.7)
NEUTROS PCT: 71 %
Platelets: 250 10*3/uL (ref 150–400)
RBC: 3.5 MIL/uL — AB (ref 4.22–5.81)
RDW: 23.2 % — ABNORMAL HIGH (ref 11.5–15.5)
WBC: 7.2 10*3/uL (ref 4.0–10.5)
nRBC: 0 % (ref 0.0–0.2)

## 2018-06-28 LAB — FERRITIN: FERRITIN: 6 ng/mL — AB (ref 24–336)

## 2018-06-28 LAB — COMPREHENSIVE METABOLIC PANEL
ALT: 23 U/L (ref 0–44)
ANION GAP: 8 (ref 5–15)
AST: 28 U/L (ref 15–41)
Albumin: 3.6 g/dL (ref 3.5–5.0)
Alkaline Phosphatase: 53 U/L (ref 38–126)
BILIRUBIN TOTAL: 0.6 mg/dL (ref 0.3–1.2)
BUN: 19 mg/dL (ref 8–23)
CHLORIDE: 109 mmol/L (ref 98–111)
CO2: 26 mmol/L (ref 22–32)
Calcium: 9.4 mg/dL (ref 8.9–10.3)
Creatinine, Ser: 1.23 mg/dL (ref 0.61–1.24)
GFR, EST NON AFRICAN AMERICAN: 54 mL/min — AB (ref >60)
Glucose, Bld: 114 mg/dL — ABNORMAL HIGH (ref 70–99)
POTASSIUM: 3.6 mmol/L (ref 3.5–5.1)
Sodium: 143 mmol/L (ref 135–145)
TOTAL PROTEIN: 6.5 g/dL (ref 6.5–8.1)

## 2018-06-28 LAB — IRON AND TIBC
IRON: 19 ug/dL — AB (ref 45–182)
SATURATION RATIOS: 4 % — AB (ref 17.9–39.5)
TIBC: 439 ug/dL (ref 250–450)
UIBC: 420 ug/dL

## 2018-06-28 LAB — TECHNOLOGIST SMEAR REVIEW

## 2018-06-29 ENCOUNTER — Telehealth: Payer: Self-pay | Admitting: Cardiology

## 2018-06-29 ENCOUNTER — Ambulatory Visit
Admission: RE | Admit: 2018-06-29 | Discharge: 2018-06-29 | Disposition: A | Discharge status: Home / Self Care | Payer: Medicare Other | Source: Ambulatory Visit | Attending: Radiation Oncology | Admitting: Radiation Oncology

## 2018-06-29 ENCOUNTER — Encounter: Payer: Self-pay | Admitting: Oncology

## 2018-06-29 DIAGNOSIS — D500 Iron deficiency anemia secondary to blood loss (chronic): Secondary | ICD-10-CM

## 2018-06-29 DIAGNOSIS — C61 Malignant neoplasm of prostate: Secondary | ICD-10-CM | POA: Diagnosis not present

## 2018-06-29 HISTORY — DX: Iron deficiency anemia secondary to blood loss (chronic): D50.0

## 2018-06-29 NOTE — Progress Notes (Signed)
Hematology/Oncology Consult note [facility] [phone] [phone]   Patient Care Team: Deland Pretty, MD as PCP - General (Internal Medicine) Stanford Breed Denice Bors, MD as PCP - Cardiology (Cardiology) Deland Pretty, MD (Internal Medicine)  REFERRING PROVIDER: Dr.Chrystal CHIEF COMPLAINTS/REASON FOR VISIT:  Evaluation of anemia  HISTORY OF PRESENTING ILLNESS:  Evan Dorsey is a  85 y.o.  male with PMH listed below who was referred to me for evaluation of anemia Patient was seen by radiation oncology Dr. Baruch Gouty for management of prostate cancer. 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  He was noted to have rising PSA in 2015 up to 10.9, he underwent transrectal ultrasound-guided biopsy showing a 7 of 12 cores positive for Gleason 7 (4+3)adenocarcinoma. Patient decided on active surveillance.  PSA has been rising most recently in August 2019 which was 17.2.  This prompted another trans-rectal ultrasound-guided biopsy in September 2019 and at this time with 10 out of 12 cores positive for Gleason 8 (4+4). Patient is on Eliquis for anticoagulation for atrial fibrillation had significant bleeding from prostate biopsy.  Bone scan on 03/25/2018 showed negative for osseous metastasis. 03/25/2018 CT abdomen pelvis showed no findings highly suspicious for metastatic disease in the abdomen or pelvis.  Scattered small low-attenuation liver mass are indeterminate in density and appear stable since Oct 19, 2016 CT.  More likely benign.  Mild to moderate prostato megaly Patient has been seen by medical oncology in [place] with recommendation of external beam radiation therapy. Patient has been started on radiation treatment. Was noticed that patient's hemoglobin progressively worsened.  He was referred to hematology for further management. Reviewed patient's recent labs on  06/16/2018 revealed anemia with hemoglobin of 7.4 with MCV of 76. Reviewed patient's previous labs ordered by primary care physician's office, anemia is acute onset, duration is since September 2019 when he had excessive bleeding after prostate biopsy.   Associated signs and symptoms: Patient reports fatigue.  Mild SOB with exertion.  Denies weight loss, easy bruising, hematochezia, hemoptysis, hematuria. Context: History of GI bleeding: Denies Currently on prostate radiation, with urinary symptoms including increased frequency and urgency of urination.  No dysuria.  Constipated. Patient is accompanied by wife.   Review of Systems  Constitutional: Positive for fatigue. Negative for appetite change, chills, fever and unexpected weight change.  HENT:   Negative for hearing loss and voice change.   Eyes: Negative for eye problems and icterus.  Respiratory: Positive for shortness of breath. Negative for chest tightness and cough.   Cardiovascular: Negative for chest pain and leg swelling.  Gastrointestinal: Negative for abdominal distention and abdominal pain.  Endocrine: Negative for hot flashes.  Genitourinary: Positive for frequency. Negative for difficulty urinating and dysuria.   Musculoskeletal: Negative for arthralgias.  Skin: Negative for itching and rash.  Neurological: Negative for light-headedness and numbness.  Hematological: Negative for adenopathy. Does not bruise/bleed easily.  Psychiatric/Behavioral: Negative for confusion.   MEDICAL HISTORY:  Past Medical History:  Diagnosis Date  . Adenomatous polyp   . Arthritis    "left shoulder" (03/02/2013)  . Atrial fibrillation ([place])   . Bladder cancer ([place]) 07/31/2011  . CAD (coronary artery disease)    a. remote CABG b. prior cardiac cath 2005 c. Lexiscan Myoview 07/08/12 w/o evidence of ischemia, EF 53%  . Diverticulosis   . Elevated PSA   . Frequent urination    "day and night" (03/02/2013)  .  GERD (gastroesophageal reflux  disease)   . Gout    "been a long time ago" (03/02/2013)  . History of kidney stones   . HLD (hyperlipidemia)   . HTN (hypertension)   . Iron deficiency anemia due to chronic blood loss 06/29/2018  . Myocardial infarction ([place]) 1990's   silent  . Prostate cancer [facility])     SURGICAL HISTORY: Past Surgical History:  Procedure Laterality Date  . CARDIAC CATHETERIZATION     total occlusion to LAD, LCX, RCA. LIMA patent, normal seq SVG to OM, severe stenosis in SVG to PDA, but collaterals to PLA from OM. RCA went to PDA. treated medically   . CARDIOVASCULAR STRESS TEST  02/10/08   6 mets, poor tolerance, no chest pain   . CATARACT EXTRACTION W/ INTRAOCULAR LENS IMPLANT Right 2014  . CORONARY ARTERY BYPASS GRAFT  08/1991   "CABG X4" (03/02/2013)  . CYSTOSCOPY W/ STONE MANIPULATION    . ORIF ACETABULAR FRACTURE Left 04/2007   trimalleolar fracture  . TOTAL SHOULDER ARTHROPLASTY Left 03/02/2013  . TOTAL SHOULDER ARTHROPLASTY Left 03/02/2013   Procedure: LEFT TOTAL SHOULDER ARTHROPLASTY;  Surgeon: Marin Shutter, MD;  Location: [facility];  Service: Orthopedics;  Laterality: Left;  . TRANSURETHRAL RESECTION OF BLADDER TUMOR  07/31/2011   Procedure: TRANSURETHRAL RESECTION OF BLADDER TUMOR (TURBT);  Surgeon: Claybon Jabs, MD;  [facility];  Service: Urology;  Laterality: N/A;  with Gyrus  . URETEROSCOPY WITH HOLMIUM LASER LITHOTRIPSY Right 11/06/2016   Procedure: URETEROSCOPY WITH HOLMIUM LASER LITHOTRIPSY/ RETROGRADE PYELOGRAM/ STENT;  Surgeon: Kathie Rhodes, MD;  Location: [facility];  Service: Urology;  Laterality: Right;    SOCIAL HISTORY: Social History   Socioeconomic History  . Marital status: Married    Spouse name: Not on file  . Number of children: 3  . Years of education: Not on file  . Highest education level: Not on file  Occupational History  . Occupation: retired Psychologist, sport and exercise  Social Needs  . Financial resource strain: Not on file  . Food insecurity:    Worry:  Not on file    Inability: Not on file  . Transportation needs:    Medical: Not on file    Non-medical: Not on file  Tobacco Use  . Smoking status: Never Smoker  . Smokeless tobacco: Never Used  Substance and Sexual Activity  . Alcohol use: No  . Drug use: No  . Sexual activity: Not Currently  Lifestyle  . Physical activity:    Days per week: Not on file    Minutes per session: Not on file  . Stress: Not on file  Relationships  . Social connections:    Talks on phone: Not on file    Gets together: Not on file    Attends religious service: Not on file    Active member of club or organization: Not on file    Attends meetings of clubs or organizations: Not on file    Relationship status: Not on file  . Intimate partner violence:    Fear of current or ex partner: Not on file    Emotionally abused: Not on file    Physically abused: Not on file    Forced sexual activity: Not on file  Other Topics Concern  . Not on file  Social History Narrative   Lives at home with wife. Retired Psychologist, sport and exercise.     FAMILY HISTORY: Family History  Problem Relation Age of Onset  . CAD Father 72  .  Prostate cancer Brother   . CAD Brother 91  . CAD Brother 48  . Breast cancer Neg Hx   . Colon cancer Neg Hx   . Pancreatic cancer Neg Hx     ALLERGIES:  has No Known Allergies.  MEDICATIONS:  Current Outpatient Medications  Medication Sig Dispense Refill  . acetaminophen (TYLENOL) 500 MG tablet Take 500 mg by mouth every 6 (six) hours as needed.    Marland Kitchen amiodarone (PACERONE) 200 MG tablet TAKE ONE TABLET BY MOUTH DAILY 30 tablet 6  . amLODipine (NORVASC) 5 MG tablet TAKE ONE TABLET BY MOUTH EVERY DAY 90 tablet 3  . apixaban (ELIQUIS) 5 MG TABS tablet Take 1 tablet (5 mg total) by mouth 2 (two) times daily. 56 tablet 0  . calcium carbonate (TUMS - DOSED IN MG ELEMENTAL CALCIUM) 500 MG chewable tablet Chew 2 tablets by mouth daily.    . cholecalciferol (VITAMIN D) 1000 units tablet Take 1,000 Units by  mouth daily.    Marland Kitchen MYRBETRIQ 50 MG TB24 tablet Take 50 mg by mouth daily.  11  . nitroGLYCERIN (NITROSTAT) 0.4 MG SL tablet PLACE 1 TABLET UNDER THE TONGUE EVERY 5 MINUTES IF NEEDED FOR CHEST PAIN. MAX = 3 TABS/EPISODE. 25 tablet 4  . psyllium (HYDROCIL/METAMUCIL) 95 % PACK Take 1 packet by mouth daily. 240 each 0  . rosuvastatin (CRESTOR) 40 MG tablet Take 40 mg by mouth daily.  3  . tamsulosin (FLOMAX) 0.4 MG CAPS capsule Take 1 capsule (0.4 mg total) by mouth daily after supper. 30 capsule 6   No current facility-administered medications for this visit.      PHYSICAL EXAMINATION: ECOG PERFORMANCE STATUS: 2 - Symptomatic, <50% confined to bed Vitals:   06/28/18 1414  BP: (!) 111/58  Pulse: 60   Filed Weights   06/28/18 1414  Weight: 238 lb 3.3 oz (108 kg)    Physical Exam Constitutional:      General: He is not in acute distress. HENT:     Head: Normocephalic and atraumatic.  Eyes:     General: No scleral icterus.    Pupils: Pupils are equal, round, and reactive to light.  Neck:     Musculoskeletal: Normal range of motion and neck supple.  Cardiovascular:     Rate and Rhythm: Normal rate and regular rhythm.     Heart sounds: Normal heart sounds.  Pulmonary:     Effort: Pulmonary effort is normal. No respiratory distress.     Breath sounds: No wheezing.  Abdominal:     General: Bowel sounds are normal. There is no distension.     Palpations: Abdomen is soft. There is no mass.     Tenderness: There is no abdominal tenderness.  Musculoskeletal: Normal range of motion.        General: No deformity.  Skin:    General: Skin is warm and dry.     Coloration: Skin is pale.     Findings: No erythema or rash.  Neurological:     Mental Status: He is alert and oriented to person, place, and time.     Cranial Nerves: No cranial nerve deficit.     Coordination: Coordination normal.  Psychiatric:        Behavior: Behavior normal.        Thought Content: Thought content normal.       LABORATORY DATA:  I have reviewed the data as listed Lab Results  Component Value Date   WBC 7.2 06/28/2018   HGB  7.1 (L) 06/28/2018   HCT 26.9 (L) 06/28/2018   MCV 76.9 (L) 06/28/2018   PLT 250 06/28/2018   Recent Labs    03/07/18 1516 03/08/18 0403 03/09/18 0429 05/09/18 1032 06/28/18 1445  NA 143 144 144 140 143  K 3.8 3.4* 3.3* 4.2 3.6  CL 107 114* 115* 106 109  CO2 26 21* 23 19* 26  GLUCOSE 122* 113* 94 89 114*  BUN 17 17 13 12 19   CREATININE 1.32* 1.12 1.19 1.31* 1.23  CALCIUM 9.6 8.4* 8.3* 9.2 9.4  GFRNONAA 48* 59* 55* 50* 54*  GFRAA 56* >60 >60 58* >60  PROT 6.7 5.3*  --   --  6.5  ALBUMIN 3.4* 2.8*  --   --  3.6  AST 46* 34  --   --  28  ALT 46* 35  --   --  23  ALKPHOS 49 34*  --   --  53  BILITOT 0.9 0.5  --   --  0.6   Iron/TIBC/Ferritin/ %Sat    Component Value Date/Time   IRON 19 (L) 06/28/2018 1445   TIBC 439 06/28/2018 1445   FERRITIN 6 (L) 06/28/2018 1445   IRONPCTSAT 4 (L) 06/28/2018 1445     RADIOGRAPHIC STUDIES: I have personally reviewed the radiological images as listed and agreed with the findings in the report. 05/17/2018 CT angio chest IMPRESSION: 1. No overt aneurysmal disease of the ascending thoracic aorta by CTA measurement. The ascending aorta is top normal in caliber and measures 3.9 cm in greatest diameter. 2. Normal variant separate origin of the left vertebral artery off of the aortic arch. Bone scan on 03/25/2018 showed negative for osseous metastasis. 03/25/2018 CT abdomen pelvis showed no findings highly suspicious for metastatic disease in the abdomen or pelvis.  Scattered small low-attenuation liver mass are indeterminate in density and appear stable since Oct 19, 2016 CT.  More likely benign.  Mild to moderate prostato megaly   ASSESSMENT & PLAN:  1. Microcytic anemia   2. Iron deficiency anemia due to chronic blood loss   3. Prostate cancer [facility])    Labs reviewed and discussed with patient.  Microcytic  anemia, with onset timing after significant post biopsy bleeding.  Most likely iron deficient anemia secondary to recent blood loss. Recommend checking CBC, CMP, iron, TIBC, ferritin, blood smear. Labs reviewed and patient has a ferritin of 6, TIBC 439, saturation ratio 4 Plan IV iron with Venofer 200mg  weekly x 4 doses. Allergy reactions/infusion reaction including anaphylactic reaction discussed with patient. Other side effects include but not limited to high blood pressure, skin rash, weight gain, leg swelling, etc. Patient voices understanding and willing to proceed.  # Prostate cancer, he has been seen by Dr.Manning who recommend ADT in combination with 8 weeks of external beam radiotherapy. Patient follows up with Dr.Ottelin for ADT treatment . Patient is getting RT by Dr.Chrystal at [facility] which is closer to his home.   Orders Placed This Encounter  Procedures  . CBC with Differential/Platelet    Standing Status:   Future    Number of Occurrences:   1    Standing Expiration Date:   06/29/2019  . Technologist smear review    Standing Status:   Future    Number of Occurrences:   1    Standing Expiration Date:   06/29/2019  . Comprehensive metabolic panel    Standing Status:   Future    Number of Occurrences:   1  Standing Expiration Date:   06/29/2019  . Iron and TIBC    Standing Status:   Future    Number of Occurrences:   1    Standing Expiration Date:   06/29/2019  . Ferritin    Standing Status:   Future    Number of Occurrences:   1    Standing Expiration Date:   06/29/2019    All questions were answered. The patient knows to call the clinic with any problems questions or concerns.  Return of visit:6 weeks with repeat labs[CBC, iron, TIBC, ferritin, prior] +/- Venofer Thank you for this kind referral and the opportunity to participate in the care of this patient. A copy of today's note is routed to referring provider  Total face to face encounter time for this patient visit was  22min. >50% of the time was  spent in counseling and coordination of care.    Earlie Server, MD, PhD Hematology Oncology [facility] Pager- [phone] .06/28/2017

## 2018-06-29 NOTE — Telephone Encounter (Signed)
New message ° ° ° °Patient calling the office for samples of medication: ° ° °1.  What medication and dosage are you requesting samples for? apixaban (ELIQUIS) 5 MG TABS tablet ° °2.  Are you currently out of this medication? no ° ° °

## 2018-06-29 NOTE — Telephone Encounter (Signed)
Called patient advised that samples were available, to come pick them up. They were placed up front.

## 2018-06-30 ENCOUNTER — Inpatient Hospital Stay: Payer: Medicare Other

## 2018-06-30 ENCOUNTER — Telehealth: Payer: Self-pay | Admitting: *Deleted

## 2018-06-30 ENCOUNTER — Ambulatory Visit
Admission: RE | Admit: 2018-06-30 | Discharge: 2018-06-30 | Disposition: A | Discharge status: Home / Self Care | Payer: Medicare Other | Source: Ambulatory Visit | Attending: Radiation Oncology | Admitting: Radiation Oncology

## 2018-06-30 DIAGNOSIS — C61 Malignant neoplasm of prostate: Secondary | ICD-10-CM | POA: Diagnosis not present

## 2018-06-30 NOTE — Telephone Encounter (Signed)
Evan Feil called asking about blood results on low hgb, No one has contacted then,. Please advise.  Notes recorded by Vernetta Honey, CMA on 06/29/2018 at 3:08 PM EST Message sent to scheduling ------  Notes recorded by Earlie Server, MD on 06/29/2018 at 12:07 AM EST Please schedule patient to have IV venofer weekly x 4.  Lab [cbc, iron TIBC, ferritin] - MD +/- Venofer in 6 weeks. Thanks   Ref Range & Units 2d ago  Ferritin 24 - 336 ng/mL 6Low    Comment: Performed at [facility], [address] [place], [place] [address]  Resulting Agency  [facility] LAB      Specimen Collected: 06/28/18 14:45  Last Resulted: 06/28/18 16:37       anemia   Ref Range & Units 2d ago  Iron 45 - 182 ug/dL 19Low    TIBC 250 - 450 ug/dL 439   Saturation Ratios 17.9 - 39.5 % 4Low    UIBC ug/dL 420   Comment: Performed at [facility], [address], [place], [place] [address]  Resulting Agency  [facility]      Specimen Collected: 06/28/18 14:45  Last Resulted: 06/28/18 16:37

## 2018-07-01 ENCOUNTER — Ambulatory Visit
Admission: RE | Admit: 2018-07-01 | Discharge: 2018-07-01 | Disposition: A | Discharge status: Home / Self Care | Payer: Medicare Other | Source: Ambulatory Visit | Attending: Radiation Oncology | Admitting: Radiation Oncology

## 2018-07-01 DIAGNOSIS — C61 Malignant neoplasm of prostate: Secondary | ICD-10-CM | POA: Diagnosis not present

## 2018-07-04 ENCOUNTER — Inpatient Hospital Stay: Payer: Medicare Other

## 2018-07-04 ENCOUNTER — Ambulatory Visit
Admission: RE | Admit: 2018-07-04 | Discharge: 2018-07-04 | Disposition: A | Discharge status: Home / Self Care | Payer: Medicare Other | Source: Ambulatory Visit | Attending: Radiation Oncology | Admitting: Radiation Oncology

## 2018-07-04 ENCOUNTER — Other Ambulatory Visit: Payer: Self-pay | Admitting: Cardiology

## 2018-07-04 VITALS — BP 111/59 | HR 62 | Resp 18

## 2018-07-04 DIAGNOSIS — I4891 Unspecified atrial fibrillation: Secondary | ICD-10-CM | POA: Diagnosis not present

## 2018-07-04 DIAGNOSIS — C61 Malignant neoplasm of prostate: Secondary | ICD-10-CM | POA: Diagnosis not present

## 2018-07-04 DIAGNOSIS — I10 Essential (primary) hypertension: Secondary | ICD-10-CM | POA: Diagnosis not present

## 2018-07-04 DIAGNOSIS — D500 Iron deficiency anemia secondary to blood loss (chronic): Secondary | ICD-10-CM | POA: Diagnosis not present

## 2018-07-04 DIAGNOSIS — Z7901 Long term (current) use of anticoagulants: Secondary | ICD-10-CM | POA: Diagnosis not present

## 2018-07-04 MED ORDER — SODIUM CHLORIDE 0.9 % IV SOLN: 200.0000 mg | Freq: Once | INTRAVENOUS | Status: DC

## 2018-07-04 MED ORDER — SODIUM CHLORIDE 0.9 % IV SOLN
Freq: Once | INTRAVENOUS | Status: AC
  Administered 2018-07-04: 14:00:00 via INTRAVENOUS
  Filled 2018-07-04: qty 250

## 2018-07-04 MED ORDER — IRON SUCROSE 20 MG/ML IV SOLN
200.0000 mg | Freq: Once | INTRAVENOUS | Status: AC
  Administered 2018-07-04: 200 mg via INTRAVENOUS
  Filled 2018-07-04: qty 10

## 2018-07-04 NOTE — Telephone Encounter (Signed)
Rx has been sent to the pharmacy electronically. ° °

## 2018-07-05 ENCOUNTER — Ambulatory Visit
Admission: RE | Admit: 2018-07-05 | Discharge: 2018-07-05 | Disposition: A | Discharge status: Home / Self Care | Payer: Medicare Other | Source: Ambulatory Visit | Attending: Radiation Oncology | Admitting: Radiation Oncology

## 2018-07-05 DIAGNOSIS — C61 Malignant neoplasm of prostate: Secondary | ICD-10-CM | POA: Diagnosis not present

## 2018-07-05 NOTE — Telephone Encounter (Signed)
Has he got IV venofer scheduled?

## 2018-07-05 NOTE — Telephone Encounter (Signed)
Yes he got it yesterday and is scheduled for the 27th again

## 2018-07-06 ENCOUNTER — Ambulatory Visit
Admission: RE | Admit: 2018-07-06 | Discharge: 2018-07-06 | Disposition: A | Discharge status: Home / Self Care | Payer: Medicare Other | Source: Ambulatory Visit | Attending: Radiation Oncology | Admitting: Radiation Oncology

## 2018-07-06 DIAGNOSIS — C61 Malignant neoplasm of prostate: Secondary | ICD-10-CM | POA: Diagnosis not present

## 2018-07-07 ENCOUNTER — Ambulatory Visit
Admission: RE | Admit: 2018-07-07 | Discharge: 2018-07-07 | Disposition: A | Discharge status: Home / Self Care | Payer: Medicare Other | Source: Ambulatory Visit | Attending: Radiation Oncology | Admitting: Radiation Oncology

## 2018-07-07 DIAGNOSIS — C61 Malignant neoplasm of prostate: Secondary | ICD-10-CM | POA: Diagnosis not present

## 2018-07-08 ENCOUNTER — Ambulatory Visit
Admission: RE | Admit: 2018-07-08 | Discharge: 2018-07-08 | Disposition: A | Discharge status: Home / Self Care | Payer: Medicare Other | Source: Ambulatory Visit | Attending: Radiation Oncology | Admitting: Radiation Oncology

## 2018-07-08 DIAGNOSIS — C61 Malignant neoplasm of prostate: Secondary | ICD-10-CM | POA: Diagnosis not present

## 2018-07-09 ENCOUNTER — Encounter: Payer: Self-pay | Admitting: Oncology

## 2018-07-09 DIAGNOSIS — Z7189 Other specified counseling: Secondary | ICD-10-CM | POA: Insufficient documentation

## 2018-07-11 ENCOUNTER — Ambulatory Visit
Admission: RE | Admit: 2018-07-11 | Discharge: 2018-07-11 | Disposition: A | Discharge status: Home / Self Care | Payer: Medicare Other | Source: Ambulatory Visit | Attending: Radiation Oncology | Admitting: Radiation Oncology

## 2018-07-11 ENCOUNTER — Inpatient Hospital Stay: Payer: Medicare Other

## 2018-07-11 DIAGNOSIS — C61 Malignant neoplasm of prostate: Secondary | ICD-10-CM | POA: Diagnosis not present

## 2018-07-12 ENCOUNTER — Ambulatory Visit
Admission: RE | Admit: 2018-07-12 | Discharge: 2018-07-12 | Disposition: A | Discharge status: Home / Self Care | Payer: Medicare Other | Source: Ambulatory Visit | Attending: Oncology | Admitting: Oncology

## 2018-07-12 ENCOUNTER — Other Ambulatory Visit: Payer: Self-pay | Admitting: *Deleted

## 2018-07-12 ENCOUNTER — Other Ambulatory Visit: Payer: Self-pay | Admitting: Oncology

## 2018-07-12 ENCOUNTER — Encounter: Payer: Self-pay | Admitting: Oncology

## 2018-07-12 ENCOUNTER — Ambulatory Visit
Admission: RE | Admit: 2018-07-12 | Discharge: 2018-07-12 | Disposition: A | Discharge status: Home / Self Care | Payer: Medicare Other | Source: Ambulatory Visit | Attending: Radiation Oncology | Admitting: Radiation Oncology

## 2018-07-12 ENCOUNTER — Inpatient Hospital Stay (HOSPITAL_BASED_OUTPATIENT_CLINIC_OR_DEPARTMENT_OTHER): Payer: Medicare Other | Admitting: Oncology

## 2018-07-12 ENCOUNTER — Inpatient Hospital Stay: Payer: Medicare Other

## 2018-07-12 ENCOUNTER — Other Ambulatory Visit: Payer: Self-pay

## 2018-07-12 VITALS — BP 146/74 | HR 54 | Temp 97.6°F | Resp 20 | Wt 243.9 lb

## 2018-07-12 DIAGNOSIS — R0602 Shortness of breath: Secondary | ICD-10-CM

## 2018-07-12 DIAGNOSIS — C61 Malignant neoplasm of prostate: Secondary | ICD-10-CM | POA: Diagnosis not present

## 2018-07-12 DIAGNOSIS — I10 Essential (primary) hypertension: Secondary | ICD-10-CM | POA: Diagnosis not present

## 2018-07-12 DIAGNOSIS — Z7901 Long term (current) use of anticoagulants: Secondary | ICD-10-CM | POA: Diagnosis not present

## 2018-07-12 DIAGNOSIS — I4891 Unspecified atrial fibrillation: Secondary | ICD-10-CM

## 2018-07-12 DIAGNOSIS — R600 Localized edema: Secondary | ICD-10-CM | POA: Diagnosis not present

## 2018-07-12 DIAGNOSIS — D509 Iron deficiency anemia, unspecified: Secondary | ICD-10-CM

## 2018-07-12 DIAGNOSIS — D649 Anemia, unspecified: Secondary | ICD-10-CM

## 2018-07-12 DIAGNOSIS — D500 Iron deficiency anemia secondary to blood loss (chronic): Secondary | ICD-10-CM

## 2018-07-12 LAB — COMPREHENSIVE METABOLIC PANEL
ALBUMIN: 3.6 g/dL (ref 3.5–5.0)
ALT: 23 U/L (ref 0–44)
AST: 29 U/L (ref 15–41)
Alkaline Phosphatase: 54 U/L (ref 38–126)
Anion gap: 8 (ref 5–15)
BILIRUBIN TOTAL: 0.7 mg/dL (ref 0.3–1.2)
BUN: 14 mg/dL (ref 8–23)
CO2: 26 mmol/L (ref 22–32)
Calcium: 9.3 mg/dL (ref 8.9–10.3)
Chloride: 109 mmol/L (ref 98–111)
Creatinine, Ser: 1.19 mg/dL (ref 0.61–1.24)
GFR calc Af Amer: >60 mL/min (ref >60)
GFR calc non Af Amer: 56 mL/min — ABNORMAL LOW (ref >60)
Glucose, Bld: 105 mg/dL — ABNORMAL HIGH (ref 70–99)
Potassium: 3.5 mmol/L (ref 3.5–5.1)
Sodium: 143 mmol/L (ref 135–145)
Total Protein: 6.7 g/dL (ref 6.5–8.1)

## 2018-07-12 LAB — SAMPLE TO BLOOD BANK

## 2018-07-12 LAB — CBC WITH DIFFERENTIAL/PLATELET
Abs Immature Granulocytes: 0.02 10*3/uL (ref 0.00–0.07)
BASOS ABS: 0 10*3/uL (ref 0.0–0.1)
Basophils Relative: 0 %
Eosinophils Absolute: 0.1 10*3/uL (ref 0.0–0.5)
Eosinophils Relative: 1 %
HEMATOCRIT: 29 % — AB (ref 39.0–52.0)
HEMOGLOBIN: 7.6 g/dL — AB (ref 13.0–17.0)
Immature Granulocytes: 0 %
LYMPHS PCT: 7 %
Lymphs Abs: 0.4 10*3/uL — ABNORMAL LOW (ref 0.7–4.0)
MCH: 21 pg — ABNORMAL LOW (ref 26.0–34.0)
MCHC: 26.2 g/dL — ABNORMAL LOW (ref 30.0–36.0)
MCV: 80.1 fL (ref 80.0–100.0)
Monocytes Absolute: 1.1 10*3/uL — ABNORMAL HIGH (ref 0.1–1.0)
Monocytes Relative: 18 %
Neutro Abs: 4.4 10*3/uL (ref 1.7–7.7)
Neutrophils Relative %: 74 %
Platelets: 256 10*3/uL (ref 150–400)
RBC: 3.62 MIL/uL — AB (ref 4.22–5.81)
RDW: 25.5 % — ABNORMAL HIGH (ref 11.5–15.5)
WBC: 6 10*3/uL (ref 4.0–10.5)
nRBC: 0 % (ref 0.0–0.2)

## 2018-07-12 LAB — ABO/RH: ABO/RH(D): O POS

## 2018-07-12 MED ORDER — FUROSEMIDE 20 MG PO TABS: 20.0000 mg | ORAL_TABLET | Freq: Every day | ORAL | 0 refills | Status: DC | PRN

## 2018-07-12 NOTE — Progress Notes (Signed)
Hematology/Oncology Consult note [facility] [phone] [phone]   Patient Care Team: Deland Pretty, MD as PCP - General (Internal Medicine) Stanford Breed Denice Bors, MD as PCP - Cardiology (Cardiology) Deland Pretty, MD (Internal Medicine)  REFERRING PROVIDER: Dr.Chrystal CHIEF COMPLAINTS/REASON FOR VISIT:  Evaluation of anemia  HISTORY OF PRESENTING ILLNESS:  Evan Dorsey is a  85 y.o.  male with PMH listed below who was referred to me for evaluation of anemia Patient was seen by radiation oncology Dr. Baruch Gouty for management of prostate cancer. 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  He was noted to have rising PSA in 2015 up to 10.9, he underwent transrectal ultrasound-guided biopsy showing a 7 of 12 cores positive for Gleason 7 (4+3)adenocarcinoma. Patient decided on active surveillance.  PSA has been rising most recently in August 2019 which was 17.2.  This prompted another trans-rectal ultrasound-guided biopsy in September 2019 and at this time with 10 out of 12 cores positive for Gleason 8 (4+4). Patient is on Eliquis for anticoagulation for atrial fibrillation had significant bleeding from prostate biopsy.  Bone scan on 03/25/2018 showed negative for osseous metastasis. 03/25/2018 CT abdomen pelvis showed no findings highly suspicious for metastatic disease in the abdomen or pelvis.  Scattered small low-attenuation liver mass are indeterminate in density and appear stable since Oct 19, 2016 CT.  More likely benign.  Mild to moderate prostato megaly Patient has been seen by medical oncology in [place] with recommendation of external beam radiation therapy. Patient has been started on radiation treatment. Was noticed that patient's hemoglobin progressively worsened.  He was referred to hematology for further management. Reviewed patient's recent labs on  06/16/2018 revealed anemia with hemoglobin of 7.4 with MCV of 76. Reviewed patient's previous labs ordered by primary care physician's office, anemia is acute onset, duration is since September 2019 when he had excessive bleeding after prostate biopsy.   Associated signs and symptoms: Patient reports fatigue.  Mild SOB with exertion.  Denies weight loss, easy bruising, hematochezia, hemoptysis, hematuria. Context: History of GI bleeding: Denies Currently on prostate radiation, with urinary symptoms including increased frequency and urgency of urination.  No dysuria.  Constipated. Patient is accompanied by wife.  INTERVAL HISTORY Evan Dorsey is a 85 y.o. male who has above history reviewed by me today presents for evaluation of worsening shortness of breath and lower extremity edema. Patient is on radiation treatment for prostate cancer. He was seen by me on 06/28/2018 for anemia. At that time he was found to have severe iron deficiency.  He feels chronically fatigued. Decision was made to proceed with weekly IV Venofer x4 and being seen in clinic to evaluate clinical response. Patient received 1 Venofer treatment last week. He supposed to receive second dose of Venofer yesterday.  However it seems that he thought Venofer was scheduled next month and did not receive treatment.  Today he was at radiation oncology department and complained worsening of shortness of breath and also worsening of lower extremity edema.  Our office were called and patient was scheduled for an acute visit for symptom management. Denies any chest pain, nausea vomiting, abdominal pain.  Continue to have urinary frequency. Shortness of breath especially with exertion.  Chronic fatigue  #Patient has been on Eliquis 5 mg twice daily for atrial fibrillation.  Reports being compliant with medication.  Review of Systems  Constitutional: Positive for fatigue. Negative for appetite change,  chills, fever and unexpected  weight change.  HENT:   Negative for hearing loss and voice change.   Eyes: Negative for eye problems and icterus.  Respiratory: Positive for shortness of breath. Negative for chest tightness and cough.   Cardiovascular: Negative for chest pain and leg swelling.  Gastrointestinal: Negative for abdominal distention and abdominal pain.  Endocrine: Negative for hot flashes.  Genitourinary: Positive for frequency. Negative for difficulty urinating and dysuria.   Musculoskeletal: Negative for arthralgias.  Skin: Negative for itching and rash.  Neurological: Negative for light-headedness and numbness.  Hematological: Negative for adenopathy. Does not bruise/bleed easily.  Psychiatric/Behavioral: Negative for confusion.   MEDICAL HISTORY:  Past Medical History:  Diagnosis Date  . Adenomatous polyp   . Arthritis    "left shoulder" (03/02/2013)  . Atrial fibrillation ([place])   . Bladder cancer ([place]) 07/31/2011  . CAD (coronary artery disease)    a. remote CABG b. prior cardiac cath 2005 c. Lexiscan Myoview 07/08/12 w/o evidence of ischemia, EF 53%  . Diverticulosis   . Elevated PSA   . Frequent urination    "day and night" (03/02/2013)  . GERD (gastroesophageal reflux disease)   . Gout    "been a long time ago" (03/02/2013)  . History of kidney stones   . HLD (hyperlipidemia)   . HTN (hypertension)   . Iron deficiency anemia due to chronic blood loss 06/29/2018  . Myocardial infarction ([place]) 1990's   silent  . Prostate cancer [facility])     SURGICAL HISTORY: Past Surgical History:  Procedure Laterality Date  . CARDIAC CATHETERIZATION     total occlusion to LAD, LCX, RCA. LIMA patent, normal seq SVG to OM, severe stenosis in SVG to PDA, but collaterals to PLA from OM. RCA went to PDA. treated medically   . CARDIOVASCULAR STRESS TEST  02/10/08   6 mets, poor tolerance, no chest pain   . CATARACT EXTRACTION W/ INTRAOCULAR LENS IMPLANT Right 2014  . CORONARY ARTERY BYPASS GRAFT  08/1991   "CABG  X4" (03/02/2013)  . CYSTOSCOPY W/ STONE MANIPULATION    . ORIF ACETABULAR FRACTURE Left 04/2007   trimalleolar fracture  . TOTAL SHOULDER ARTHROPLASTY Left 03/02/2013  . TOTAL SHOULDER ARTHROPLASTY Left 03/02/2013   Procedure: LEFT TOTAL SHOULDER ARTHROPLASTY;  Surgeon: Marin Shutter, MD;  Location: [facility];  Service: Orthopedics;  Laterality: Left;  . TRANSURETHRAL RESECTION OF BLADDER TUMOR  07/31/2011   Procedure: TRANSURETHRAL RESECTION OF BLADDER TUMOR (TURBT);  Surgeon: Claybon Jabs, MD;  Location: [facility];  Service: Urology;  Laterality: N/A;  with [place]  . URETEROSCOPY WITH HOLMIUM LASER LITHOTRIPSY Right 11/06/2016   Procedure: URETEROSCOPY WITH HOLMIUM LASER LITHOTRIPSY/ RETROGRADE PYELOGRAM/ STENT;  Surgeon: Kathie Rhodes, MD;  Location: [facility];  Service: Urology;  Laterality: Right;    SOCIAL HISTORY: Social History   Socioeconomic History  . Marital status: Married    Spouse name: Not on file  . Number of children: 3  . Years of education: Not on file  . Highest education level: Not on file  Occupational History  . Occupation: retired Psychologist, sport and exercise  Social Needs  . Financial resource strain: Not on file  . Food insecurity:    Worry: Not on file    Inability: Not on file  . Transportation needs:    Medical: Not on file    Non-medical: Not on file  Tobacco Use  . Smoking status: Never Smoker  . Smokeless tobacco: Never Used  Substance and Sexual Activity  .  Alcohol use: No  . Drug use: No  . Sexual activity: Not Currently  Lifestyle  . Physical activity:    Days per week: Not on file    Minutes per session: Not on file  . Stress: Not on file  Relationships  . Social connections:    Talks on phone: Not on file    Gets together: Not on file    Attends religious service: Not on file    Active member of club or organization: Not on file    Attends meetings of clubs or organizations: Not on file    Relationship status: Not on file  . Intimate partner  violence:    Fear of current or ex partner: Not on file    Emotionally abused: Not on file    Physically abused: Not on file    Forced sexual activity: Not on file  Other Topics Concern  . Not on file  Social History Narrative   Lives at home with wife. Retired Psychologist, sport and exercise.     FAMILY HISTORY: Family History  Problem Relation Age of Onset  . CAD Father 70  . Prostate cancer Brother   . CAD Brother 8  . CAD Brother 63  . Breast cancer Neg Hx   . Colon cancer Neg Hx   . Pancreatic cancer Neg Hx     ALLERGIES:  has No Known Allergies.  MEDICATIONS:  Current Outpatient Medications  Medication Sig Dispense Refill  . acetaminophen (TYLENOL) 500 MG tablet Take 500 mg by mouth every 6 (six) hours as needed.    Marland Kitchen amiodarone (PACERONE) 200 MG tablet TAKE ONE TABLET BY MOUTH DAILY 30 tablet 6  . amLODipine (NORVASC) 5 MG tablet TAKE ONE TABLET BY MOUTH EVERY DAY 90 tablet 3  . calcium carbonate (TUMS - DOSED IN MG ELEMENTAL CALCIUM) 500 MG chewable tablet Chew 2 tablets by mouth daily.    . cholecalciferol (VITAMIN D) 1000 units tablet Take 1,000 Units by mouth daily.    Marland Kitchen ELIQUIS 5 MG TABS tablet TAKE ONE TABLET BY MOUTH TWICE DAILY 180 tablet 1  . MYRBETRIQ 50 MG TB24 tablet Take 50 mg by mouth daily.  11  . nitroGLYCERIN (NITROSTAT) 0.4 MG SL tablet PLACE 1 TABLET UNDER THE TONGUE EVERY 5 MINUTES IF NEEDED FOR CHEST PAIN. MAX = 3 TABS/EPISODE. 25 tablet 4  . psyllium (HYDROCIL/METAMUCIL) 95 % PACK Take 1 packet by mouth daily. 240 each 0  . rosuvastatin (CRESTOR) 40 MG tablet Take 40 mg by mouth daily.  3  . tamsulosin (FLOMAX) 0.4 MG CAPS capsule Take 1 capsule (0.4 mg total) by mouth daily after supper. 30 capsule 6  . furosemide (LASIX) 20 MG tablet Take 1 tablet (20 mg total) by mouth daily as needed. 30 tablet 0   No current facility-administered medications for this visit.      PHYSICAL EXAMINATION: ECOG PERFORMANCE STATUS: 2 - Symptomatic, <50% confined to bed Vitals:    07/12/18 1541  BP: (!) 146/74  Pulse: (!) 54  Resp: 20  Temp: 97.6 F (36.4 C)  SpO2: 97%   Filed Weights   07/12/18 1541  Weight: 243 lb 14.4 oz (110.6 kg)    Physical Exam Constitutional:      General: He is not in acute distress. HENT:     Head: Normocephalic and atraumatic.  Eyes:     General: No scleral icterus.    Pupils: Pupils are equal, round, and reactive to light.  Neck:     Musculoskeletal:  Normal range of motion and neck supple.  Cardiovascular:     Rate and Rhythm: Normal rate and regular rhythm.     Heart sounds: Normal heart sounds.  Pulmonary:     Effort: Pulmonary effort is normal. No respiratory distress.     Breath sounds: No wheezing.  Abdominal:     General: Bowel sounds are normal. There is no distension.     Palpations: Abdomen is soft. There is no mass.     Tenderness: There is no abdominal tenderness.  Musculoskeletal: Normal range of motion.        General: Swelling present. No deformity.     Comments: Bilateral lower extremity 3+ edema  Skin:    General: Skin is warm and dry.     Coloration: Skin is pale.     Findings: No erythema or rash.  Neurological:     Mental Status: He is alert and oriented to person, place, and time.     Cranial Nerves: No cranial nerve deficit.     Coordination: Coordination normal.  Psychiatric:        Behavior: Behavior normal.        Thought Content: Thought content normal.      LABORATORY DATA:  I have reviewed the data as listed Lab Results  Component Value Date   WBC 6.0 07/12/2018   HGB 7.6 (L) 07/12/2018   HCT 29.0 (L) 07/12/2018   MCV 80.1 07/12/2018   PLT 256 07/12/2018   Recent Labs    03/08/18 0403  05/09/18 1032 06/28/18 1445 07/12/18 1456  NA 144   < > 140 143 143  K 3.4*   < > 4.2 3.6 3.5  CL 114*   < > 106 109 109  CO2 21*   < > 19* 26 26  GLUCOSE 113*   < > 89 114* 105*  BUN 17   < > 12 19 14   CREATININE 1.12   < > 1.31* 1.23 1.19  CALCIUM 8.4*   < > 9.2 9.4 9.3  GFRNONAA  59*   < > 50* 54* 56*  GFRAA >60   < > 58* >60 >60  PROT 5.3*  --   --  6.5 6.7  ALBUMIN 2.8*  --   --  3.6 3.6  AST 34  --   --  28 29  ALT 35  --   --  23 23  ALKPHOS 34*  --   --  53 54  BILITOT 0.5  --   --  0.6 0.7   < > = values in this interval not displayed.   Iron/TIBC/Ferritin/ %Sat    Component Value Date/Time   IRON 19 (L) 06/28/2018 1445   TIBC 439 06/28/2018 1445   FERRITIN 6 (L) 06/28/2018 1445   IRONPCTSAT 4 (L) 06/28/2018 1445     RADIOGRAPHIC STUDIES: I have personally reviewed the radiological images as listed and agreed with the findings in the report. 05/17/2018 CT angio chest IMPRESSION: 1. No overt aneurysmal disease of the ascending thoracic aorta by CTA measurement. The ascending aorta is top normal in caliber and measures 3.9 cm in greatest diameter. 2. Normal variant separate origin of the left vertebral artery off of the aortic arch. Bone scan on 03/25/2018 showed negative for osseous metastasis. 03/25/2018 CT abdomen pelvis showed no findings highly suspicious for metastatic disease in the abdomen or pelvis.  Scattered small low-attenuation liver mass are indeterminate in density and appear stable since Oct 19, 2016 CT.  More  likely benign.  Mild to moderate prostato megaly   ASSESSMENT & PLAN:  1. Iron deficiency anemia due to chronic blood loss   2. Microcytic anemia   3. Prostate cancer ([place])   4. Symptomatic anemia   5. Bilateral lower extremity edema    #Labs are reviewed and discussed with patient. He received 1 dose of IV Venofer 200 mg. Hemoglobin slightly improved from 7.4 to7.6.  MCV improved and normalized. Patient is quite symptomatic due to low hemoglobin. Plan to proceed with 1 unit of PRBC transfusion on 07/13/2018.  Will also receive Lasix 20 mg IV along with PRBC blood transfusion.  #Lower extremity edema, bilaterally, less likely due to thrombosis as he has been on chronic anticoagulation for atrial fibrillation. 2D echo  11/01/2017 showed LVEF 55% to 60%.  Akinesis of the basal inferior myocardium, abnormal left ventricular relaxation.  Grade 1 diastolic dysfunction.  Aortic stenosis. Leg edema, multifactorial, can be side effects from IV Venofer or this is due to chronic diastolic CHF. Chest x-ray was independently reviewed by me, cardiomegaly with vascular congestion. Recommend trial of Lasix 20 mg daily as needed for lower extremity edema.  Prescription sent to pharmacy.   # Prostate cancer, he has been seen by Dr.Manning who recommend ADT in combination with 8 weeks of external beam radiotherapy. Patient follows up with Dr.Ottelin for ADT treatment .  Currently following up with Dr. Baruch Gouty for radiation treatments.   All questions were answered. The patient knows to call the clinic with any problems questions or concerns.  Return of visit: 1 week for reevaluation. We spent sufficient time to discuss many aspect of care, questions were answered to patient's satisfaction. Total face to face encounter time for this patient visit was 25 min. >50% of the time was  spent in counseling and coordination of care.   Earlie Server, MD, PhD Hematology Oncology [facility] Pager- [phone] .06/28/2017

## 2018-07-12 NOTE — Progress Notes (Signed)
Patient here for follow up. Pt complains of edema to feet and legs. Per wife, she noted that wheezing and shortness of breath has increased.

## 2018-07-13 ENCOUNTER — Other Ambulatory Visit: Payer: Self-pay | Admitting: *Deleted

## 2018-07-13 ENCOUNTER — Other Ambulatory Visit: Payer: Self-pay | Admitting: Oncology

## 2018-07-13 ENCOUNTER — Ambulatory Visit
Admission: RE | Admit: 2018-07-13 | Discharge: 2018-07-13 | Disposition: A | Discharge status: Home / Self Care | Payer: Medicare Other | Source: Ambulatory Visit | Attending: Radiation Oncology | Admitting: Radiation Oncology

## 2018-07-13 ENCOUNTER — Inpatient Hospital Stay: Payer: Medicare Other

## 2018-07-13 DIAGNOSIS — D649 Anemia, unspecified: Secondary | ICD-10-CM

## 2018-07-13 DIAGNOSIS — Z7901 Long term (current) use of anticoagulants: Secondary | ICD-10-CM | POA: Diagnosis not present

## 2018-07-13 DIAGNOSIS — C61 Malignant neoplasm of prostate: Secondary | ICD-10-CM | POA: Diagnosis not present

## 2018-07-13 DIAGNOSIS — I4891 Unspecified atrial fibrillation: Secondary | ICD-10-CM | POA: Diagnosis not present

## 2018-07-13 DIAGNOSIS — D500 Iron deficiency anemia secondary to blood loss (chronic): Secondary | ICD-10-CM | POA: Diagnosis not present

## 2018-07-13 DIAGNOSIS — I10 Essential (primary) hypertension: Secondary | ICD-10-CM | POA: Diagnosis not present

## 2018-07-13 LAB — PREPARE RBC (CROSSMATCH)

## 2018-07-13 MED ORDER — SODIUM CHLORIDE 0.9% IV SOLUTION
250.0000 mL | Freq: Once | INTRAVENOUS | Status: AC
  Administered 2018-07-13: 250 mL via INTRAVENOUS
  Filled 2018-07-13: qty 250

## 2018-07-13 MED ORDER — DIPHENHYDRAMINE HCL 25 MG PO CAPS
25.0000 mg | ORAL_CAPSULE | Freq: Once | ORAL | Status: AC
  Administered 2018-07-13: 25 mg via ORAL
  Filled 2018-07-13: qty 1

## 2018-07-13 MED ORDER — ACETAMINOPHEN 325 MG PO TABS
650.0000 mg | ORAL_TABLET | Freq: Once | ORAL | Status: AC
  Administered 2018-07-13: 650 mg via ORAL
  Filled 2018-07-13: qty 2

## 2018-07-13 MED ORDER — FUROSEMIDE 10 MG/ML IJ SOLN
20.0000 mg | Freq: Once | INTRAMUSCULAR | Status: AC
  Administered 2018-07-13: 20 mg via INTRAVENOUS
  Filled 2018-07-13: qty 2

## 2018-07-13 NOTE — Addendum Note (Signed)
Addended by: Earlie Server on: 07/13/2018 01:12 PM   Modules accepted: Orders

## 2018-07-14 ENCOUNTER — Ambulatory Visit
Admission: RE | Admit: 2018-07-14 | Discharge: 2018-07-14 | Disposition: A | Discharge status: Home / Self Care | Payer: Medicare Other | Source: Ambulatory Visit | Attending: Radiation Oncology | Admitting: Radiation Oncology

## 2018-07-14 DIAGNOSIS — C61 Malignant neoplasm of prostate: Secondary | ICD-10-CM | POA: Diagnosis not present

## 2018-07-14 LAB — TYPE AND SCREEN
ABO/RH(D): O POS
Antibody Screen: NEGATIVE
Unit division: 0

## 2018-07-14 LAB — BPAM RBC
Blood Product Expiration Date: 202002272359
ISSUE DATE / TIME: 202001291128
Unit Type and Rh: 5100

## 2018-07-18 ENCOUNTER — Inpatient Hospital Stay: Payer: Medicare Other | Attending: Oncology

## 2018-07-18 ENCOUNTER — Other Ambulatory Visit: Payer: Self-pay

## 2018-07-18 ENCOUNTER — Inpatient Hospital Stay (HOSPITAL_BASED_OUTPATIENT_CLINIC_OR_DEPARTMENT_OTHER): Payer: Medicare Other | Admitting: Oncology

## 2018-07-18 ENCOUNTER — Encounter: Payer: Self-pay | Admitting: Oncology

## 2018-07-18 ENCOUNTER — Inpatient Hospital Stay: Payer: Medicare Other

## 2018-07-18 VITALS — BP 118/70 | HR 60 | Temp 98.1°F | Resp 18

## 2018-07-18 VITALS — BP 120/65 | HR 59 | Temp 97.1°F | Resp 18 | Wt 239.5 lb

## 2018-07-18 DIAGNOSIS — I4891 Unspecified atrial fibrillation: Secondary | ICD-10-CM

## 2018-07-18 DIAGNOSIS — D500 Iron deficiency anemia secondary to blood loss (chronic): Secondary | ICD-10-CM

## 2018-07-18 DIAGNOSIS — D509 Iron deficiency anemia, unspecified: Secondary | ICD-10-CM | POA: Insufficient documentation

## 2018-07-18 DIAGNOSIS — R600 Localized edema: Secondary | ICD-10-CM | POA: Diagnosis not present

## 2018-07-18 DIAGNOSIS — I252 Old myocardial infarction: Secondary | ICD-10-CM | POA: Insufficient documentation

## 2018-07-18 DIAGNOSIS — D649 Anemia, unspecified: Secondary | ICD-10-CM

## 2018-07-18 DIAGNOSIS — I10 Essential (primary) hypertension: Secondary | ICD-10-CM

## 2018-07-18 DIAGNOSIS — Z923 Personal history of irradiation: Secondary | ICD-10-CM | POA: Diagnosis not present

## 2018-07-18 DIAGNOSIS — Z7901 Long term (current) use of anticoagulants: Secondary | ICD-10-CM

## 2018-07-18 DIAGNOSIS — I517 Cardiomegaly: Secondary | ICD-10-CM

## 2018-07-18 DIAGNOSIS — Z79899 Other long term (current) drug therapy: Secondary | ICD-10-CM

## 2018-07-18 DIAGNOSIS — C61 Malignant neoplasm of prostate: Secondary | ICD-10-CM | POA: Diagnosis not present

## 2018-07-18 LAB — SAMPLE TO BLOOD BANK

## 2018-07-18 LAB — CBC WITH DIFFERENTIAL/PLATELET
Abs Immature Granulocytes: 0.02 10*3/uL (ref 0.00–0.07)
Basophils Absolute: 0 10*3/uL (ref 0.0–0.1)
Basophils Relative: 0 %
Eosinophils Absolute: 0.1 10*3/uL (ref 0.0–0.5)
Eosinophils Relative: 1 %
HCT: 34.8 % — ABNORMAL LOW (ref 39.0–52.0)
Hemoglobin: 9.2 g/dL — ABNORMAL LOW (ref 13.0–17.0)
Immature Granulocytes: 0 %
Lymphocytes Relative: 6 %
Lymphs Abs: 0.4 10*3/uL — ABNORMAL LOW (ref 0.7–4.0)
MCH: 21.5 pg — AB (ref 26.0–34.0)
MCHC: 26.4 g/dL — ABNORMAL LOW (ref 30.0–36.0)
MCV: 81.5 fL (ref 80.0–100.0)
MONO ABS: 1.2 10*3/uL — AB (ref 0.1–1.0)
Monocytes Relative: 17 %
Neutro Abs: 5.2 10*3/uL (ref 1.7–7.7)
Neutrophils Relative %: 76 %
Platelets: 262 10*3/uL (ref 150–400)
RBC: 4.27 MIL/uL (ref 4.22–5.81)
RDW: 24.8 % — ABNORMAL HIGH (ref 11.5–15.5)
WBC: 6.9 10*3/uL (ref 4.0–10.5)
nRBC: 0 % (ref 0.0–0.2)

## 2018-07-18 MED ORDER — SODIUM CHLORIDE 0.9 % IV SOLN
Freq: Once | INTRAVENOUS | Status: AC
  Administered 2018-07-18: 14:00:00 via INTRAVENOUS
  Filled 2018-07-18: qty 250

## 2018-07-18 MED ORDER — IRON SUCROSE 20 MG/ML IV SOLN
200.0000 mg | Freq: Once | INTRAVENOUS | Status: AC
  Administered 2018-07-18: 200 mg via INTRAVENOUS
  Filled 2018-07-18: qty 10

## 2018-07-18 NOTE — Progress Notes (Signed)
Hematology/Oncology Consult note [facility] Telephone:([phone] [phone]   Patient Care Team: Deland Pretty, MD as PCP - General (Internal Medicine) Stanford Breed Denice Bors, MD as PCP - Cardiology (Cardiology) Deland Pretty, MD (Internal Medicine)  REFERRING PROVIDER: Dr.Chrystal CHIEF COMPLAINTS/REASON FOR VISIT:  Evaluation of anemia  HISTORY OF PRESENTING ILLNESS:  Evan Dorsey is a  85 y.o.  male with PMH listed below who was referred to me for evaluation of anemia Patient was seen by radiation oncology Dr. Baruch Gouty for management of prostate cancer. 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  He was noted to have rising PSA in 2015 up to 10.9, he underwent transrectal ultrasound-guided biopsy showing a 7 of 12 cores positive for Gleason 7 (4+3)adenocarcinoma. Patient decided on active surveillance.  PSA has been rising most recently in August 2019 which was 17.2.  This prompted another trans-rectal ultrasound-guided biopsy in September 2019 and at this time with 10 out of 12 cores positive for Gleason 8 (4+4). Patient is on Eliquis for anticoagulation for atrial fibrillation had significant bleeding from prostate biopsy.  Bone scan on 03/25/2018 showed negative for osseous metastasis. 03/25/2018 CT abdomen pelvis showed no findings highly suspicious for metastatic disease in the abdomen or pelvis.  Scattered small low-attenuation liver mass are indeterminate in density and appear stable since Oct 19, 2016 CT.  More likely benign.  Mild to moderate prostato megaly Patient has been seen by medical oncology in [place] with recommendation of external beam radiation therapy. Patient has been started on radiation treatment. Was noticed that patient's hemoglobin progressively worsened.  He was referred to hematology for further management. Reviewed patient's recent labs on  06/16/2018 revealed anemia with hemoglobin of 7.4 with MCV of 76. Reviewed patient's previous labs ordered by primary care physician's office, anemia is acute onset, duration is since September 2019 when he had excessive bleeding after prostate biopsy.   Associated signs and symptoms: Patient reports fatigue.  Mild SOB with exertion.  Denies weight loss, easy bruising, hematochezia, hemoptysis, hematuria. Context: History of GI bleeding: Denies Currently on prostate radiation, with urinary symptoms including increased frequency and urgency of urination.  No dysuria.  Constipated. Patient is accompanied by wife.  INTERVAL HISTORY Evan Dorsey is a 85 y.o. male who has above history reviewed by me today presents for evaluation of worsening shortness of breath and lower extremity edema. Status post 1 unit of PRBC transfusion last week.  Reports feeling shortness of breath and energy level slightly better. He has finished his radiation treatment a few days ago. Reports mild rectal incontinence.  On Eliquis for chronic anticoagulation for atrial fibrillation. Bilateral lower extremity edema, started on Lasix 20 mg daily reports edema is worst at the end of the day.  Lasix has not improved his edema.    Review of Systems  Constitutional: Positive for fatigue. Negative for appetite change, chills, fever and unexpected weight change.  HENT:   Negative for hearing loss and voice change.   Eyes: Negative for eye problems and icterus.  Respiratory: Negative for chest tightness, cough and shortness of breath.   Cardiovascular: Negative for chest pain and leg swelling.  Gastrointestinal: Negative for abdominal distention and abdominal pain.  Endocrine: Negative for hot flashes.  Genitourinary: Positive for frequency. Negative for difficulty urinating and dysuria.   Musculoskeletal: Negative for arthralgias.  Skin: Negative for itching and rash.  Neurological: Negative for light-headedness and  numbness.  Hematological: Negative for adenopathy. Does not bruise/bleed easily.  Psychiatric/Behavioral: Negative for confusion.   MEDICAL HISTORY:  Past Medical History:  Diagnosis Date  . Adenomatous polyp   . Arthritis    "left shoulder" (03/02/2013)  . Atrial fibrillation ([place])   . Bladder cancer ([place]) 07/31/2011  . CAD (coronary artery disease)    a. remote CABG b. prior cardiac cath 2005 c. Lexiscan Myoview 07/08/12 w/o evidence of ischemia, EF 53%  . Diverticulosis   . Elevated PSA   . Frequent urination    "day and night" (03/02/2013)  . GERD (gastroesophageal reflux disease)   . Gout    "been a long time ago" (03/02/2013)  . History of kidney stones   . HLD (hyperlipidemia)   . HTN (hypertension)   . Iron deficiency anemia due to chronic blood loss 06/29/2018  . Myocardial infarction ([place]) 1990's   silent  . Prostate cancer [facility])     SURGICAL HISTORY: Past Surgical History:  Procedure Laterality Date  . CARDIAC CATHETERIZATION     total occlusion to LAD, LCX, RCA. LIMA patent, normal seq SVG to OM, severe stenosis in SVG to PDA, but collaterals to PLA from OM. RCA went to PDA. treated medically   . CARDIOVASCULAR STRESS TEST  02/10/08   6 mets, poor tolerance, no chest pain   . CATARACT EXTRACTION W/ INTRAOCULAR LENS IMPLANT Right 2014  . CORONARY ARTERY BYPASS GRAFT  08/1991   "CABG X4" (03/02/2013)  . CYSTOSCOPY W/ STONE MANIPULATION    . ORIF ACETABULAR FRACTURE Left 04/2007   trimalleolar fracture  . TOTAL SHOULDER ARTHROPLASTY Left 03/02/2013  . TOTAL SHOULDER ARTHROPLASTY Left 03/02/2013   Procedure: LEFT TOTAL SHOULDER ARTHROPLASTY;  Surgeon: Marin Shutter, MD;  Location: [facility];  Service: Orthopedics;  Laterality: Left;  . TRANSURETHRAL RESECTION OF BLADDER TUMOR  07/31/2011   Procedure: TRANSURETHRAL RESECTION OF BLADDER TUMOR (TURBT);  Surgeon: Claybon Jabs, MD;  Location: [facility];  Service: Urology;  Laterality: N/A;  with [place]  . URETEROSCOPY WITH HOLMIUM  LASER LITHOTRIPSY Right 11/06/2016   Procedure: URETEROSCOPY WITH HOLMIUM LASER LITHOTRIPSY/ RETROGRADE PYELOGRAM/ STENT;  Surgeon: Kathie Rhodes, MD;  [facility];  Service: Urology;  Laterality: Right;    SOCIAL HISTORY: Social History   Socioeconomic History  . Marital status: Married    Spouse name: Not on file  . Number of children: 3  . Years of education: Not on file  . Highest education level: Not on file  Occupational History  . Occupation: retired Psychologist, sport and exercise  Social Needs  . Financial resource strain: Not on file  . Food insecurity:    Worry: Not on file    Inability: Not on file  . Transportation needs:    Medical: Not on file    Non-medical: Not on file  Tobacco Use  . Smoking status: Never Smoker  . Smokeless tobacco: Never Used  Substance and Sexual Activity  . Alcohol use: No  . Drug use: No  . Sexual activity: Not Currently  Lifestyle  . Physical activity:    Days per week: Not on file    Minutes per session: Not on file  . Stress: Not on file  Relationships  . Social connections:    Talks on phone: Not on file    Gets together: Not on file    Attends religious service: Not on file    Active member of club or organization: Not on file    Attends meetings  of clubs or organizations: Not on file    Relationship status: Not on file  . Intimate partner violence:    Fear of current or ex partner: Not on file    Emotionally abused: Not on file    Physically abused: Not on file    Forced sexual activity: Not on file  Other Topics Concern  . Not on file  Social History Narrative   Lives at home with wife. Retired Psychologist, sport and exercise.     FAMILY HISTORY: Family History  Problem Relation Age of Onset  . CAD Father 77  . Prostate cancer Brother   . CAD Brother 29  . CAD Brother 32  . Breast cancer Neg Hx   . Colon cancer Neg Hx   . Pancreatic cancer Neg Hx     ALLERGIES:  has No Known Allergies.  MEDICATIONS:  Current Outpatient  Medications  Medication Sig Dispense Refill  . acetaminophen (TYLENOL) 500 MG tablet Take 500 mg by mouth every 6 (six) hours as needed.    Marland Kitchen amiodarone (PACERONE) 200 MG tablet TAKE ONE TABLET BY MOUTH DAILY 30 tablet 6  . amLODipine (NORVASC) 5 MG tablet TAKE ONE TABLET BY MOUTH EVERY DAY 90 tablet 3  . calcium carbonate (TUMS - DOSED IN MG ELEMENTAL CALCIUM) 500 MG chewable tablet Chew 2 tablets by mouth daily.    . cholecalciferol (VITAMIN D) 1000 units tablet Take 1,000 Units by mouth daily.    Marland Kitchen ELIQUIS 5 MG TABS tablet TAKE ONE TABLET BY MOUTH TWICE DAILY 180 tablet 1  . furosemide (LASIX) 20 MG tablet Take 1 tablet (20 mg total) by mouth daily as needed. 30 tablet 0  . MYRBETRIQ 50 MG TB24 tablet Take 50 mg by mouth daily.  11  . nitroGLYCERIN (NITROSTAT) 0.4 MG SL tablet PLACE 1 TABLET UNDER THE TONGUE EVERY 5 MINUTES IF NEEDED FOR CHEST PAIN. MAX = 3 TABS/EPISODE. 25 tablet 4  . psyllium (HYDROCIL/METAMUCIL) 95 % PACK Take 1 packet by mouth daily. 240 each 0  . rosuvastatin (CRESTOR) 40 MG tablet Take 40 mg by mouth daily.  3  . tamsulosin (FLOMAX) 0.4 MG CAPS capsule Take 1 capsule (0.4 mg total) by mouth daily after supper. 30 capsule 6   No current facility-administered medications for this visit.      PHYSICAL EXAMINATION: ECOG PERFORMANCE STATUS: 2 - Symptomatic, <50% confined to bed Vitals:   07/18/18 1316  BP: 120/65  Pulse: (!) 59  Resp: 18  Temp: (!) 97.1 F (36.2 C)   Filed Weights   07/18/18 1316  Weight: 239 lb 8 oz (108.6 kg)    Physical Exam Constitutional:      General: He is not in acute distress. HENT:     Head: Normocephalic and atraumatic.  Eyes:     General: No scleral icterus.    Pupils: Pupils are equal, round, and reactive to light.  Neck:     Musculoskeletal: Normal range of motion and neck supple.  Cardiovascular:     Rate and Rhythm: Normal rate and regular rhythm.     Heart sounds: Normal heart sounds.  Pulmonary:     Effort:  Pulmonary effort is normal. No respiratory distress.     Breath sounds: No wheezing.  Abdominal:     General: Bowel sounds are normal. There is no distension.     Palpations: Abdomen is soft. There is no mass.     Tenderness: There is no abdominal tenderness.  Musculoskeletal: Normal range of motion.  General: Swelling present. No deformity.     Comments: Bilateral lower extremity 3+ edema  Skin:    General: Skin is warm and dry.     Coloration: Skin is pale.     Findings: No erythema or rash.  Neurological:     Mental Status: He is alert and oriented to person, place, and time.     Cranial Nerves: No cranial nerve deficit.     Coordination: Coordination normal.  Psychiatric:        Behavior: Behavior normal.        Thought Content: Thought content normal.      LABORATORY DATA:  I have reviewed the data as listed Lab Results  Component Value Date   WBC 6.9 07/18/2018   HGB 9.2 (L) 07/18/2018   HCT 34.8 (L) 07/18/2018   MCV 81.5 07/18/2018   PLT 262 07/18/2018   Recent Labs    03/08/18 0403  05/09/18 1032 06/28/18 1445 07/12/18 1456  NA 144   < > 140 143 143  K 3.4*   < > 4.2 3.6 3.5  CL 114*   < > 106 109 109  CO2 21*   < > 19* 26 26  GLUCOSE 113*   < > 89 114* 105*  BUN 17   < > 12 19 14   CREATININE 1.12   < > 1.31* 1.23 1.19  CALCIUM 8.4*   < > 9.2 9.4 9.3  GFRNONAA 59*   < > 50* 54* 56*  GFRAA >60   < > 58* >60 >60  PROT 5.3*  --   --  6.5 6.7  ALBUMIN 2.8*  --   --  3.6 3.6  AST 34  --   --  28 29  ALT 35  --   --  23 23  ALKPHOS 34*  --   --  53 54  BILITOT 0.5  --   --  0.6 0.7   < > = values in this interval not displayed.   Iron/TIBC/Ferritin/ %Sat    Component Value Date/Time   IRON 19 (L) 06/28/2018 1445   TIBC 439 06/28/2018 1445   FERRITIN 6 (L) 06/28/2018 1445   IRONPCTSAT 4 (L) 06/28/2018 1445     RADIOGRAPHIC STUDIES: I have personally reviewed the radiological images as listed and agreed with the findings in the  report. 05/17/2018 CT angio chest IMPRESSION: 1. No overt aneurysmal disease of the ascending thoracic aorta by CTA measurement. The ascending aorta is top normal in caliber and measures 3.9 cm in greatest diameter. 2. Normal variant separate origin of the left vertebral artery off of the aortic arch. Bone scan on 03/25/2018 showed negative for osseous metastasis. 03/25/2018 CT abdomen pelvis showed no findings highly suspicious for metastatic disease in the abdomen or pelvis.  Scattered small low-attenuation liver mass are indeterminate in density and appear stable since Oct 19, 2016 CT.  More likely benign.  Mild to moderate prostato megaly   ASSESSMENT & PLAN:  1. Iron deficiency anemia due to chronic blood loss   2. Prostate cancer ([place])   3. Bilateral lower extremity edema    #Labs are reviewed and discussed with patient. Status post 1 dose of IV Venofer 200 mg, and 1 unit of PRBC transfusion. Labs reviewed and discussed with patient. Hemoglobin has improved to 9.2.  MCV improved to 81.5. Proceed with additional 3 doses of IV Venofer Repeat CBC, iron, TIBC, ferritin for evaluation for additional benefit in 3 weeks.  #Lower extremity edema,  bilaterally, less likely due to thrombosis as he has been on chronic anticoagulation for atrial fibrillation. 2D echo 11/01/2017 showed LVEF 55% to 60%.  Akinesis of the basal inferior myocardium, abnormal left ventricular relaxation.  Grade 1 diastolic dysfunction.  Aortic stenosis. Chest x-ray was independently reviewed by me, cardiomegaly with vascular congestion. Continue Lasix 20 mg daily.  Advised patient to follow-up with his cardiologist.  Per patient's wife they have a follow-up appointment coming up with cardiology.  #Prostate cancer status post radiation. All questions were answered. The patient knows to call the clinic with any problems questions or concerns.  Return of visit: 3 weeksfor reevaluation. Earlie Server, MD, PhD Hematology  Oncology [facility] Pager- [phone] .06/28/2017

## 2018-07-21 ENCOUNTER — Other Ambulatory Visit: Payer: Self-pay | Admitting: *Deleted

## 2018-07-21 ENCOUNTER — Ambulatory Visit (INDEPENDENT_AMBULATORY_CARE_PROVIDER_SITE_OTHER): Payer: Medicare Other | Admitting: Physician Assistant

## 2018-07-21 ENCOUNTER — Encounter: Payer: Self-pay | Admitting: Physician Assistant

## 2018-07-21 ENCOUNTER — Ambulatory Visit: Payer: Medicare Other | Admitting: Cardiology

## 2018-07-21 VITALS — BP 136/58 | HR 56 | Ht 71.0 in | Wt 237.0 lb

## 2018-07-21 DIAGNOSIS — R600 Localized edema: Secondary | ICD-10-CM | POA: Diagnosis not present

## 2018-07-21 DIAGNOSIS — I25810 Atherosclerosis of coronary artery bypass graft(s) without angina pectoris: Secondary | ICD-10-CM | POA: Diagnosis not present

## 2018-07-21 DIAGNOSIS — E785 Hyperlipidemia, unspecified: Secondary | ICD-10-CM | POA: Diagnosis not present

## 2018-07-21 DIAGNOSIS — C61 Malignant neoplasm of prostate: Secondary | ICD-10-CM | POA: Diagnosis not present

## 2018-07-21 DIAGNOSIS — I480 Paroxysmal atrial fibrillation: Secondary | ICD-10-CM | POA: Diagnosis not present

## 2018-07-21 DIAGNOSIS — I5033 Acute on chronic diastolic (congestive) heart failure: Secondary | ICD-10-CM

## 2018-07-21 DIAGNOSIS — I10 Essential (primary) hypertension: Secondary | ICD-10-CM | POA: Diagnosis not present

## 2018-07-21 MED ORDER — FUROSEMIDE 40 MG PO TABS: 40.0000 mg | ORAL_TABLET | Freq: Every day | ORAL | 3 refills | Status: DC

## 2018-07-21 MED ORDER — APIXABAN 5 MG PO TABS: 5.0000 mg | ORAL_TABLET | Freq: Two times a day (BID) | ORAL | 0 refills | Status: DC

## 2018-07-21 MED ORDER — POTASSIUM CHLORIDE CRYS ER 20 MEQ PO TBCR: 20.0000 meq | EXTENDED_RELEASE_TABLET | Freq: Every day | ORAL | 3 refills | Status: DC

## 2018-07-21 NOTE — Progress Notes (Addendum)
Cardiology Office Note    Date:  07/23/2018   ID:  Jc, Veron 29-Jun-1932, MRN [id number]  PCP:  Deland Pretty, MD  Cardiologist: Dr. Stanford Breed  Chief Complaint  Patient presents with  . Follow-up    6 months.  . Edema    History of Present Illness:  Evan Dorsey is a 85 y.o. male with PMH of CAD s/p CABG, PAF, HTN, HLD and history of prostate and bladder cancer.  He underwent bypass surgery 1993.  Previous cardiac catheterization in 2005 showed occluded LAD, left circumflex and RCA.  Patent LIMA to LAD, patent SVG to OM, severe stenosis in the SVG to PDA but there were collaterals from OM and PLA.  Medical therapy was recommended.  He was diagnosed with bladder cancer in 2013 and had resection.  Myoview in July 2017 showed EF 59%, no ischemia.  Echocardiogram in May 2019 showed normal LV function, akinesis of the basal inferior wall, mild diastolic dysfunction, mild aortic stenosis with mean gradient 40 mmHg, mildly dilated ascending thoracic aorta measuring 44 mm.  Patient was last seen by Dr. Stanford Breed in November 2019, he was doing well at the time.  CT angiogram of the chest was obtained on 05/17/2018 which showed a sending aorta measuring 3.9 cm in greatest diameter, otherwise no overt aneurysmal disease noted in the ascending aorta by CTA measurement.  Lab work recently showed hemoglobin of 7.1 in January 2020, MCV 76, concerning for iron deficiency anemia due to blood loss.  He is being treated with iron infusion.  Hemoglobin improved to 9.2 after the iron infusion and 1 unit PRBC.   Patient presents today for cardiology office visit.  He denies any recent chest pain but does have some shortness of breath which is improving after iron infusion and blood transfusion.  For the past several weeks, he also noticed significant lower extremity edema.  He has at least 3+ pitting edema in bilateral lower extremity.  His lungs is clear.  His abdomen is soft.  For the past several  weeks, he was started on 20 mg daily of Lasix instead of as needed dose.  I will increase the Lasix to 40 mg daily.  I will also add potassium supplement 20 mEq daily.  Previous lab work showed normal albumin level.  He will need basic metabolic panel today and also in 1 week.  I also recommended echocardiogram to evaluate for any drop in the ejection fraction.  He says prior to the bypass surgery, he never had chest pain, his symptom has always been dyspnea.  He says his dyspnea is improving after iron transfusion the blood transfusion, I suspect his recent symptom is more related to the anemia.   Past Medical History:  Diagnosis Date  . Adenomatous polyp   . Arthritis    "left shoulder" (03/02/2013)  . Atrial fibrillation ([place])   . Bladder cancer ([place]) 07/31/2011  . CAD (coronary artery disease)    a. remote CABG b. prior cardiac cath 2005 c. Lexiscan Myoview 07/08/12 w/o evidence of ischemia, EF 53%  . Diverticulosis   . Elevated PSA   . Frequent urination    "day and night" (03/02/2013)  . GERD (gastroesophageal reflux disease)   . Gout    "been a long time ago" (03/02/2013)  . History of kidney stones   . HLD (hyperlipidemia)   . HTN (hypertension)   . Iron deficiency anemia due to chronic blood loss 06/29/2018  . Myocardial infarction [facility]) 1990's  silent  . Prostate cancer [facility])     Past Surgical History:  Procedure Laterality Date  . CARDIAC CATHETERIZATION     total occlusion to LAD, LCX, RCA. LIMA patent, normal seq SVG to OM, severe stenosis in SVG to PDA, but collaterals to PLA from OM. RCA went to PDA. treated medically   . CARDIOVASCULAR STRESS TEST  02/10/08   6 mets, poor tolerance, no chest pain   . CATARACT EXTRACTION W/ INTRAOCULAR LENS IMPLANT Right 2014  . CORONARY ARTERY BYPASS GRAFT  08/1991   "CABG X4" (03/02/2013)  . CYSTOSCOPY W/ STONE MANIPULATION    . ORIF ACETABULAR FRACTURE Left 04/2007   trimalleolar fracture  . TOTAL SHOULDER ARTHROPLASTY Left 03/02/2013   . TOTAL SHOULDER ARTHROPLASTY Left 03/02/2013   Procedure: LEFT TOTAL SHOULDER ARTHROPLASTY;  Surgeon: Marin Shutter, MD;  Location: [place];  Service: Orthopedics;  Laterality: Left;  . TRANSURETHRAL RESECTION OF BLADDER TUMOR  07/31/2011   Procedure: TRANSURETHRAL RESECTION OF BLADDER TUMOR (TURBT);  Surgeon: Claybon Jabs, MD;  Location: [facility];  Service: Urology;  Laterality: N/A;  with [place]  . URETEROSCOPY WITH HOLMIUM LASER LITHOTRIPSY Right 11/06/2016   Procedure: URETEROSCOPY WITH HOLMIUM LASER LITHOTRIPSY/ RETROGRADE PYELOGRAM/ STENT;  Surgeon: Kathie Rhodes, MD;  Location[facility];  Service: Urology;  Laterality: Right;    Current Medications: Outpatient Medications Prior to Visit  Medication Sig Dispense Refill  . acetaminophen (TYLENOL) 500 MG tablet Take 500 mg by mouth every 6 (six) hours as needed.    Marland Kitchen amiodarone (PACERONE) 200 MG tablet TAKE ONE TABLET BY MOUTH DAILY 30 tablet 6  . amLODipine (NORVASC) 5 MG tablet TAKE ONE TABLET BY MOUTH EVERY DAY 90 tablet 3  . calcium carbonate (TUMS - DOSED IN MG ELEMENTAL CALCIUM) 500 MG chewable tablet Chew 2 tablets by mouth daily.    . cholecalciferol (VITAMIN D) 1000 units tablet Take 1,000 Units by mouth daily.    Marland Kitchen MYRBETRIQ 50 MG TB24 tablet Take 50 mg by mouth daily.  11  . nitroGLYCERIN (NITROSTAT) 0.4 MG SL tablet PLACE 1 TABLET UNDER THE TONGUE EVERY 5 MINUTES IF NEEDED FOR CHEST PAIN. MAX = 3 TABS/EPISODE. 25 tablet 4  . psyllium (HYDROCIL/METAMUCIL) 95 % PACK Take 1 packet by mouth daily. 240 each 0  . rosuvastatin (CRESTOR) 40 MG tablet Take 40 mg by mouth daily.  3  . ELIQUIS 5 MG TABS tablet TAKE ONE TABLET BY MOUTH TWICE DAILY 180 tablet 1  . furosemide (LASIX) 20 MG tablet Take 1 tablet (20 mg total) by mouth daily as needed. 30 tablet 0  . tamsulosin (FLOMAX) 0.4 MG CAPS capsule Take 1 capsule (0.4 mg total) by mouth daily after supper. 30 capsule 6   No facility-administered medications prior to  visit.      Allergies:   Patient has no known allergies.   Social History   Socioeconomic History  . Marital status: Married    Spouse name: Not on file  . Number of children: 3  . Years of education: Not on file  . Highest education level: Not on file  Occupational History  . Occupation: retired Psychologist, sport and exercise  Social Needs  . Financial resource strain: Not on file  . Food insecurity:    Worry: Not on file    Inability: Not on file  . Transportation needs:    Medical: Not on file    Non-medical: Not on file  Tobacco Use  . Smoking status: Never Smoker  .  Smokeless tobacco: Never Used  Substance and Sexual Activity  . Alcohol use: No  . Drug use: No  . Sexual activity: Not Currently  Lifestyle  . Physical activity:    Days per week: Not on file    Minutes per session: Not on file  . Stress: Not on file  Relationships  . Social connections:    Talks on phone: Not on file    Gets together: Not on file    Attends religious service: Not on file    Active member of club or organization: Not on file    Attends meetings of clubs or organizations: Not on file    Relationship status: Not on file  Other Topics Concern  . Not on file  Social History Narrative   Lives at home with wife. Retired Psychologist, sport and exercise.      Family History:  The patient's family history includes CAD (age of onset: 41) in his father; CAD (age of onset: 54) in his brother; CAD (age of onset: 66) in his brother; Prostate cancer in his brother.   ROS:   Please see the history of present illness.    ROS All other systems reviewed and are negative.   PHYSICAL EXAM:   VS:  BP (!) 136/58 (BP Location: Left Arm, Patient Position: Sitting, Cuff Size: Normal)   Pulse (!) 56   Ht 5\' 11"  (1.803 m)   Wt 237 lb (107.5 kg)   BMI 33.05 kg/m    GEN: Well nourished, well developed, in no acute distress  HEENT: normal  Neck: no JVD, carotid bruits, or masses Cardiac: RRR; no murmurs, rubs, or gallops. 3+ edema  Respiratory:   clear to auscultation bilaterally, normal work of breathing GI: soft, nontender, nondistended, + BS MS: no deformity or atrophy  Skin: warm and dry, no rash Neuro:  Alert and Oriented x 3, Strength and sensation are intact Psych: euthymic mood, full affect  Wt Readings from Last 3 Encounters:  07/21/18 237 lb (107.5 kg)  07/18/18 239 lb 8 oz (108.6 kg)  07/12/18 243 lb 14.4 oz (110.6 kg)      Studies/Labs Reviewed:   EKG:  EKG is not ordered today.   Recent Labs: 05/09/2018: TSH 3.380 07/12/2018: ALT 23 07/18/2018: Hemoglobin 9.2; Platelets 262 07/21/2018: BUN 11; Creatinine, Ser 1.05; Potassium 3.6; Sodium 146   Lipid Panel    Component Value Date/Time   CHOL 131 02/01/2014 1409   TRIG 114 02/01/2014 1409   HDL 50 02/01/2014 1409   CHOLHDL 2.6 02/01/2014 1409   VLDL 23 02/01/2014 1409   LDLCALC 58 02/01/2014 1409    Additional studies/ records that were reviewed today include:   Echo 11/01/2017 LV EF: 55% -   60% Study Conclusions  - Left ventricle: The cavity size was normal. Wall thickness was   increased in a pattern of mild LVH. Systolic function was normal.   The estimated ejection fraction was in the range of 55% to 60%.   There is akinesis of the basalinferior myocardium. Doppler   parameters are consistent with abnormal left ventricular   relaxation (grade 1 diastolic dysfunction). - Aortic valve: Valve mobility was restricted. There was mild   stenosis. - Ascending aorta: The ascending aorta was mildly dilated. - Mitral valve: Calcified annulus. - Right ventricle: The cavity size was mildly dilated. - Pulmonary arteries: Systolic pressure was mildly increased.  Impressions:  - Akinesis of the basal inferior wall with overall normal LV   systolic function; mild diastolic dysfunction;  mild LVH;   calcified aortic valve with mild AS (mean gradient 14 mmHg);   mildly dilated ascending aorta (44 mm; suggest CTA or MRA to   further assss); mild RVE; mild  TR; mild pulmonary hypertension.    ASSESSMENT:    1. Acute on chronic diastolic heart failure ([place])   2. Lower extremity edema   3. Coronary artery disease involving coronary bypass graft of native heart without angina pectoris   4. PAF (paroxysmal atrial fibrillation) ([place])   5. Essential hypertension   6. Hyperlipidemia LDL goal <70   7. Prostate cancer ([place])      PLAN:  In order of problems listed above:  1. Lower extremity edema: Increase Lasix to 40 mg daily.  Add potassium supplement 20 mEq daily.  Obtain echocardiogram.  I suspect some of his heart failure is related to recent anemia  2. CAD s/p CABG: Underwent bypass surgery 1993.  Denies any recent chest discomfort.  3. PAF: maintained in sinus rhythm on Eliquis and amiodarone. HR RRR  4. Hypertension: Blood pressure well controlled on current therapy.  5. Hyperlipidemia: On Crestor 40 mg daily    Medication Adjustments/Labs and Tests Ordered: Current medicines are reviewed at length with the patient today.  Concerns regarding medicines are outlined above.  Medication changes, Labs and Tests ordered today are listed in the Patient Instructions below. Patient Instructions  Medication Instructions:   INCREASE LASIX TO 40 MG DAILY START TAKING POTASSIUM 20 MEQ   If you need a refill on your cardiac medications before your next appointment, please call your pharmacy.   Lab work:  BMET- TODAY and in 2 weeks   If you have labs (blood work) drawn today and your tests are completely normal, you will receive your results only by: Marland Kitchen MyChart Message (if you have MyChart) OR . A paper copy in the mail If you have any lab test that is abnormal or we need to change your treatment, we will call you to review the results.  Testing/Procedures:  Your physician has requested that you have an echocardiogram. Echocardiography is a painless test that uses sound waves to create images of your heart. It provides your doctor with  information about the size and shape of your heart and how well your heart's chambers and valves are working. This procedure takes approximately one hour. There are no restrictions for this procedure.  THIS TEST IS DONE AT [facility]  Follow-Up: Your physician recommends that you follow-up as scheduled         Weston Brass Almyra Deforest, [place]  07/23/2018 11:58 PM    [facility] [place], [place], [place]  [address] Phone: [phone]; Fax: [phone]

## 2018-07-21 NOTE — Patient Instructions (Addendum)
Medication Instructions:   INCREASE LASIX TO 40 MG DAILY START TAKING POTASSIUM 20 MEQ   If you need a refill on your cardiac medications before your next appointment, please call your pharmacy.   Lab work:  BMET- TODAY and in 2 weeks   If you have labs (blood work) drawn today and your tests are completely normal, you will receive your results only by: Marland Kitchen MyChart Message (if you have MyChart) OR . A paper copy in the mail If you have any lab test that is abnormal or we need to change your treatment, we will call you to review the results.  Testing/Procedures:  Your physician has requested that you have an echocardiogram. Echocardiography is a painless test that uses sound waves to create images of your heart. It provides your doctor with information about the size and shape of your heart and how well your heart's chambers and valves are working. This procedure takes approximately one hour. There are no restrictions for this procedure.  THIS TEST IS DONE AT [facility]  Follow-Up: Your physician recommends that you follow-up as scheduled

## 2018-07-22 LAB — BASIC METABOLIC PANEL
BUN/Creatinine Ratio: 10 (ref 10–24)
BUN: 11 mg/dL (ref 8–27)
CO2: 25 mmol/L (ref 20–29)
Calcium: 9.4 mg/dL (ref 8.6–10.2)
Chloride: 105 mmol/L (ref 96–106)
Creatinine, Ser: 1.05 mg/dL (ref 0.76–1.27)
GFR calc Af Amer: 76 mL/min/{1.73_m2} (ref >59)
GFR calc non Af Amer: 65 mL/min/{1.73_m2} (ref >59)
Glucose: 94 mg/dL (ref 65–99)
Potassium: 3.6 mmol/L (ref 3.5–5.2)
Sodium: 146 mmol/L — ABNORMAL HIGH (ref 134–144)

## 2018-07-22 NOTE — Progress Notes (Signed)
Stable kidney function and electrolyte

## 2018-07-25 ENCOUNTER — Inpatient Hospital Stay: Payer: Medicare Other

## 2018-07-25 VITALS — BP 128/67 | HR 56 | Temp 98.1°F | Resp 18

## 2018-07-25 DIAGNOSIS — I4891 Unspecified atrial fibrillation: Secondary | ICD-10-CM | POA: Diagnosis not present

## 2018-07-25 DIAGNOSIS — I252 Old myocardial infarction: Secondary | ICD-10-CM | POA: Diagnosis not present

## 2018-07-25 DIAGNOSIS — R600 Localized edema: Secondary | ICD-10-CM | POA: Diagnosis not present

## 2018-07-25 DIAGNOSIS — C61 Malignant neoplasm of prostate: Secondary | ICD-10-CM | POA: Diagnosis not present

## 2018-07-25 DIAGNOSIS — D500 Iron deficiency anemia secondary to blood loss (chronic): Secondary | ICD-10-CM

## 2018-07-25 DIAGNOSIS — D509 Iron deficiency anemia, unspecified: Secondary | ICD-10-CM | POA: Diagnosis not present

## 2018-07-25 DIAGNOSIS — I10 Essential (primary) hypertension: Secondary | ICD-10-CM | POA: Diagnosis not present

## 2018-07-25 MED ORDER — IRON SUCROSE 20 MG/ML IV SOLN
200.0000 mg | Freq: Once | INTRAVENOUS | Status: AC
  Administered 2018-07-25: 200 mg via INTRAVENOUS
  Filled 2018-07-25: qty 10

## 2018-07-25 MED ORDER — SODIUM CHLORIDE 0.9 % IV SOLN
Freq: Once | INTRAVENOUS | Status: AC
  Administered 2018-07-25: 14:00:00 via INTRAVENOUS
  Filled 2018-07-25: qty 250

## 2018-07-25 NOTE — Progress Notes (Signed)
The patient has been notified of the result and verbalized understanding.  All questions (if any) were answered. Jacqulynn Cadet, [place] 07/25/2018 11:53 AM

## 2018-07-28 ENCOUNTER — Ambulatory Visit (HOSPITAL_COMMUNITY): Payer: Medicare Other | Attending: Cardiology

## 2018-07-28 DIAGNOSIS — R600 Localized edema: Secondary | ICD-10-CM | POA: Insufficient documentation

## 2018-08-01 ENCOUNTER — Inpatient Hospital Stay: Payer: Medicare Other

## 2018-08-01 VITALS — BP 115/82 | HR 62 | Temp 97.2°F | Resp 18

## 2018-08-01 DIAGNOSIS — I252 Old myocardial infarction: Secondary | ICD-10-CM | POA: Diagnosis not present

## 2018-08-01 DIAGNOSIS — C61 Malignant neoplasm of prostate: Secondary | ICD-10-CM | POA: Diagnosis not present

## 2018-08-01 DIAGNOSIS — I4891 Unspecified atrial fibrillation: Secondary | ICD-10-CM | POA: Diagnosis not present

## 2018-08-01 DIAGNOSIS — D500 Iron deficiency anemia secondary to blood loss (chronic): Secondary | ICD-10-CM

## 2018-08-01 DIAGNOSIS — I10 Essential (primary) hypertension: Secondary | ICD-10-CM | POA: Diagnosis not present

## 2018-08-01 DIAGNOSIS — D509 Iron deficiency anemia, unspecified: Secondary | ICD-10-CM | POA: Diagnosis not present

## 2018-08-01 DIAGNOSIS — R600 Localized edema: Secondary | ICD-10-CM | POA: Diagnosis not present

## 2018-08-01 MED ORDER — SODIUM CHLORIDE 0.9 % IV SOLN
Freq: Once | INTRAVENOUS | Status: AC
  Administered 2018-08-01: 14:00:00 via INTRAVENOUS
  Filled 2018-08-01: qty 250

## 2018-08-01 MED ORDER — IRON SUCROSE 20 MG/ML IV SOLN
200.0000 mg | Freq: Once | INTRAVENOUS | Status: AC
  Administered 2018-08-01: 200 mg via INTRAVENOUS
  Filled 2018-08-01: qty 10

## 2018-08-03 ENCOUNTER — Telehealth: Payer: Self-pay

## 2018-08-03 NOTE — Progress Notes (Signed)
Called to inform patient of his ECHO results, patient was not home his wife Dell Briner answered the telephone. Mrs. Dupree who is on the patient's DPR has been notified of the result and verbalized understanding.  All questions (if any) were answered. Also informed Mrs. Maser if Mr. Marcella had any questions he can call back.  Jacqulynn Cadet, CMA 08/03/2018 1:43 PM

## 2018-08-03 NOTE — Telephone Encounter (Signed)
Called to inform patient of his ECHO results, patient was not home his wife Jocelyn Lowery answered the telephone. Mrs. Wishart who is on the patient's DPR has been notified of the result and verbalized understanding. All questions (if any) were answered. Also informed Mrs. Brosnahan if Mr. Mixson had any questions he can call back.

## 2018-08-05 ENCOUNTER — Ambulatory Visit (INDEPENDENT_AMBULATORY_CARE_PROVIDER_SITE_OTHER): Payer: Medicare Other | Admitting: Physician Assistant

## 2018-08-05 ENCOUNTER — Encounter: Payer: Self-pay | Admitting: Physician Assistant

## 2018-08-05 VITALS — BP 132/64 | HR 61 | Ht 71.0 in | Wt 225.4 lb

## 2018-08-05 DIAGNOSIS — I480 Paroxysmal atrial fibrillation: Secondary | ICD-10-CM | POA: Diagnosis not present

## 2018-08-05 DIAGNOSIS — I10 Essential (primary) hypertension: Secondary | ICD-10-CM | POA: Diagnosis not present

## 2018-08-05 DIAGNOSIS — I25810 Atherosclerosis of coronary artery bypass graft(s) without angina pectoris: Secondary | ICD-10-CM

## 2018-08-05 DIAGNOSIS — E785 Hyperlipidemia, unspecified: Secondary | ICD-10-CM | POA: Diagnosis not present

## 2018-08-05 DIAGNOSIS — I5032 Chronic diastolic (congestive) heart failure: Secondary | ICD-10-CM

## 2018-08-05 MED ORDER — FUROSEMIDE 20 MG PO TABS: 20.0000 mg | ORAL_TABLET | Freq: Every day | ORAL | 0 refills | Status: DC

## 2018-08-05 NOTE — Patient Instructions (Addendum)
Medication Instructions:  OK to Continue taking 20mg  of Lasix HOWEVER if you gain more than 3lbs in a day or 5lbs in a week Evan Dorsey wants you to go back to 40mg  of Lasix a day until you get back to your normal weight  If you need a refill on your cardiac medications before your next appointment, please call your pharmacy.   Lab work: Your physician recommends that you return for lab work in: TODAY-BMET If you have labs (blood work) drawn today and your tests are completely normal, you will receive your results only by: Marland Kitchen MyChart Message (if you have MyChart) OR . A paper copy in the mail If you have any lab test that is abnormal or we need to change your treatment, we will call you to review the results.  Testing/Procedures: None   Follow-Up: At [facility], you and your health needs are our priority.  As part of our continuing mission to provide you with exceptional heart care, we have created designated Provider Care Teams.  These Care Teams include your primary Cardiologist (physician) and Advanced Practice Providers (APPs -  Physician Assistants and Nurse Practitioners) who all work together to provide you with the care you need, when you need it. You will need a follow up appointment in 3 months. You may see [name], MD or one of the following Advanced Practice Providers on your designated Care Team:   Kerin Ransom, PA-C Roby Lofts, [place] . Sande Rives, PA-C  Any Other Special Instructions Will Be Listed Below (If Applicable).

## 2018-08-05 NOTE — Progress Notes (Signed)
Cardiology Office Note    Date:  08/05/2018   ID:  Evan, Dorsey 11-28-1932, MRN [id number]  PCP:  Deland Pretty, MD  Cardiologist:  Dr. Stanford Breed   Chief Complaint  Patient presents with  . Follow-up    seen for Dr. Stanford Breed    History of Present Illness:  Evan Dorsey is a 86 y.o. male with PMH of CAD s/p CABG, PAF, HTN, HLD and history of prostate and bladder cancer.  He underwent bypass surgery 1993.  Previous cardiac catheterization in 2005 showed occluded LAD, left circumflex and RCA.  Patent LIMA to LAD, patent SVG to OM, severe stenosis in the SVG to PDA but there were collaterals from OM and PLA.  Medical therapy was recommended.  He was diagnosed with bladder cancer in 2013 and had resection.  Myoview in July 2017 showed EF 59%, no ischemia.  Echocardiogram in May 2019 showed normal LV function, akinesis of the basal inferior wall, mild diastolic dysfunction, mild aortic stenosis with mean gradient 40 mmHg, mildly dilated ascending thoracic aorta measuring 44 mm.  Patient was last seen by Dr. Stanford Breed in November 2019, he was doing well at the time.  CT angiogram of the chest was obtained on 05/17/2018 which showed ascending aorta measuring 3.9 cm in greatest diameter, otherwise no overt aneurysmal disease noted in the ascending aorta by CTA measurement.  Lab work recently showed hemoglobin of 7.1 in January 2020, MCV 76, concerning for iron deficiency anemia due to blood loss.  He is being treated with iron infusion.  Hemoglobin improved to 9.2 after the iron infusion and 1 unit PRBC.   I last saw the patient on 07/28/2018 for volume overload.  I repeated an echocardiogram which showed EF of 55 to 60%, elevated LVEDP, mild aortic stenosis dilatation of a sending aorta measuring 39 mmHg, elevated PA peak pressure at 47 mmHg.  Patient presents today for cardiology office visit.  I last saw him on 07/21/2018 and noted he had a significant lower extremity edema, therefore I  increase the Lasix to 40 mg daily.  He has lost significant amount of weight since the last office visit.  His weight today is 12 pounds lighter.  He says he did do 40 mg daily of Lasix for several days however he had very frequent urination all the way onto noon of each day, therefore in order to decrease the frequency of bathroom visits, he decided to resume his previous 20 mg daily of Lasix.  I am fine with this decision to continue on the lower dose of Lasix, however patient is aware that if his weight increased by more than 3 pounds overnight, 5 pounds in single week or 5 pounds over his baseline dry weight, he will need to take 40 mg Lasix instead to get his weight back to the baseline.  Otherwise, he only has trace amount of residual lower extremity edema on physical exam.  His lungs is clear.  He does not have significant JVD on physical exam.   Past Medical History:  Diagnosis Date  . Adenomatous polyp   . Arthritis    "left shoulder" (03/02/2013)  . Atrial fibrillation ([place])   . Bladder cancer ([place]) 07/31/2011  . CAD (coronary artery disease)    a. remote CABG b. prior cardiac cath 2005 c. Lexiscan Myoview 07/08/12 w/o evidence of ischemia, EF 53%  . Diverticulosis   . Elevated PSA   . Frequent urination    "day and night" (03/02/2013)  .  GERD (gastroesophageal reflux disease)   . Gout    "been a long time ago" (03/02/2013)  . History of kidney stones   . HLD (hyperlipidemia)   . HTN (hypertension)   . Iron deficiency anemia due to chronic blood loss 06/29/2018  . Myocardial infarction ([place]) 1990's   silent  . Prostate cancer [facility])     Past Surgical History:  Procedure Laterality Date  . CARDIAC CATHETERIZATION     total occlusion to LAD, LCX, RCA. LIMA patent, normal seq SVG to OM, severe stenosis in SVG to PDA, but collaterals to PLA from OM. RCA went to PDA. treated medically   . CARDIOVASCULAR STRESS TEST  02/10/08   6 mets, poor tolerance, no chest pain   . CATARACT  EXTRACTION W/ INTRAOCULAR LENS IMPLANT Right 2014  . CORONARY ARTERY BYPASS GRAFT  08/1991   "CABG X4" (03/02/2013)  . CYSTOSCOPY W/ STONE MANIPULATION    . ORIF ACETABULAR FRACTURE Left 04/2007   trimalleolar fracture  . TOTAL SHOULDER ARTHROPLASTY Left 03/02/2013  . TOTAL SHOULDER ARTHROPLASTY Left 03/02/2013   Procedure: LEFT TOTAL SHOULDER ARTHROPLASTY;  Surgeon: Marin Shutter, MD;  Location: [facility];  Service: Orthopedics;  Laterality: Left;  . TRANSURETHRAL RESECTION OF BLADDER TUMOR  07/31/2011   Procedure: TRANSURETHRAL RESECTION OF BLADDER TUMOR (TURBT);  Surgeon: Claybon Jabs, MD;  Location: [facility];  Service: Urology;  Laterality: N/A;  with [place]  . URETEROSCOPY WITH HOLMIUM LASER LITHOTRIPSY Right 11/06/2016   Procedure: URETEROSCOPY WITH HOLMIUM LASER LITHOTRIPSY/ RETROGRADE PYELOGRAM/ STENT;  Surgeon: Kathie Rhodes, MD;  Location[facility];  Service: Urology;  Laterality: Right;    Current Medications: Outpatient Medications Prior to Visit  Medication Sig Dispense Refill  . acetaminophen (TYLENOL) 500 MG tablet Take 500 mg by mouth every 6 (six) hours as needed.    Marland Kitchen amiodarone (PACERONE) 200 MG tablet TAKE ONE TABLET BY MOUTH DAILY 30 tablet 6  . amLODipine (NORVASC) 5 MG tablet TAKE ONE TABLET BY MOUTH EVERY DAY 90 tablet 3  . apixaban (ELIQUIS) 5 MG TABS tablet Take 1 tablet (5 mg total) by mouth 2 (two) times daily. 56 tablet 0  . calcium carbonate (TUMS - DOSED IN MG ELEMENTAL CALCIUM) 500 MG chewable tablet Chew 2 tablets by mouth daily.    . cholecalciferol (VITAMIN D) 1000 units tablet Take 1,000 Units by mouth daily.    Marland Kitchen MYRBETRIQ 50 MG TB24 tablet Take 50 mg by mouth daily.  11  . nitroGLYCERIN (NITROSTAT) 0.4 MG SL tablet PLACE 1 TABLET UNDER THE TONGUE EVERY 5 MINUTES IF NEEDED FOR CHEST PAIN. MAX = 3 TABS/EPISODE. 25 tablet 4  . potassium chloride SA (K-DUR,KLOR-CON) 20 MEQ tablet Take 1 tablet (20 mEq total) by mouth daily. 90 tablet 3  . psyllium  (HYDROCIL/METAMUCIL) 95 % PACK Take 1 packet by mouth daily. 240 each 0  . rosuvastatin (CRESTOR) 40 MG tablet Take 40 mg by mouth daily.  3  . furosemide (LASIX) 40 MG tablet Take 1 tablet (40 mg total) by mouth daily. 90 tablet 3   No facility-administered medications prior to visit.      Allergies:   Patient has no known allergies.   Social History   Socioeconomic History  . Marital status: Married    Spouse name: Not on file  . Number of children: 3  . Years of education: Not on file  . Highest education level: Not on file  Occupational History  . Occupation: retired Psychologist, sport and exercise  Social Needs  . Financial resource strain: Not on file  . Food insecurity:    Worry: Not on file    Inability: Not on file  . Transportation needs:    Medical: Not on file    Non-medical: Not on file  Tobacco Use  . Smoking status: Never Smoker  . Smokeless tobacco: Never Used  Substance and Sexual Activity  . Alcohol use: No  . Drug use: No  . Sexual activity: Not Currently  Lifestyle  . Physical activity:    Days per week: Not on file    Minutes per session: Not on file  . Stress: Not on file  Relationships  . Social connections:    Talks on phone: Not on file    Gets together: Not on file    Attends religious service: Not on file    Active member of club or organization: Not on file    Attends meetings of clubs or organizations: Not on file    Relationship status: Not on file  Other Topics Concern  . Not on file  Social History Narrative   Lives at home with wife. Retired Psychologist, sport and exercise.      Family History:  The patient's family history includes CAD (age of onset: 37) in his father; CAD (age of onset: 34) in his brother; CAD (age of onset: 79) in his brother; Prostate cancer in his brother.   ROS:   Please see the history of present illness.    ROS All other systems reviewed and are negative.   PHYSICAL EXAM:   VS:  BP 132/64   Pulse 61   Ht 5\' 11"  (1.803 m)   Wt 225 lb 6.4 oz  (102.2 kg)   SpO2 96%   BMI 31.44 kg/m    GEN: Well nourished, well developed, in no acute distress  HEENT: normal  Neck: no JVD, carotid bruits, or masses Cardiac: RRR; no murmurs, rubs, or gallops. Trace edema  Respiratory:  clear to auscultation bilaterally, normal work of breathing GI: soft, nontender, nondistended, + BS MS: no deformity or atrophy  Skin: warm and dry, no rash Neuro:  Alert and Oriented x 3, Strength and sensation are intact Psych: euthymic mood, full affect  Wt Readings from Last 3 Encounters:  08/05/18 225 lb 6.4 oz (102.2 kg)  07/21/18 237 lb (107.5 kg)  07/18/18 239 lb 8 oz (108.6 kg)      Studies/Labs Reviewed:   EKG:  EKG is not ordered today.    Recent Labs: 05/09/2018: TSH 3.380 07/12/2018: ALT 23 07/18/2018: Hemoglobin 9.2; Platelets 262 07/21/2018: BUN 11; Creatinine, Ser 1.05; Potassium 3.6; Sodium 146   Lipid Panel    Component Value Date/Time   CHOL 131 02/01/2014 1409   TRIG 114 02/01/2014 1409   HDL 50 02/01/2014 1409   CHOLHDL 2.6 02/01/2014 1409   VLDL 23 02/01/2014 1409   LDLCALC 58 02/01/2014 1409    Additional studies/ records that were reviewed today include:   Echo 07/28/2018 IMPRESSIONS    1. The left ventricle has normal systolic function, with an ejection fraction of 55-60%. The cavity size was normal. Left ventricular diastolic Doppler parameters are consistent with pseudonormalization Elevated left ventricular end-diastolic pressure  No evidence of left ventricular regional wall motion abnormalities.  2. Left atrial size was mildly dilated.  3. Right atrial size was mildly dilated.  4. The mitral valve is normal in structure. There is moderate mitral annular calcification present. No significant mitral regurgitation.  5. The tricuspid  valve is normal in structure.  6. The aortic valve is tricuspid There is moderate calcification of the aortic valve. There is mild stenosis of the aortic valve. Mean gradient 11 mmHg with  AVA 1.85 cm^2.  7. The pulmonic valve was normal in structure.  8. The aortic root is normal in size and structure.  9. There is dilatation of the ascending aorta measuring 39 mm. 10. Right atrial pressure is estimated at 8 mmHg. 11. The inferior vena cava was dilated in size with >50% respiratory variability. 12. PA systolic pressure 47 mmHg. 13. The right ventricle has normal systolic function. The cavity was mildly enlarged. There is no increase in right ventricular wall thickness. 14. No evidence of left ventricular regional wall motion abnormalities.    ASSESSMENT:    1. Chronic diastolic (congestive) heart failure ([place])   2. Coronary artery disease involving coronary bypass graft of native heart without angina pectoris   3. Essential hypertension   4. Hyperlipidemia LDL goal <70   5. PAF (paroxysmal atrial fibrillation) ([facility])      PLAN:  In order of problems listed above:  1. Chronic diastolic heart failure: He had a significant weight loss and urination after Lasix was increased to 40 mg daily.  The lower extremity edema is largely gone at this point.  He continued to have trace amount of edema.  He has self adjusted his Lasix back down to 20 mg daily due to frequency of urination.  I will continue him on the current dose of Lasix with instruction of taking 40 mg of Lasix if his weight goes 5 pounds above his dry weight.  Obtain basic metabolic panel today  2. CAD s/p CABG: Denies any recent chest pain or shortness of breath.  Recent echocardiogram showed normal ejection fraction  3. Hyperlipidemia: Continue current statin  4. Hypertension: Blood pressure stable on current therapy  5. PAF: Continue amiodarone and Eliquis.  Maintaining sinus rhythm based on physical exam.    Medication Adjustments/Labs and Tests Ordered: Current medicines are reviewed at length with the patient today.  Concerns regarding medicines are outlined above.  Medication changes, Labs and Tests  ordered today are listed in the Patient Instructions below. Patient Instructions  Medication Instructions:  OK to Continue taking 20mg  of Lasix HOWEVER if you gain more than 3lbs in a day or 5lbs in a week Evan Dorsey wants you to go back to 40mg  of Lasix a day until you get back to your normal weight  If you need a refill on your cardiac medications before your next appointment, please call your pharmacy.   Lab work: Your physician recommends that you return for lab work in: TODAY-BMET If you have labs (blood work) drawn today and your tests are completely normal, you will receive your results only by: Marland Kitchen MyChart Message (if you have MyChart) OR . A paper copy in the mail If you have any lab test that is abnormal or we need to change your treatment, we will call you to review the results.  Testing/Procedures: None   Follow-Up: At [facility], you and your health needs are our priority.  As part of our continuing mission to provide you with exceptional heart care, we have created designated Provider Care Teams.  These Care Teams include your primary Cardiologist (physician) and Advanced Practice Providers (APPs -  Physician Assistants and Nurse Practitioners) who all work together to provide you with the care you need, when you need it. You will need a follow  up appointment in 3 months. You may see [name], MD or one of the following Advanced Practice Providers on your designated Care Team:   Kerin Ransom, PA-C Roby Lofts, [place] . Sande Rives, PA-C  Any Other Special Instructions Will Be Listed Below (If Applicable).      Hilbert Corrigan, [place]  08/05/2018 1:24 PM    [facility] [address], [place], [place]  [address] Phone: [phone]; Fax: [phone]

## 2018-08-06 LAB — BASIC METABOLIC PANEL
BUN / CREAT RATIO: 16 (ref 10–24)
BUN: 19 mg/dL (ref 8–27)
CO2: 23 mmol/L (ref 20–29)
Calcium: 10 mg/dL (ref 8.6–10.2)
Chloride: 105 mmol/L (ref 96–106)
Creatinine, Ser: 1.16 mg/dL (ref 0.76–1.27)
GFR calc Af Amer: 67 mL/min/{1.73_m2} (ref >59)
GFR calc non Af Amer: 58 mL/min/{1.73_m2} — ABNORMAL LOW (ref >59)
Glucose: 79 mg/dL (ref 65–99)
Potassium: 4.2 mmol/L (ref 3.5–5.2)
SODIUM: 145 mmol/L — AB (ref 134–144)

## 2018-08-08 ENCOUNTER — Encounter: Payer: Self-pay | Admitting: Radiation Oncology

## 2018-08-08 ENCOUNTER — Other Ambulatory Visit: Payer: Self-pay

## 2018-08-08 ENCOUNTER — Ambulatory Visit
Admission: RE | Admit: 2018-08-08 | Discharge: 2018-08-08 | Disposition: A | Discharge status: Home / Self Care | Payer: Medicare Other | Source: Ambulatory Visit | Attending: Radiation Oncology | Admitting: Radiation Oncology

## 2018-08-08 ENCOUNTER — Inpatient Hospital Stay: Payer: Medicare Other

## 2018-08-08 ENCOUNTER — Other Ambulatory Visit: Payer: Self-pay | Admitting: Oncology

## 2018-08-08 VITALS — BP 125/67 | HR 61 | Resp 18 | Wt 224.0 lb

## 2018-08-08 DIAGNOSIS — I4891 Unspecified atrial fibrillation: Secondary | ICD-10-CM | POA: Diagnosis not present

## 2018-08-08 DIAGNOSIS — D500 Iron deficiency anemia secondary to blood loss (chronic): Secondary | ICD-10-CM

## 2018-08-08 DIAGNOSIS — I252 Old myocardial infarction: Secondary | ICD-10-CM | POA: Diagnosis not present

## 2018-08-08 DIAGNOSIS — D649 Anemia, unspecified: Secondary | ICD-10-CM

## 2018-08-08 DIAGNOSIS — C61 Malignant neoplasm of prostate: Secondary | ICD-10-CM | POA: Diagnosis not present

## 2018-08-08 DIAGNOSIS — D509 Iron deficiency anemia, unspecified: Secondary | ICD-10-CM | POA: Diagnosis not present

## 2018-08-08 DIAGNOSIS — I10 Essential (primary) hypertension: Secondary | ICD-10-CM | POA: Diagnosis not present

## 2018-08-08 DIAGNOSIS — R600 Localized edema: Secondary | ICD-10-CM | POA: Diagnosis not present

## 2018-08-08 LAB — FERRITIN: Ferritin: 310 ng/mL (ref 24–336)

## 2018-08-08 LAB — CBC WITH DIFFERENTIAL/PLATELET
Abs Immature Granulocytes: 0.03 10*3/uL (ref 0.00–0.07)
BASOS ABS: 0 10*3/uL (ref 0.0–0.1)
Basophils Relative: 0 %
Eosinophils Absolute: 0.1 10*3/uL (ref 0.0–0.5)
Eosinophils Relative: 1 %
HCT: 41.7 % (ref 39.0–52.0)
Hemoglobin: 11.9 g/dL — ABNORMAL LOW (ref 13.0–17.0)
Immature Granulocytes: 1 %
Lymphocytes Relative: 10 %
Lymphs Abs: 0.6 10*3/uL — ABNORMAL LOW (ref 0.7–4.0)
MCH: 24.5 pg — ABNORMAL LOW (ref 26.0–34.0)
MCHC: 28.5 g/dL — ABNORMAL LOW (ref 30.0–36.0)
MCV: 85.8 fL (ref 80.0–100.0)
Monocytes Absolute: 0.7 10*3/uL (ref 0.1–1.0)
Monocytes Relative: 12 %
NEUTROS ABS: 4.7 10*3/uL (ref 1.7–7.7)
Neutrophils Relative %: 76 %
Platelets: 255 10*3/uL (ref 150–400)
RBC: 4.86 MIL/uL (ref 4.22–5.81)
RDW: 27 % — ABNORMAL HIGH (ref 11.5–15.5)
WBC: 6.1 10*3/uL (ref 4.0–10.5)
nRBC: 0 % (ref 0.0–0.2)

## 2018-08-08 LAB — IRON AND TIBC
Iron: 82 ug/dL (ref 45–182)
Saturation Ratios: 20 % (ref 17.9–39.5)
TIBC: 412 ug/dL (ref 250–450)
UIBC: 330 ug/dL

## 2018-08-08 LAB — SAMPLE TO BLOOD BANK

## 2018-08-08 NOTE — Progress Notes (Signed)
Radiation Oncology Follow up Note  Name: Evan Dorsey   Date:   08/08/2018 MRN:  [id number] DOB: 1932/08/30    This 85 y.o. male presents to the clinic today for one-month follow-up status post IM RT radiation therapy for stage IIB adenocarcinoma the prostate.  REFERRING PROVIDER: Deland Pretty, MD  HPI: patient is an 85 year old male now out 1 month having completed IM RT radiation therapy to his prostate and pelvic nodes for a Gleason 8 (4+4) presenting the PSA of 17.2. He is seen today in routine follow-up is doing fairly well. He has more constipation very little diarrhea. He does have nocturia 2-3 and some frequency and urgency mostly in the morning..he is being followed fronto deficiency anemia status post prostate biopsy and anemia of with a hemoglobin of 7.4 back in January 2020.  COMPLICATIONS OF TREATMENT: none  FOLLOW UP COMPLIANCE: keeps appointments   PHYSICAL EXAM:  BP 125/67 (BP Location: Left Arm, Patient Position: Sitting)   Pulse 61   Resp 18   Wt 223 lb 15.8 oz (101.6 kg)   BMI 31.24 kg/m  Well-developed well-nourished patient in NAD. HEENT reveals PERLA, EOMI, discs not visualized.  Oral cavity is clear. No oral mucosal lesions are identified. Neck is clear without evidence of cervical or supraclavicular adenopathy. Lungs are clear to A&P. Cardiac examination is essentially unremarkable with regular rate and rhythm without murmur rub or thrill. Abdomen is benign with no organomegaly or masses noted. Motor sensory and DTR levels are equal and symmetric in the upper and lower extremities. Cranial nerves II through XII are grossly intact. Proprioception is intact. No peripheral adenopathy or edema is identified. No motor or sensory levels are noted. Crude visual fields are within normal range.  RADIOLOGY RESULTS: no current films for review  PLAN: present time he continues to receive transfusions and iron infusions from medical oncology. From a prostate standpoint he  is recovering nicely from his treatments. I've explained to him our PSA protocol would see him back in 3-4 months and obtain a PSA at that time. In the meantime patient knows to call with any concerns at any time.  I would like to take this opportunity to thank you for allowing me to participate in the care of your patient.Noreene Filbert, MD

## 2018-08-08 NOTE — Progress Notes (Signed)
Patient and his wife were both informed of the lab results. They both verbalized an understanding and all (if any) questions were answered.

## 2018-08-10 ENCOUNTER — Inpatient Hospital Stay: Payer: Medicare Other

## 2018-08-10 ENCOUNTER — Encounter: Payer: Self-pay | Admitting: Oncology

## 2018-08-10 ENCOUNTER — Inpatient Hospital Stay (HOSPITAL_BASED_OUTPATIENT_CLINIC_OR_DEPARTMENT_OTHER): Payer: Medicare Other | Admitting: Oncology

## 2018-08-10 ENCOUNTER — Other Ambulatory Visit: Payer: Self-pay

## 2018-08-10 VITALS — BP 126/73 | HR 61 | Temp 97.0°F | Ht 71.0 in | Wt 223.3 lb

## 2018-08-10 DIAGNOSIS — I517 Cardiomegaly: Secondary | ICD-10-CM

## 2018-08-10 DIAGNOSIS — R600 Localized edema: Secondary | ICD-10-CM | POA: Diagnosis not present

## 2018-08-10 DIAGNOSIS — Z923 Personal history of irradiation: Secondary | ICD-10-CM

## 2018-08-10 DIAGNOSIS — Z79899 Other long term (current) drug therapy: Secondary | ICD-10-CM | POA: Diagnosis not present

## 2018-08-10 DIAGNOSIS — D509 Iron deficiency anemia, unspecified: Secondary | ICD-10-CM | POA: Diagnosis not present

## 2018-08-10 DIAGNOSIS — D500 Iron deficiency anemia secondary to blood loss (chronic): Secondary | ICD-10-CM | POA: Diagnosis not present

## 2018-08-10 DIAGNOSIS — I10 Essential (primary) hypertension: Secondary | ICD-10-CM

## 2018-08-10 DIAGNOSIS — Z7901 Long term (current) use of anticoagulants: Secondary | ICD-10-CM | POA: Diagnosis not present

## 2018-08-10 DIAGNOSIS — I4891 Unspecified atrial fibrillation: Secondary | ICD-10-CM

## 2018-08-10 DIAGNOSIS — C61 Malignant neoplasm of prostate: Secondary | ICD-10-CM | POA: Diagnosis not present

## 2018-08-10 DIAGNOSIS — I252 Old myocardial infarction: Secondary | ICD-10-CM

## 2018-08-10 MED ORDER — DOCUSATE SODIUM 100 MG PO CAPS: 100.0000 mg | ORAL_CAPSULE | Freq: Every day | ORAL | 3 refills | Status: DC

## 2018-08-10 MED ORDER — FERROUS SULFATE 325 (65 FE) MG PO TBEC: 325.0000 mg | DELAYED_RELEASE_TABLET | Freq: Every day | ORAL | 3 refills | Status: DC

## 2018-08-10 NOTE — Progress Notes (Signed)
Hematology/Oncology Consult note [facility] Telephone:([phone] Fax:([phone]   Patient Care Team: Deland Pretty, MD as PCP - General (Internal Medicine) Stanford Breed Denice Bors, MD as PCP - Cardiology (Cardiology) Deland Pretty, MD (Internal Medicine)  REFERRING PROVIDER: Dr.Chrystal CHIEF COMPLAINTS/REASON FOR VISIT:  Evaluation of anemia  HISTORY OF PRESENTING ILLNESS:  Evan Dorsey is a  85 y.o.  male with PMH listed below who was referred to me for evaluation of anemia Patient was seen by radiation oncology Dr. Baruch Gouty for management of prostate cancer. 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  He was noted to have rising PSA in 2015 up to 10.9, he underwent transrectal ultrasound-guided biopsy showing a 7 of 12 cores positive for Gleason 7 (4+3)adenocarcinoma. Patient decided on active surveillance.  PSA has been rising most recently in August 2019 which was 17.2.  This prompted another trans-rectal ultrasound-guided biopsy in September 2019 and at this time with 10 out of 12 cores positive for Gleason 8 (4+4). Patient is on Eliquis for anticoagulation for atrial fibrillation had significant bleeding from prostate biopsy.  Bone scan on 03/25/2018 showed negative for osseous metastasis. 03/25/2018 CT abdomen pelvis showed no findings highly suspicious for metastatic disease in the abdomen or pelvis.  Scattered small low-attenuation liver mass are indeterminate in density and appear stable since Oct 19, 2016 CT.  More likely benign.  Mild to moderate prostato megaly Patient has been seen by medical oncology in [place] with recommendation of external beam radiation therapy. Patient has been started on radiation treatment. Was noticed that patient's hemoglobin progressively worsened.  He was referred to hematology for further management. Reviewed patient's recent labs on  06/16/2018 revealed anemia with hemoglobin of 7.4 with MCV of 76. Reviewed patient's previous labs ordered by primary care physician's office, anemia is acute onset, duration is since September 2019 when he had excessive bleeding after prostate biopsy.   Associated signs and symptoms: Patient reports fatigue.  Mild SOB with exertion.  Denies weight loss, easy bruising, hematochezia, hemoptysis, hematuria. Context: History of GI bleeding: Denies Currently on prostate radiation, with urinary symptoms including increased frequency and urgency of urination.  No dysuria.  Constipated. Patient is accompanied by wife.  INTERVAL HISTORY Evan Dorsey is a 85 y.o. male who has above history reviewed by me today presents for follow up of iron deficiency anemia.  He is s/p blood transfusion and IV Venofer infusion.  Finished RT for prostate cancer treatment 4 weeks ago.  Diarrhea has resolved.  On Eliquis for chronic anticoagulation for atrial fibrillation.Denies hematochezia, hematuria, hematemesis, epistaxis, black tarry stool.   Fatigue has improved.    Review of Systems  Constitutional: Negative for appetite change, chills, fatigue, fever and unexpected weight change.  HENT:   Negative for hearing loss and voice change.   Eyes: Negative for eye problems and icterus.  Respiratory: Negative for chest tightness, cough and shortness of breath.   Cardiovascular: Negative for chest pain and leg swelling.  Gastrointestinal: Negative for abdominal distention and abdominal pain.  Endocrine: Negative for hot flashes.  Genitourinary: Negative for difficulty urinating, dysuria and frequency.   Musculoskeletal: Negative for arthralgias.  Skin: Negative for itching and rash.  Neurological: Negative for light-headedness and numbness.  Hematological: Negative for adenopathy. Does not bruise/bleed easily.  Psychiatric/Behavioral: Negative for confusion.   MEDICAL HISTORY:  Past Medical History:    Diagnosis Date  . Adenomatous polyp   .  Arthritis    "left shoulder" (03/02/2013)  . Atrial fibrillation ([place])   . Bladder cancer ([place]) 07/31/2011  . CAD (coronary artery disease)    a. remote CABG b. prior cardiac cath 2005 c. Lexiscan Myoview 07/08/12 w/o evidence of ischemia, EF 53%  . Diverticulosis   . Elevated PSA   . Frequent urination    "day and night" (03/02/2013)  . GERD (gastroesophageal reflux disease)   . Gout    "been a long time ago" (03/02/2013)  . History of kidney stones   . HLD (hyperlipidemia)   . HTN (hypertension)   . Iron deficiency anemia due to chronic blood loss 06/29/2018  . Myocardial infarction ([place]) 1990's   silent  . Prostate cancer [facility])     SURGICAL HISTORY: Past Surgical History:  Procedure Laterality Date  . CARDIAC CATHETERIZATION     total occlusion to LAD, LCX, RCA. LIMA patent, normal seq SVG to OM, severe stenosis in SVG to PDA, but collaterals to PLA from OM. RCA went to PDA. treated medically   . CARDIOVASCULAR STRESS TEST  02/10/08   6 mets, poor tolerance, no chest pain   . CATARACT EXTRACTION W/ INTRAOCULAR LENS IMPLANT Right 2014  . CORONARY ARTERY BYPASS GRAFT  08/1991   "CABG X4" (03/02/2013)  . CYSTOSCOPY W/ STONE MANIPULATION    . ORIF ACETABULAR FRACTURE Left 04/2007   trimalleolar fracture  . TOTAL SHOULDER ARTHROPLASTY Left 03/02/2013  . TOTAL SHOULDER ARTHROPLASTY Left 03/02/2013   Procedure: LEFT TOTAL SHOULDER ARTHROPLASTY;  Surgeon: Marin Shutter, MD;  Location: [place];  Service: Orthopedics;  Laterality: Left;  . TRANSURETHRAL RESECTION OF BLADDER TUMOR  07/31/2011   Procedure: TRANSURETHRAL RESECTION OF BLADDER TUMOR (TURBT);  Surgeon: Claybon Jabs, MD;  [facility];  Service: Urology;  Laterality: N/A;  with Gyrus  . URETEROSCOPY WITH HOLMIUM LASER LITHOTRIPSY Right 11/06/2016   Procedure: URETEROSCOPY WITH HOLMIUM LASER LITHOTRIPSY/ RETROGRADE PYELOGRAM/ STENT;  Surgeon: Kathie Rhodes, MD;  Location: [facility];  Service: Urology;  Laterality: Right;    SOCIAL HISTORY: Social History   Socioeconomic History  . Marital status: Married    Spouse name: Not on file  . Number of children: 3  . Years of education: Not on file  . Highest education level: Not on file  Occupational History  . Occupation: retired Psychologist, sport and exercise  Social Needs  . Financial resource strain: Not on file  . Food insecurity:    Worry: Not on file    Inability: Not on file  . Transportation needs:    Medical: Not on file    Non-medical: Not on file  Tobacco Use  . Smoking status: Never Smoker  . Smokeless tobacco: Never Used  Substance and Sexual Activity  . Alcohol use: No  . Drug use: No  . Sexual activity: Not Currently  Lifestyle  . Physical activity:    Days per week: Not on file    Minutes per session: Not on file  . Stress: Not on file  Relationships  . Social connections:    Talks on phone: Not on file    Gets together: Not on file    Attends religious service: Not on file    Active member of club or organization: Not on file    Attends meetings of clubs or organizations: Not on file    Relationship status: Not on file  . Intimate partner violence:    Fear of current or ex partner: Not on file  Emotionally abused: Not on file    Physically abused: Not on file    Forced sexual activity: Not on file  Other Topics Concern  . Not on file  Social History Narrative   Lives at home with wife. Retired Psychologist, sport and exercise.     FAMILY HISTORY: Family History  Problem Relation Age of Onset  . CAD Father 53  . Prostate cancer Brother   . CAD Brother 68  . CAD Brother 65  . Breast cancer Neg Hx   . Colon cancer Neg Hx   . Pancreatic cancer Neg Hx     ALLERGIES:  has No Known Allergies.  MEDICATIONS:  Current Outpatient Medications  Medication Sig Dispense Refill  . acetaminophen (TYLENOL) 500 MG tablet Take 500 mg by mouth every 6 (six) hours as needed.    Marland Kitchen amiodarone (PACERONE) 200 MG tablet  TAKE ONE TABLET BY MOUTH DAILY 30 tablet 6  . amLODipine (NORVASC) 5 MG tablet TAKE ONE TABLET BY MOUTH EVERY DAY 90 tablet 3  . apixaban (ELIQUIS) 5 MG TABS tablet Take 1 tablet (5 mg total) by mouth 2 (two) times daily. 56 tablet 0  . calcium carbonate (TUMS - DOSED IN MG ELEMENTAL CALCIUM) 500 MG chewable tablet Chew 2 tablets by mouth daily.    . cholecalciferol (VITAMIN D) 1000 units tablet Take 1,000 Units by mouth daily.    . furosemide (LASIX) 20 MG tablet Take 1 tablet (20 mg total) by mouth daily. 30 tablet 0  . MYRBETRIQ 50 MG TB24 tablet Take 50 mg by mouth daily.  11  . nitroGLYCERIN (NITROSTAT) 0.4 MG SL tablet PLACE 1 TABLET UNDER THE TONGUE EVERY 5 MINUTES IF NEEDED FOR CHEST PAIN. MAX = 3 TABS/EPISODE. 25 tablet 4  . potassium chloride SA (K-DUR,KLOR-CON) 20 MEQ tablet Take 1 tablet (20 mEq total) by mouth daily. 90 tablet 3  . psyllium (HYDROCIL/METAMUCIL) 95 % PACK Take 1 packet by mouth daily. 240 each 0  . rosuvastatin (CRESTOR) 40 MG tablet Take 40 mg by mouth daily.  3  . docusate sodium (COLACE) 100 MG capsule Take 1 capsule (100 mg total) by mouth daily. Do not take if you have loose stools 30 capsule 3  . ferrous sulfate 325 (65 FE) MG EC tablet Take 1 tablet (325 mg total) by mouth daily. 30 tablet 3   No current facility-administered medications for this visit.      PHYSICAL EXAMINATION: ECOG PERFORMANCE STATUS: 1 - Symptomatic but completely ambulatory Vitals:   08/10/18 1303  BP: 126/73  Pulse: 61  Temp: (!) 97 F (36.1 C)   Filed Weights   08/10/18 1303  Weight: 223 lb 5 oz (101.3 kg)    Physical Exam Constitutional:      General: He is not in acute distress. HENT:     Head: Normocephalic and atraumatic.  Eyes:     General: No scleral icterus.    Pupils: Pupils are equal, round, and reactive to light.  Neck:     Musculoskeletal: Normal range of motion and neck supple.  Cardiovascular:     Rate and Rhythm: Normal rate and regular rhythm.      Heart sounds: Normal heart sounds.  Pulmonary:     Effort: Pulmonary effort is normal. No respiratory distress.     Breath sounds: No wheezing.  Abdominal:     General: Bowel sounds are normal. There is no distension.     Palpations: Abdomen is soft. There is no mass.  Tenderness: There is no abdominal tenderness.  Musculoskeletal: Normal range of motion.        General: No deformity.     Comments: Bilateral lower extremity trace edema.   Skin:    General: Skin is warm and dry.     Coloration: Skin is not pale.     Findings: No erythema or rash.  Neurological:     Mental Status: He is alert and oriented to person, place, and time.     Cranial Nerves: No cranial nerve deficit.     Coordination: Coordination normal.  Psychiatric:        Behavior: Behavior normal.        Thought Content: Thought content normal.      LABORATORY DATA:  I have reviewed the data as listed Lab Results  Component Value Date   WBC 6.1 08/08/2018   HGB 11.9 (L) 08/08/2018   HCT 41.7 08/08/2018   MCV 85.8 08/08/2018   PLT 255 08/08/2018   Recent Labs    03/08/18 0403  06/28/18 1445 07/12/18 1456 07/21/18 1428 08/05/18 1211  NA 144   < > 143 143 146* 145*  K 3.4*   < > 3.6 3.5 3.6 4.2  CL 114*   < > 109 109 105 105  CO2 21*   < > 26 26 25 23   GLUCOSE 113*   < > 114* 105* 94 79  BUN 17   < > 19 14 11 19   CREATININE 1.12   < > 1.23 1.19 1.05 1.16  CALCIUM 8.4*   < > 9.4 9.3 9.4 10.0  GFRNONAA 59*   < > 54* 56* 65 58*  GFRAA >60   < > >60 >60 76 67  PROT 5.3*  --  6.5 6.7  --   --   ALBUMIN 2.8*  --  3.6 3.6  --   --   AST 34  --  28 29  --   --   ALT 35  --  23 23  --   --   ALKPHOS 34*  --  53 54  --   --   BILITOT 0.5  --  0.6 0.7  --   --    < > = values in this interval not displayed.   Iron/TIBC/Ferritin/ %Sat    Component Value Date/Time   IRON 82 08/08/2018 1253   TIBC 412 08/08/2018 1253   FERRITIN 310 08/08/2018 1253   IRONPCTSAT 20 08/08/2018 1253     RADIOGRAPHIC  STUDIES: I have personally reviewed the radiological images as listed and agreed with the findings in the report. 05/17/2018 CT angio chest IMPRESSION: 1. No overt aneurysmal disease of the ascending thoracic aorta by CTA measurement. The ascending aorta is top normal in caliber and measures 3.9 cm in greatest diameter. 2. Normal variant separate origin of the left vertebral artery off of the aortic arch. Bone scan on 03/25/2018 showed negative for osseous metastasis. 03/25/2018 CT abdomen pelvis showed no findings highly suspicious for metastatic disease in the abdomen or pelvis.  Scattered small low-attenuation liver mass are indeterminate in density and appear stable since Oct 19, 2016 CT.  More likely benign.  Mild to moderate prostato megaly   ASSESSMENT & PLAN:  1. Iron deficiency anemia due to chronic blood loss   2. Prostate cancer [facility])    #Labs are reviewed and discussed with patient. Hemoglobin and Iron store have both improved. Clinically doing better.  Hold additional IV iron at this point.  Recommend start oral ferrous sulfate 325mg  daily with colace for constipation prophylaxis. Rx sent to pharmacy.   #Prostate cancer status post radiation.follows up with Rad Onc.   All questions were answered. The patient knows to call the clinic with any problems questions or concerns.  Return of visit:  4 months.   Earlie Server, MD, PhD Hematology Oncology [facility] at [facility] Pager- [phone] 08/10/2018

## 2018-08-10 NOTE — Progress Notes (Signed)
Patient here today for follow up.  Patient states that he feels much better, no SOB, no fatigue, appetite is getting better.

## 2018-09-29 ENCOUNTER — Other Ambulatory Visit: Payer: Self-pay | Admitting: Cardiology

## 2018-09-29 NOTE — Telephone Encounter (Signed)
Amiodarone 200 mg refilled. 

## 2018-10-04 ENCOUNTER — Other Ambulatory Visit: Payer: Self-pay | Admitting: Cardiology

## 2018-10-10 ENCOUNTER — Other Ambulatory Visit: Payer: Self-pay | Admitting: Cardiology

## 2018-10-10 NOTE — Telephone Encounter (Signed)
Pacerone 200 mg refilled.

## 2018-10-14 ENCOUNTER — Encounter: Payer: Self-pay | Admitting: *Deleted

## 2018-11-01 ENCOUNTER — Telehealth: Payer: Self-pay | Admitting: Cardiology

## 2018-11-01 DIAGNOSIS — C61 Malignant neoplasm of prostate: Secondary | ICD-10-CM | POA: Diagnosis not present

## 2018-11-02 ENCOUNTER — Other Ambulatory Visit: Payer: Medicare Other

## 2018-11-02 NOTE — Progress Notes (Signed)
Virtual Visit via Video Note changed to phone visit at patient request as no smart phone   This visit type was conducted due to national recommendations for restrictions regarding the COVID-19 Pandemic (e.g. social distancing) in an effort to limit this patient's exposure and mitigate transmission in our community.  Due to his co-morbid illnesses, this patient is at least at moderate risk for complications without adequate follow up.  This format is felt to be most appropriate for this patient at this time.  All issues noted in this document were discussed and addressed.  A limited physical exam was performed with this format.  Please refer to the patient's chart for his consent to telehealth for [facility].   Date:  11/03/2018   ID:  Evan Dorsey, DOB June 08, 1933, MRN [id number]  Patient Location: Home Provider Location: Home  PCP:  Deland Pretty, MD  Cardiologist:  [name], MD   Evaluation Performed:  Follow-Up Visit  Chief Complaint:  FU CAD  History of Present Illness:    Followup coronary artery disease status post coronary artery bypassing graft and atrial fibrillation. Patient had previous coronary artery bypassing graft in 1993. Last cardiac catheterization in 2005 showed occluded LAD, circumflex and right coronary artery. LIMA to the LAD was patent, saphenous vein graft to obtuse marginal patent. The saphenous vein graft to the PDA had a severe stenosis but there were collaterals from the obtuse marginal and PLA. Medical therapy recommended. Nuclear study7/17 showed EF 59 and no ischemia.  CTA December 2019 showed mildly dilated aortic root at 3.9 cm.  Echocardiogram February 2020 showed normal LV function, moderate diastolic dysfunction, mild biatrial enlargement, mild aortic stenosis with mean gradient 11 mmHg and mildly dilated aortic root.  Patient seen in February with excess volume and was treated with diuretic.  Since he was last seen,the patient has dyspnea  with more extreme activities but not with routine activities. It is relieved with rest. It is not associated with chest pain. There is no orthopnea, PND or pedal edema. There is no syncope or palpitations. There is no exertional chest pain.   The patient does not have symptoms concerning for COVID-19 infection (fever, chills, cough, or new shortness of breath).    Past Medical History:  Diagnosis Date  . Adenomatous polyp   . Arthritis    "left shoulder" (03/02/2013)  . Atrial fibrillation ([place])   . Bladder cancer ([place]) 07/31/2011  . CAD (coronary artery disease)    a. remote CABG b. prior cardiac cath 2005 c. Lexiscan Myoview 07/08/12 w/o evidence of ischemia, EF 53%  . Diverticulosis   . Elevated PSA   . Frequent urination    "day and night" (03/02/2013)  . GERD (gastroesophageal reflux disease)   . Gout    "been a long time ago" (03/02/2013)  . History of kidney stones   . HLD (hyperlipidemia)   . HTN (hypertension)   . Iron deficiency anemia due to chronic blood loss 06/29/2018  . Myocardial infarction ([place]) 1990's   silent  . Prostate cancer [facility])    Past Surgical History:  Procedure Laterality Date  . CARDIAC CATHETERIZATION     total occlusion to LAD, LCX, RCA. LIMA patent, normal seq SVG to OM, severe stenosis in SVG to PDA, but collaterals to PLA from OM. RCA went to PDA. treated medically   . CARDIOVASCULAR STRESS TEST  02/10/08   6 mets, poor tolerance, no chest pain   . CATARACT EXTRACTION W/ INTRAOCULAR LENS IMPLANT Right  2014  . CORONARY ARTERY BYPASS GRAFT  08/1991   "CABG X4" (03/02/2013)  . CYSTOSCOPY W/ STONE MANIPULATION    . ORIF ACETABULAR FRACTURE Left 04/2007   trimalleolar fracture  . TOTAL SHOULDER ARTHROPLASTY Left 03/02/2013  . TOTAL SHOULDER ARTHROPLASTY Left 03/02/2013   Procedure: LEFT TOTAL SHOULDER ARTHROPLASTY;  Surgeon: Marin Shutter, MD;  Location: [place];  Service: Orthopedics;  Laterality: Left;  . TRANSURETHRAL RESECTION OF BLADDER TUMOR   07/31/2011   Procedure: TRANSURETHRAL RESECTION OF BLADDER TUMOR (TURBT);  Surgeon: Claybon Jabs, MD;  [facility];  Service: Urology;  Laterality: N/A;  with [place]  . URETEROSCOPY WITH HOLMIUM LASER LITHOTRIPSY Right 11/06/2016   Procedure: URETEROSCOPY WITH HOLMIUM LASER LITHOTRIPSY/ RETROGRADE PYELOGRAM/ STENT;  Surgeon: Kathie Rhodes, MD;  Location: [facility];  Service: Urology;  Laterality: Right;     Current Meds  Medication Sig  . acetaminophen (TYLENOL) 500 MG tablet Take 500 mg by mouth every 6 (six) hours as needed.  Marland Kitchen amiodarone (PACERONE) 200 MG tablet TAKE ONE TABLET BY MOUTH DAILY  . amLODipine (NORVASC) 5 MG tablet TAKE ONE TABLET BY MOUTH EVERY DAY  . apixaban (ELIQUIS) 5 MG TABS tablet Take 1 tablet (5 mg total) by mouth 2 (two) times daily.  . calcium carbonate (TUMS - DOSED IN MG ELEMENTAL CALCIUM) 500 MG chewable tablet Chew 2 tablets by mouth daily.  . cholecalciferol (VITAMIN D) 1000 units tablet Take 1,000 Units by mouth daily.  Marland Kitchen docusate sodium (COLACE) 100 MG capsule Take 1 capsule (100 mg total) by mouth daily. Do not take if you have loose stools  . ferrous sulfate 325 (65 FE) MG EC tablet Take 1 tablet (325 mg total) by mouth daily.  . furosemide (LASIX) 20 MG tablet Take 1 tablet (20 mg total) by mouth daily.  Marland Kitchen MYRBETRIQ 50 MG TB24 tablet Take 50 mg by mouth daily.  . nitroGLYCERIN (NITROSTAT) 0.4 MG SL tablet PLACE 1 TABLET UNDER THE TONGUE EVERY 5 MINUTES IF NEEDED FOR CHEST PAIN. MAX = 3 TABS/EPISODE.  Marland Kitchen psyllium (HYDROCIL/METAMUCIL) 95 % PACK Take 1 packet by mouth daily.  . rosuvastatin (CRESTOR) 40 MG tablet Take 40 mg by mouth daily.     Allergies:   Patient has no known allergies.   Social History   Tobacco Use  . Smoking status: Never Smoker  . Smokeless tobacco: Never Used  Substance Use Topics  . Alcohol use: No  . Drug use: No     Family Hx: The patient's family history includes CAD (age of onset: 46) in his father;  CAD (age of onset: 73) in his brother; CAD (age of onset: 64) in his brother; Prostate cancer in his brother. There is no history of Breast cancer, Colon cancer, or Pancreatic cancer.  ROS:   Please see the history of present illness.    No F/C or productive cough All other systems reviewed and are negative.  Recent Labs: 05/09/2018: TSH 3.380 07/12/2018: ALT 23 08/05/2018: BUN 19; Creatinine, Ser 1.16; Potassium 4.2; Sodium 145 08/08/2018: Hemoglobin 11.9; Platelets 255   Recent Lipid Panel Lab Results  Component Value Date/Time   CHOL 131 02/01/2014 02:09 PM   TRIG 114 02/01/2014 02:09 PM   HDL 50 02/01/2014 02:09 PM   CHOLHDL 2.6 02/01/2014 02:09 PM   LDLCALC 58 02/01/2014 02:09 PM    Wt Readings from Last 3 Encounters:  11/03/18 216 lb (98 kg)  08/10/18 223 lb 5 oz (101.3 kg)  08/08/18 223  lb 15.8 oz (101.6 kg)     Objective:    Vital Signs:  BP 129/63   Ht 5\' 11"  (1.803 m)   Wt 216 lb (98 kg)   BMI 30.13 kg/m    VITAL SIGNS:  reviewed  No acute distress Answers questions appropriately Normal affect Remainder of physical examination not performed (telehealth visit; coronavirus pandemic)  ASSESSMENT & PLAN:    1. Coronary artery disease status post coronary artery bypass graft-patient denies chest pain.  Continue medical therapy.  Continue statin.  No aspirin given need for anticoagulation. 2. Paroxysmal atrial fibrillation-no recurrences by history.  Continue amiodarone.  Check TSH, liver functions and chest x-ray.  Continue apixaban. 3. Hypertension-blood pressure is controlled today.  Continue present medications. 4. Hyperlipidemia-continue statin. 5. Thoracic aortic aneurysm-aorta upper limits of normal on most recent CTA. 6. Mild aortic stenosis-he will need follow-up echoes in the future. 7. Chronic diastolic congestive heart failure-continue present dose of diuretic.  COVID-19 Education: The importance of social distancing was discussed today.  Time:    Today, I have spent 12 minutes with the patient with telehealth technology discussing the above problems.     Medication Adjustments/Labs and Tests Ordered: Current medicines are reviewed at length with the patient today.  Concerns regarding medicines are outlined above.   Tests Ordered: No orders of the defined types were placed in this encounter.   Medication Changes: No orders of the defined types were placed in this encounter.   Disposition:  Follow up in 6 month(s)  Signed, [name], MD  11/03/2018 1:44 PM    [facility]

## 2018-11-03 ENCOUNTER — Telehealth (INDEPENDENT_AMBULATORY_CARE_PROVIDER_SITE_OTHER): Payer: Medicare Other | Admitting: Cardiology

## 2018-11-03 ENCOUNTER — Encounter: Payer: Self-pay | Admitting: Cardiology

## 2018-11-03 VITALS — BP 129/63 | Ht 71.0 in | Wt 216.0 lb

## 2018-11-03 DIAGNOSIS — I480 Paroxysmal atrial fibrillation: Secondary | ICD-10-CM

## 2018-11-03 DIAGNOSIS — I2510 Atherosclerotic heart disease of native coronary artery without angina pectoris: Secondary | ICD-10-CM | POA: Diagnosis not present

## 2018-11-03 DIAGNOSIS — I10 Essential (primary) hypertension: Secondary | ICD-10-CM

## 2018-11-03 DIAGNOSIS — E7800 Pure hypercholesterolemia, unspecified: Secondary | ICD-10-CM

## 2018-11-03 NOTE — Patient Instructions (Signed)
Medication Instructions:  NO CHANGE If you need a refill on your cardiac medications before your next appointment, please call your pharmacy.   Lab work: Your physician recommends that you return for lab work ONE WEEK If you have labs (blood work) drawn today and your tests are completely normal, you will receive your results only by: Marland Kitchen MyChart Message (if you have MyChart) OR . A paper copy in the mail If you have any lab test that is abnormal or we need to change your treatment, we will call you to review the results.  Testing/Procedures: A chest x-ray takes a picture of the organs and structures inside the chest, including the heart, lungs, and blood vessels. This test can show several things, including, whether the heart is enlarges; whether fluid is building up in the lungs; and whether pacemaker / defibrillator leads are still in place. [address]  Follow-Up: At [facility], you and your health needs are our priority.  As part of our continuing mission to provide you with exceptional heart care, we have created designated Provider Care Teams.  These Care Teams include your primary Cardiologist (physician) and Advanced Practice Providers (APPs -  Physician Assistants and Nurse Practitioners) who all work together to provide you with the care you need, when you need it. You will need a follow up appointment in 6 months.  Please call our office 2 months in advance to schedule this appointment.  You may see [name], MD or one of the following Advanced Practice Providers on your designated Care Team:   Kerin Ransom, PA-C Roby Lofts, [place] . Sande Rives, PA-C

## 2018-11-08 ENCOUNTER — Other Ambulatory Visit: Payer: Self-pay

## 2018-11-08 ENCOUNTER — Ambulatory Visit
Admission: RE | Admit: 2018-11-08 | Discharge: 2018-11-08 | Disposition: A | Discharge status: Home / Self Care | Payer: Medicare Other | Source: Ambulatory Visit | Attending: Cardiology | Admitting: Cardiology

## 2018-11-08 DIAGNOSIS — Z8551 Personal history of malignant neoplasm of bladder: Secondary | ICD-10-CM | POA: Diagnosis not present

## 2018-11-08 DIAGNOSIS — C61 Malignant neoplasm of prostate: Secondary | ICD-10-CM | POA: Diagnosis not present

## 2018-11-08 DIAGNOSIS — Z5111 Encounter for antineoplastic chemotherapy: Secondary | ICD-10-CM | POA: Diagnosis not present

## 2018-11-08 DIAGNOSIS — I480 Paroxysmal atrial fibrillation: Secondary | ICD-10-CM

## 2018-11-08 LAB — BASIC METABOLIC PANEL
BUN/Creatinine Ratio: 11 (ref 10–24)
BUN: 14 mg/dL (ref 8–27)
CO2: 24 mmol/L (ref 20–29)
Calcium: 9.8 mg/dL (ref 8.6–10.2)
Chloride: 102 mmol/L (ref 96–106)
Creatinine, Ser: 1.26 mg/dL (ref 0.76–1.27)
GFR calc Af Amer: 60 mL/min/{1.73_m2} (ref >59)
GFR calc non Af Amer: 52 mL/min/{1.73_m2} — ABNORMAL LOW (ref >59)
Glucose: 99 mg/dL (ref 65–99)
Potassium: 4.2 mmol/L (ref 3.5–5.2)
Sodium: 143 mmol/L (ref 134–144)

## 2018-11-08 LAB — HEPATIC FUNCTION PANEL
ALT: 55 IU/L — ABNORMAL HIGH (ref 0–44)
AST: 65 IU/L — ABNORMAL HIGH (ref 0–40)
Albumin: 4 g/dL (ref 3.6–4.6)
Alkaline Phosphatase: 88 IU/L (ref 39–117)
Bilirubin Total: 0.4 mg/dL (ref 0.0–1.2)
Bilirubin, Direct: 0.16 mg/dL (ref 0.00–0.40)
Total Protein: 6.6 g/dL (ref 6.0–8.5)

## 2018-11-08 LAB — CBC
Hematocrit: 43.6 % (ref 37.5–51.0)
Hemoglobin: 14.7 g/dL (ref 13.0–17.7)
MCH: 31.9 pg (ref 26.6–33.0)
MCHC: 33.7 g/dL (ref 31.5–35.7)
MCV: 95 fL (ref 79–97)
Platelets: 233 10*3/uL (ref 150–450)
RBC: 4.61 x10E6/uL (ref 4.14–5.80)
RDW: 14 % (ref 11.6–15.4)
WBC: 6.3 10*3/uL (ref 3.4–10.8)

## 2018-11-08 LAB — TSH: TSH: 3.86 u[IU]/mL (ref 0.450–4.500)

## 2018-11-09 ENCOUNTER — Ambulatory Visit: Payer: Medicare Other | Admitting: Radiation Oncology

## 2018-11-09 ENCOUNTER — Encounter: Payer: Self-pay | Admitting: *Deleted

## 2018-11-16 DIAGNOSIS — E7800 Pure hypercholesterolemia, unspecified: Secondary | ICD-10-CM | POA: Diagnosis not present

## 2018-11-16 DIAGNOSIS — I10 Essential (primary) hypertension: Secondary | ICD-10-CM | POA: Diagnosis not present

## 2018-11-23 DIAGNOSIS — E876 Hypokalemia: Secondary | ICD-10-CM | POA: Diagnosis not present

## 2018-11-23 DIAGNOSIS — M6208 Separation of muscle (nontraumatic), other site: Secondary | ICD-10-CM | POA: Diagnosis not present

## 2018-11-23 DIAGNOSIS — J309 Allergic rhinitis, unspecified: Secondary | ICD-10-CM | POA: Diagnosis not present

## 2018-11-23 DIAGNOSIS — I2510 Atherosclerotic heart disease of native coronary artery without angina pectoris: Secondary | ICD-10-CM | POA: Diagnosis not present

## 2018-11-23 DIAGNOSIS — L570 Actinic keratosis: Secondary | ICD-10-CM | POA: Diagnosis not present

## 2018-11-23 DIAGNOSIS — E7800 Pure hypercholesterolemia, unspecified: Secondary | ICD-10-CM | POA: Diagnosis not present

## 2018-11-23 DIAGNOSIS — I4891 Unspecified atrial fibrillation: Secondary | ICD-10-CM | POA: Diagnosis not present

## 2018-11-23 DIAGNOSIS — H6123 Impacted cerumen, bilateral: Secondary | ICD-10-CM | POA: Diagnosis not present

## 2018-11-23 DIAGNOSIS — I10 Essential (primary) hypertension: Secondary | ICD-10-CM | POA: Diagnosis not present

## 2018-11-23 DIAGNOSIS — N401 Enlarged prostate with lower urinary tract symptoms: Secondary | ICD-10-CM | POA: Diagnosis not present

## 2018-11-23 DIAGNOSIS — B354 Tinea corporis: Secondary | ICD-10-CM | POA: Diagnosis not present

## 2018-11-28 DIAGNOSIS — L299 Pruritus, unspecified: Secondary | ICD-10-CM | POA: Diagnosis not present

## 2018-11-29 ENCOUNTER — Other Ambulatory Visit: Payer: Self-pay | Admitting: *Deleted

## 2018-11-29 MED ORDER — FERROUS SULFATE 325 (65 FE) MG PO TBEC: 325.0000 mg | DELAYED_RELEASE_TABLET | Freq: Every day | ORAL | 3 refills | Status: DC

## 2018-11-29 MED ORDER — DOCUSATE SODIUM 100 MG PO CAPS: 100.0000 mg | ORAL_CAPSULE | Freq: Every day | ORAL | 3 refills | Status: DC

## 2018-12-07 DIAGNOSIS — E876 Hypokalemia: Secondary | ICD-10-CM | POA: Diagnosis not present

## 2018-12-08 ENCOUNTER — Other Ambulatory Visit: Payer: Self-pay

## 2018-12-09 ENCOUNTER — Encounter: Payer: Self-pay | Admitting: Oncology

## 2018-12-09 ENCOUNTER — Inpatient Hospital Stay: Payer: Medicare Other | Attending: Oncology

## 2018-12-09 ENCOUNTER — Other Ambulatory Visit: Payer: Self-pay

## 2018-12-09 ENCOUNTER — Inpatient Hospital Stay (HOSPITAL_BASED_OUTPATIENT_CLINIC_OR_DEPARTMENT_OTHER): Payer: Medicare Other | Admitting: Oncology

## 2018-12-09 VITALS — BP 143/76 | HR 59 | Temp 98.3°F | Resp 18 | Wt 222.8 lb

## 2018-12-09 DIAGNOSIS — Z7901 Long term (current) use of anticoagulants: Secondary | ICD-10-CM | POA: Diagnosis not present

## 2018-12-09 DIAGNOSIS — C61 Malignant neoplasm of prostate: Secondary | ICD-10-CM

## 2018-12-09 DIAGNOSIS — D500 Iron deficiency anemia secondary to blood loss (chronic): Secondary | ICD-10-CM

## 2018-12-09 DIAGNOSIS — Z923 Personal history of irradiation: Secondary | ICD-10-CM | POA: Diagnosis not present

## 2018-12-09 DIAGNOSIS — Z8042 Family history of malignant neoplasm of prostate: Secondary | ICD-10-CM

## 2018-12-09 DIAGNOSIS — I10 Essential (primary) hypertension: Secondary | ICD-10-CM | POA: Insufficient documentation

## 2018-12-09 DIAGNOSIS — I4891 Unspecified atrial fibrillation: Secondary | ICD-10-CM | POA: Diagnosis not present

## 2018-12-09 DIAGNOSIS — R160 Hepatomegaly, not elsewhere classified: Secondary | ICD-10-CM

## 2018-12-09 LAB — CBC WITH DIFFERENTIAL/PLATELET
Abs Immature Granulocytes: 0.26 10*3/uL — ABNORMAL HIGH (ref 0.00–0.07)
Basophils Absolute: 0 10*3/uL (ref 0.0–0.1)
Basophils Relative: 0 %
Eosinophils Absolute: 0.1 10*3/uL (ref 0.0–0.5)
Eosinophils Relative: 1 %
HCT: 47.5 % (ref 39.0–52.0)
Hemoglobin: 15.5 g/dL (ref 13.0–17.0)
Immature Granulocytes: 2 %
Lymphocytes Relative: 10 %
Lymphs Abs: 1.2 10*3/uL (ref 0.7–4.0)
MCH: 31.4 pg (ref 26.0–34.0)
MCHC: 32.6 g/dL (ref 30.0–36.0)
MCV: 96.2 fL (ref 80.0–100.0)
Monocytes Absolute: 1.4 10*3/uL — ABNORMAL HIGH (ref 0.1–1.0)
Monocytes Relative: 11 %
Neutro Abs: 9.3 10*3/uL — ABNORMAL HIGH (ref 1.7–7.7)
Neutrophils Relative %: 76 %
Platelets: 223 10*3/uL (ref 150–400)
RBC: 4.94 MIL/uL (ref 4.22–5.81)
RDW: 13.7 % (ref 11.5–15.5)
WBC: 12.2 10*3/uL — ABNORMAL HIGH (ref 4.0–10.5)
nRBC: 0 % (ref 0.0–0.2)

## 2018-12-09 LAB — IRON AND TIBC
Iron: 84 ug/dL (ref 45–182)
Saturation Ratios: 24 % (ref 17.9–39.5)
TIBC: 353 ug/dL (ref 250–450)
UIBC: 269 ug/dL

## 2018-12-09 LAB — PSA: Prostatic Specific Antigen: 0 ng/mL (ref 0.00–4.00)

## 2018-12-09 LAB — FERRITIN: Ferritin: 52 ng/mL (ref 24–336)

## 2018-12-10 NOTE — Progress Notes (Signed)
Hematology/Oncology  [facility] [phone] [phone]   Patient Care Team: Deland Pretty, MD as PCP - General (Internal Medicine) Stanford Breed Denice Bors, MD as PCP - Cardiology (Cardiology) Deland Pretty, MD (Internal Medicine)  REFERRING PROVIDER: Dr.Chrystal CHIEF COMPLAINTS/REASON FOR VISIT:  Follow up for prostate cancer and anemia  HISTORY OF PRESENTING ILLNESS:  Evan Dorsey is a  85 y.o.  male with PMH listed below who was referred to me for evaluation of anemia Patient was seen by radiation oncology Dr. Baruch Gouty for management of prostate cancer. 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  He was noted to have rising PSA in 2015 up to 10.9, he underwent transrectal ultrasound-guided biopsy showing a 7 of 12 cores positive for Gleason 7 (4+3)adenocarcinoma. Patient decided on active surveillance.  PSA has been rising most recently in August 2019 which was 17.2.  This prompted another trans-rectal ultrasound-guided biopsy in September 2019 and at this time with 10 out of 12 cores positive for Gleason 8 (4+4). Patient is on Eliquis for anticoagulation for atrial fibrillation had significant bleeding from prostate biopsy.  Bone scan on 03/25/2018 showed negative for osseous metastasis. 03/25/2018 CT abdomen pelvis showed no findings highly suspicious for metastatic disease in the abdomen or pelvis.  Scattered small low-attenuation liver mass are indeterminate in density and appear stable since Oct 19, 2016 CT.  More likely benign.  Mild to moderate prostato megaly Patient has been seen by medical oncology in [place] with recommendation of external beam radiation therapy.  Patient finished radiation.  Long term ADT Dr.Ottelin per Dr.Manning's note.   He noticed that patient's hemoglobin progressively worsened.  He was referred to hematology for further  management. Reviewed patient's recent labs on 06/16/2018 revealed anemia with hemoglobin of 7.4 with MCV of 76. Reviewed patient's previous labs ordered by primary care physician's office, anemia is acute onset, duration is since September 2019 when he had excessive bleeding after prostate biopsy.   INTERVAL HISTORY Evan Dorsey is a 85 y.o. male who has above history reviewed by me today presents for follow up of iron deficiency anemia.  Previous received IV venofer for Iron deficiency anemia.  He reports feeling well today. No new complaints.  Denies hematochezia, hematuria, hematemesis, epistaxis, black tarry stool or easy bruising.  On Eliquis for chronic anticoagulation for atrial fibrillation.   Review of Systems  Constitutional: Negative for appetite change, chills, fatigue, fever and unexpected weight change.  HENT:   Negative for hearing loss and voice change.   Eyes: Negative for eye problems and icterus.  Respiratory: Negative for chest tightness, cough and shortness of breath.   Cardiovascular: Negative for chest pain and leg swelling.  Gastrointestinal: Negative for abdominal distention and abdominal pain.  Endocrine: Negative for hot flashes.  Genitourinary: Negative for difficulty urinating, dysuria and frequency.   Musculoskeletal: Negative for arthralgias.  Skin: Negative for itching and rash.  Neurological: Negative for light-headedness and numbness.  Hematological: Negative for adenopathy. Does not bruise/bleed easily.  Psychiatric/Behavioral: Negative for confusion.   MEDICAL HISTORY:  Past Medical History:  Diagnosis Date  . Adenomatous polyp   . Arthritis    "left shoulder" (03/02/2013)  . Atrial fibrillation ([place])   . Bladder cancer ([place]) 07/31/2011  . CAD (coronary artery disease)    a. remote CABG b. prior cardiac cath 2005 c. Lexiscan Myoview 07/08/12 w/o evidence of ischemia, EF 53%  . Diverticulosis   .  Elevated PSA   . Frequent urination    "day  and night" (03/02/2013)  . GERD (gastroesophageal reflux disease)   . Gout    "been a long time ago" (03/02/2013)  . History of kidney stones   . HLD (hyperlipidemia)   . HTN (hypertension)   . Iron deficiency anemia due to chronic blood loss 06/29/2018  . Myocardial infarction ([place]) 1990's   silent  . Prostate cancer [facility])     SURGICAL HISTORY: Past Surgical History:  Procedure Laterality Date  . CARDIAC CATHETERIZATION     total occlusion to LAD, LCX, RCA. LIMA patent, normal seq SVG to OM, severe stenosis in SVG to PDA, but collaterals to PLA from OM. RCA went to PDA. treated medically   . CARDIOVASCULAR STRESS TEST  02/10/08   6 mets, poor tolerance, no chest pain   . CATARACT EXTRACTION W/ INTRAOCULAR LENS IMPLANT Right 2014  . CORONARY ARTERY BYPASS GRAFT  08/1991   "CABG X4" (03/02/2013)  . CYSTOSCOPY W/ STONE MANIPULATION    . ORIF ACETABULAR FRACTURE Left 04/2007   trimalleolar fracture  . TOTAL SHOULDER ARTHROPLASTY Left 03/02/2013  . TOTAL SHOULDER ARTHROPLASTY Left 03/02/2013   Procedure: LEFT TOTAL SHOULDER ARTHROPLASTY;  Surgeon: Marin Shutter, MD;  Location: [place];  Service: Orthopedics;  Laterality: Left;  . TRANSURETHRAL RESECTION OF BLADDER TUMOR  07/31/2011   Procedure: TRANSURETHRAL RESECTION OF BLADDER TUMOR (TURBT);  Surgeon: Claybon Jabs, MD;  Location: [facility];  Service: Urology;  Laterality: N/A;  with Gyrus  . URETEROSCOPY WITH HOLMIUM LASER LITHOTRIPSY Right 11/06/2016   Procedure: URETEROSCOPY WITH HOLMIUM LASER LITHOTRIPSY/ RETROGRADE PYELOGRAM/ STENT;  Surgeon: Kathie Rhodes, MD;  Location: [facility];  Service: Urology;  Laterality: Right;    SOCIAL HISTORY: Social History   Socioeconomic History  . Marital status: Married    Spouse name: Not on file  . Number of children: 3  . Years of education: Not on file  . Highest education level: Not on file  Occupational History  . Occupation: retired Psychologist, sport and exercise  Social Needs  . Financial  resource strain: Not on file  . Food insecurity    Worry: Not on file    Inability: Not on file  . Transportation needs    Medical: Not on file    Non-medical: Not on file  Tobacco Use  . Smoking status: Never Smoker  . Smokeless tobacco: Never Used  Substance and Sexual Activity  . Alcohol use: No  . Drug use: No  . Sexual activity: Not Currently  Lifestyle  . Physical activity    Days per week: Not on file    Minutes per session: Not on file  . Stress: Not on file  Relationships  . Social Herbalist on phone: Not on file    Gets together: Not on file    Attends religious service: Not on file    Active member of club or organization: Not on file    Attends meetings of clubs or organizations: Not on file    Relationship status: Not on file  . Intimate partner violence    Fear of current or ex partner: Not on file    Emotionally abused: Not on file    Physically abused: Not on file    Forced sexual activity: Not on file  Other Topics Concern  . Not on file  Social History Narrative   Lives at home with wife. Retired Psychologist, sport and exercise.     FAMILY  HISTORY: Family History  Problem Relation Age of Onset  . CAD Father 69  . Prostate cancer Brother   . CAD Brother 5  . CAD Brother 74  . Breast cancer Neg Hx   . Colon cancer Neg Hx   . Pancreatic cancer Neg Hx     ALLERGIES:  has No Known Allergies.  MEDICATIONS:  Current Outpatient Medications  Medication Sig Dispense Refill  . acetaminophen (TYLENOL) 500 MG tablet Take 500 mg by mouth every 6 (six) hours as needed.    Marland Kitchen amiodarone (PACERONE) 200 MG tablet TAKE ONE TABLET BY MOUTH DAILY 30 tablet 6  . amLODipine (NORVASC) 5 MG tablet TAKE ONE TABLET BY MOUTH EVERY DAY 90 tablet 3  . apixaban (ELIQUIS) 5 MG TABS tablet Take 1 tablet (5 mg total) by mouth 2 (two) times daily. 56 tablet 0  . calcium carbonate (TUMS - DOSED IN MG ELEMENTAL CALCIUM) 500 MG chewable tablet Chew 2 tablets by mouth daily.    .  cholecalciferol (VITAMIN D) 1000 units tablet Take 1,000 Units by mouth daily.    Marland Kitchen docusate sodium (COLACE) 100 MG capsule Take 1 capsule (100 mg total) by mouth daily. Do not take if you have loose stools 30 capsule 3  . ferrous sulfate 325 (65 FE) MG EC tablet Take 1 tablet (325 mg total) by mouth daily. 30 tablet 3  . furosemide (LASIX) 20 MG tablet Take 1 tablet (20 mg total) by mouth daily. 30 tablet 0  . MYRBETRIQ 50 MG TB24 tablet Take 50 mg by mouth daily.  11  . nitroGLYCERIN (NITROSTAT) 0.4 MG SL tablet PLACE 1 TABLET UNDER THE TONGUE EVERY 5 MINUTES IF NEEDED FOR CHEST PAIN. MAX = 3 TABS/EPISODE. 25 tablet 4  . potassium chloride SA (K-DUR,KLOR-CON) 20 MEQ tablet Take 1 tablet (20 mEq total) by mouth daily. 90 tablet 3  . psyllium (HYDROCIL/METAMUCIL) 95 % PACK Take 1 packet by mouth daily. 240 each 0  . rosuvastatin (CRESTOR) 40 MG tablet Take 40 mg by mouth daily.  3   No current facility-administered medications for this visit.      PHYSICAL EXAMINATION: ECOG PERFORMANCE STATUS: 1 - Symptomatic but completely ambulatory Vitals:   12/09/18 1403  BP: (!) 143/76  Pulse: (!) 59  Resp: 18  Temp: 98.3 F (36.8 C)   Filed Weights   12/09/18 1403  Weight: 222 lb 12.8 oz (101.1 kg)    Physical Exam Constitutional:      General: He is not in acute distress. HENT:     Head: Normocephalic and atraumatic.  Eyes:     General: No scleral icterus.    Pupils: Pupils are equal, round, and reactive to light.  Neck:     Musculoskeletal: Normal range of motion and neck supple.  Cardiovascular:     Rate and Rhythm: Normal rate and regular rhythm.     Heart sounds: Normal heart sounds.  Pulmonary:     Effort: Pulmonary effort is normal. No respiratory distress.     Breath sounds: No wheezing.  Abdominal:     General: Bowel sounds are normal. There is no distension.     Palpations: Abdomen is soft. There is no mass.     Tenderness: There is no abdominal tenderness.   Musculoskeletal: Normal range of motion.        General: No deformity.     Comments: Bilateral lower extremity trace edema.   Skin:    General: Skin is warm and dry.  Coloration: Skin is not pale.     Findings: No erythema or rash.  Neurological:     Mental Status: He is alert and oriented to person, place, and time.     Cranial Nerves: No cranial nerve deficit.     Coordination: Coordination normal.  Psychiatric:        Behavior: Behavior normal.        Thought Content: Thought content normal.      LABORATORY DATA:  I have reviewed the data as listed Lab Results  Component Value Date   WBC 12.2 (H) 12/09/2018   HGB 15.5 12/09/2018   HCT 47.5 12/09/2018   MCV 96.2 12/09/2018   PLT 223 12/09/2018   Recent Labs    06/28/18 1445 07/12/18 1456 07/21/18 1428 08/05/18 1211 11/08/18 1132  NA 143 143 146* 145* 143  K 3.6 3.5 3.6 4.2 4.2  CL 109 109 105 105 102  CO2 26 26 25 23 24   GLUCOSE 114* 105* 94 79 99  BUN 19 14 11 19 14   CREATININE 1.23 1.19 1.05 1.16 1.26  CALCIUM 9.4 9.3 9.4 10.0 9.8  GFRNONAA 54* 56* 65 58* 52*  GFRAA >60 >60 76 67 60  PROT 6.5 6.7  --   --  6.6  ALBUMIN 3.6 3.6  --   --  4.0  AST 28 29  --   --  65*  ALT 23 23  --   --  55*  ALKPHOS 53 54  --   --  88  BILITOT 0.6 0.7  --   --  0.4  BILIDIR  --   --   --   --  0.16   Iron/TIBC/Ferritin/ %Sat    Component Value Date/Time   IRON 84 12/09/2018 1343   TIBC 353 12/09/2018 1343   FERRITIN 52 12/09/2018 1343   IRONPCTSAT 24 12/09/2018 1343     RADIOGRAPHIC STUDIES: I have personally reviewed the radiological images as listed and agreed with the findings in the report. 05/17/2018 CT angio chest IMPRESSION: 1. No overt aneurysmal disease of the ascending thoracic aorta by CTA measurement. The ascending aorta is top normal in caliber and measures 3.9 cm in greatest diameter. 2. Normal variant separate origin of the left vertebral artery off of the aortic arch. Bone scan on 03/25/2018  showed negative for osseous metastasis. 03/25/2018 CT abdomen pelvis showed no findings highly suspicious for metastatic disease in the abdomen or pelvis.  Scattered small low-attenuation liver mass are indeterminate in density and appear stable since Oct 19, 2016 CT.  More likely benign.  Mild to moderate prostato megaly   ASSESSMENT & PLAN:  1. Iron deficiency anemia due to chronic blood loss   2. Prostate cancer [facility])    #Labs are reviewed and discussed with patient. Iron deficiency has resolved. Clinically doing well. No need for additional oral or IV iron supplementation at this point.  #Prostate cancer status post radiation.follows up with Rad Onc. He has started Leupron Injections. He followsup with Dr.Ottelin Urology in [place] for ADT.    All questions were answered. The patient knows to call the clinic with any problems questions or concerns.  Return of visit:  6 months.   Earlie Server, MD, PhD

## 2018-12-13 NOTE — Telephone Encounter (Signed)
Opened in error

## 2018-12-14 ENCOUNTER — Other Ambulatory Visit: Payer: Self-pay

## 2018-12-15 ENCOUNTER — Other Ambulatory Visit: Payer: Self-pay

## 2018-12-15 ENCOUNTER — Ambulatory Visit
Admission: RE | Admit: 2018-12-15 | Discharge: 2018-12-15 | Disposition: A | Discharge status: Home / Self Care | Payer: Medicare Other | Source: Ambulatory Visit | Attending: Radiation Oncology | Admitting: Radiation Oncology

## 2018-12-15 ENCOUNTER — Encounter: Payer: Self-pay | Admitting: Radiation Oncology

## 2018-12-15 VITALS — BP 139/72 | HR 58 | Temp 96.7°F | Resp 18 | Wt 223.8 lb

## 2018-12-15 DIAGNOSIS — C61 Malignant neoplasm of prostate: Secondary | ICD-10-CM

## 2018-12-15 DIAGNOSIS — Z923 Personal history of irradiation: Secondary | ICD-10-CM | POA: Insufficient documentation

## 2018-12-15 NOTE — Progress Notes (Signed)
Radiation Oncology Follow up Note  Name: Evan Dorsey   Date:   12/15/2018 MRN:  [id number] DOB: [birth year]    This 85 y.o. male presents to the clinic today for 69-month follow-up status post IMRT radiation therapy for stage IIb adenocarcinoma the prostate.  REFERRING PROVIDER: Deland Pretty, MD  HPI: Patient is an 85 year old male now out 4 months having completed IMRT radiation therapy to his prostate and pelvic nodes for Gleason 8 (4+4) adenocarcinoma the prostate presenting with a PSA of 17.2.  He is seen today in routine follow-up is doing well.  He specifically denies any increased lower urinary tract symptoms diarrhea or fatigue.  His most recent PSA is 0..  COMPLICATIONS OF TREATMENT: none  FOLLOW UP COMPLIANCE: keeps appointments   PHYSICAL EXAM:  BP 139/72   Pulse (!) 58   Temp (!) 96.7 F (35.9 C)   Resp 18   Wt 223 lb 12.3 oz (101.5 kg)   BMI 31.21 kg/m  Well-developed well-nourished patient in NAD. HEENT reveals PERLA, EOMI, discs not visualized.  Oral cavity is clear. No oral mucosal lesions are identified. Neck is clear without evidence of cervical or supraclavicular adenopathy. Lungs are clear to A&P. Cardiac examination is essentially unremarkable with regular rate and rhythm without murmur rub or thrill. Abdomen is benign with no organomegaly or masses noted. Motor sensory and DTR levels are equal and symmetric in the upper and lower extremities. Cranial nerves II through XII are grossly intact. Proprioception is intact. No peripheral adenopathy or edema is identified. No motor or sensory levels are noted. Crude visual fields are within normal range.  RADIOLOGY RESULTS: No current films for review  PLAN: Present time patient is doing well with no evidence of disease excellent biochemical response to therapy.  He continues on androgen deprivation therapy through urology.  I have asked to see him back in 6 months for follow-up and then will start once a year follow-up  visits.  Patient knows to call with any concerns.  I would like to take this opportunity to thank you for allowing me to participate in the care of your patient.Noreene Filbert, MD

## 2018-12-28 DIAGNOSIS — I872 Venous insufficiency (chronic) (peripheral): Secondary | ICD-10-CM | POA: Diagnosis not present

## 2018-12-28 DIAGNOSIS — L820 Inflamed seborrheic keratosis: Secondary | ICD-10-CM | POA: Diagnosis not present

## 2018-12-28 DIAGNOSIS — C4359 Malignant melanoma of other part of trunk: Secondary | ICD-10-CM | POA: Diagnosis not present

## 2018-12-28 DIAGNOSIS — L258 Unspecified contact dermatitis due to other agents: Secondary | ICD-10-CM | POA: Diagnosis not present

## 2018-12-29 ENCOUNTER — Other Ambulatory Visit: Payer: Self-pay | Admitting: Cardiology

## 2019-01-03 ENCOUNTER — Other Ambulatory Visit: Payer: Self-pay | Admitting: Cardiology

## 2019-01-04 DIAGNOSIS — R945 Abnormal results of liver function studies: Secondary | ICD-10-CM | POA: Diagnosis not present

## 2019-01-17 DIAGNOSIS — C4359 Malignant melanoma of other part of trunk: Secondary | ICD-10-CM | POA: Diagnosis not present

## 2019-01-17 DIAGNOSIS — L820 Inflamed seborrheic keratosis: Secondary | ICD-10-CM | POA: Diagnosis not present

## 2019-01-17 DIAGNOSIS — L821 Other seborrheic keratosis: Secondary | ICD-10-CM | POA: Diagnosis not present

## 2019-01-26 ENCOUNTER — Other Ambulatory Visit: Payer: Self-pay | Admitting: Cardiology

## 2019-01-26 NOTE — Telephone Encounter (Signed)
30m 101.1kg Scr 1.26 11/08/18 Lovw/crenshaw 11/03/18

## 2019-02-02 DIAGNOSIS — R748 Abnormal levels of other serum enzymes: Secondary | ICD-10-CM | POA: Diagnosis not present

## 2019-03-31 ENCOUNTER — Other Ambulatory Visit: Payer: Self-pay | Admitting: *Deleted

## 2019-03-31 MED ORDER — DOCUSATE SODIUM 100 MG PO CAPS: 100.0000 mg | ORAL_CAPSULE | Freq: Every day | ORAL | 3 refills | Status: AC

## 2019-04-06 DIAGNOSIS — S29012A Strain of muscle and tendon of back wall of thorax, initial encounter: Secondary | ICD-10-CM | POA: Diagnosis not present

## 2019-04-25 DIAGNOSIS — Z85820 Personal history of malignant melanoma of skin: Secondary | ICD-10-CM | POA: Diagnosis not present

## 2019-04-25 DIAGNOSIS — Z1283 Encounter for screening for malignant neoplasm of skin: Secondary | ICD-10-CM | POA: Diagnosis not present

## 2019-04-25 DIAGNOSIS — C44319 Basal cell carcinoma of skin of other parts of face: Secondary | ICD-10-CM | POA: Diagnosis not present

## 2019-04-25 DIAGNOSIS — D225 Melanocytic nevi of trunk: Secondary | ICD-10-CM | POA: Diagnosis not present

## 2019-04-25 DIAGNOSIS — Z08 Encounter for follow-up examination after completed treatment for malignant neoplasm: Secondary | ICD-10-CM | POA: Diagnosis not present

## 2019-04-27 ENCOUNTER — Other Ambulatory Visit: Payer: Self-pay | Admitting: Cardiology

## 2019-04-27 NOTE — Telephone Encounter (Signed)
Rx request sent to pharmacy.  

## 2019-05-08 DIAGNOSIS — C61 Malignant neoplasm of prostate: Secondary | ICD-10-CM | POA: Diagnosis not present

## 2019-05-15 DIAGNOSIS — Z8551 Personal history of malignant neoplasm of bladder: Secondary | ICD-10-CM | POA: Diagnosis not present

## 2019-05-15 DIAGNOSIS — C61 Malignant neoplasm of prostate: Secondary | ICD-10-CM | POA: Diagnosis not present

## 2019-05-17 NOTE — Progress Notes (Signed)
HPI: Followup coronary artery disease status post coronary artery bypassing graft and atrial fibrillation. Patient had previous coronary artery bypassing graft in 1993. Last cardiac catheterization in 2005 showed occluded LAD, circumflex and right coronary artery. LIMA to the LAD was patent, saphenous vein graft to obtuse marginal patent. The saphenous vein graft to the PDA had a severe stenosis but there were collaterals from the obtuse marginal and PLA. Medical therapy recommended. Nuclear study7/17 showed EF 59 and no ischemia.  CTA December 2019 showed mildly dilated aortic root at 3.9 cm.  Echocardiogram February 2020 showed normal LV function, moderate diastolic dysfunction, mild biatrial enlargement, mild aortic stenosis with mean gradient 11 mmHg and mildly dilated aortic root.  Patient seen in February with excess volume and was treated with diuretic.  Since he was last seen,the patient denies any dyspnea on exertion, orthopnea, PND, pedal edema, palpitations, syncope or chest pain.   Current Outpatient Medications  Medication Sig Dispense Refill  . acetaminophen (TYLENOL) 500 MG tablet Take 500 mg by mouth every 6 (six) hours as needed.    Marland Kitchen amiodarone (PACERONE) 200 MG tablet TAKE ONE TABLET BY MOUTH DAILY 30 tablet 6  . amLODipine (NORVASC) 5 MG tablet TAKE ONE TABLET BY MOUTH EVERY DAY 90 tablet 3  . calcium carbonate (TUMS - DOSED IN MG ELEMENTAL CALCIUM) 500 MG chewable tablet Chew 2 tablets by mouth daily.    . cholecalciferol (VITAMIN D) 1000 units tablet Take 1,000 Units by mouth daily.    Marland Kitchen docusate sodium (COLACE) 100 MG capsule Take 1 capsule (100 mg total) by mouth daily. Do not take if you have loose stools 30 capsule 3  . ELIQUIS 5 MG TABS tablet TAKE ONE TABLET BY MOUTH TWICE DAILY 180 tablet 1  . ferrous sulfate 325 (65 FE) MG EC tablet Take 1 tablet (325 mg total) by mouth daily. 30 tablet 3  . furosemide (LASIX) 20 MG tablet Take 1 tablet (20 mg total) by mouth  daily. 30 tablet 0  . MYRBETRIQ 50 MG TB24 tablet Take 50 mg by mouth daily.  11  . nitroGLYCERIN (NITROSTAT) 0.4 MG SL tablet PLACE 1 TABLET UNDER THE TONGUE EVERY 5 MINUTES IF NEEDED FOR CHEST PAIN. MAX = 3 TABS/EPISODE. 25 tablet 4  . potassium chloride SA (K-DUR,KLOR-CON) 20 MEQ tablet Take 1 tablet (20 mEq total) by mouth daily. 90 tablet 3  . psyllium (HYDROCIL/METAMUCIL) 95 % PACK Take 1 packet by mouth daily. 240 each 0  . rosuvastatin (CRESTOR) 40 MG tablet Take 40 mg by mouth daily.  3   No current facility-administered medications for this visit.     Past Medical History:  Diagnosis Date  . Adenomatous polyp   . Arthritis    "left shoulder" (03/02/2013)  . Atrial fibrillation ([place])   . Bladder cancer ([place]) 07/31/2011  . CAD (coronary artery disease)    a. remote CABG b. prior cardiac cath 2005 c. Lexiscan Myoview 07/08/12 w/o evidence of ischemia, EF 53%  . Diverticulosis   . Elevated PSA   . Frequent urination    "day and night" (03/02/2013)  . GERD (gastroesophageal reflux disease)   . Gout    "been a long time ago" (03/02/2013)  . History of kidney stones   . HLD (hyperlipidemia)   . HTN (hypertension)   . Iron deficiency anemia due to chronic blood loss 06/29/2018  . Myocardial infarction ([place]) 1990's   silent  . Prostate cancer ([place])  Past Surgical History:  Procedure Laterality Date  . CARDIAC CATHETERIZATION     total occlusion to LAD, LCX, RCA. LIMA patent, normal seq SVG to OM, severe stenosis in SVG to PDA, but collaterals to PLA from OM. RCA went to PDA. treated medically   . CARDIOVASCULAR STRESS TEST  02/10/08   6 mets, poor tolerance, no chest pain   . CATARACT EXTRACTION W/ INTRAOCULAR LENS IMPLANT Right 2014  . CORONARY ARTERY BYPASS GRAFT  08/1991   "CABG X4" (03/02/2013)  . CYSTOSCOPY W/ STONE MANIPULATION    . ORIF ACETABULAR FRACTURE Left 04/2007   trimalleolar fracture  . TOTAL SHOULDER ARTHROPLASTY Left 03/02/2013  . TOTAL SHOULDER  ARTHROPLASTY Left 03/02/2013   Procedure: LEFT TOTAL SHOULDER ARTHROPLASTY;  Surgeon: Marin Shutter, MD;  Location: [place];  Service: Orthopedics;  Laterality: Left;  . TRANSURETHRAL RESECTION OF BLADDER TUMOR  07/31/2011   Procedure: TRANSURETHRAL RESECTION OF BLADDER TUMOR (TURBT);  Surgeon: Claybon Jabs, MD;  [facility];  Service: Urology;  Laterality: N/A;  with [facility]yrus  . URETEROSCOPY WITH HOLMIUM LASER LITHOTRIPSY Right 11/06/2016   Procedure: URETEROSCOPY WITH HOLMIUM LASER LITHOTRIPSY/ RETROGRADE PYELOGRAM/ STENT;  Surgeon: Kathie Rhodes, MD;  Location: [facility];  Service: Urology;  Laterality: Right;    Social History   Socioeconomic History  . Marital status: Married    Spouse name: Not on file  . Number of children: 3  . Years of education: Not on file  . Highest education level: Not on file  Occupational History  . Occupation: retired Psychologist, sport and exercise  Tobacco Use  . Smoking status: Never Smoker  . Smokeless tobacco: Never Used  Substance and Sexual Activity  . Alcohol use: No  . Drug use: No  . Sexual activity: Not Currently  Other Topics Concern  . Not on file  Social History Narrative   Lives at home with wife. Retired Psychologist, sport and exercise.    Social Determinants of Health   Financial Resource Strain:   . Difficulty of Paying Living Expenses: Not on file  Food Insecurity:   . Worried About Charity fundraiser in the Last Year: Not on file  . Ran Out of Food in the Last Year: Not on file  Transportation Needs:   . Lack of Transportation (Medical): Not on file  . Lack of Transportation (Non-Medical): Not on file  Physical Activity:   . Days of Exercise per Week: Not on file  . Minutes of Exercise per Session: Not on file  Stress:   . Feeling of Stress : Not on file  Social Connections:   . Frequency of Communication with Friends and Family: Not on file  . Frequency of Social Gatherings with Friends and Family: Not on file  . Attends Religious Services: Not on  file  . Active Member of Clubs or Organizations: Not on file  . Attends Archivist Meetings: Not on file  . Marital Status: Not on file  Intimate Partner Violence:   . Fear of Current or Ex-Partner: Not on file  . Emotionally Abused: Not on file  . Physically Abused: Not on file  . Sexually Abused: Not on file    Family History  Problem Relation Age of Onset  . CAD Father 53  . Prostate cancer Brother   . CAD Brother 24  . CAD Brother 53  . Breast cancer Neg Hx   . Colon cancer Neg Hx   . Pancreatic cancer Neg Hx     ROS:  no fevers or chills, productive cough, hemoptysis, dysphasia, odynophagia, melena, hematochezia, dysuria, hematuria, rash, seizure activity, orthopnea, PND, pedal edema, claudication. Remaining systems are negative.  Physical Exam: Well-developed well-nourished in no acute distress.  Skin is warm and dry.  HEENT is normal.  Neck is supple.  Chest is clear to auscultation with normal expansion.  Cardiovascular exam is regular rate and rhythm.  2/6 systolic murmur left sternal border. Abdominal exam nontender or distended. No masses palpated. Extremities show no edema. neuro grossly intact  ECG-sinus rhythm at a rate of 63 with atrial bigeminy, right bundle branch block, prolonged QT interval.  Personally reviewed  A/P  1 coronary artery disease status post coronary artery bypass and graft-patient denies recurrent chest pain.  Continue statin.  He is not on aspirin given need for apixaban.  2 paroxysmal atrial fibrillation-patient has had no recurrences.  Continue amiodarone at present dose.  Check TSH, liver functions and chest x-ray.  Continue apixaban.  Check hemoglobin and renal function.  3 hypertension-blood pressure controlled.  Continue present medications and follow.  4 hyperlipidemia-continue statin.  Check lipids.  5 history of mild aortic stenosis-he will need follow-up echoes in the future.  6 chronic diastolic congestive heart  failure-patient appears to be euvolemic on examination today.  He is now off of diuretic.  We discussed low-sodium diet.  7 history of thoracic aortic aneurysm-aorta at the upper limits of normal on most recent CTA.  [name], MD

## 2019-05-26 ENCOUNTER — Other Ambulatory Visit: Payer: Self-pay

## 2019-05-26 ENCOUNTER — Ambulatory Visit (INDEPENDENT_AMBULATORY_CARE_PROVIDER_SITE_OTHER): Payer: Medicare Other | Admitting: Cardiology

## 2019-05-26 ENCOUNTER — Encounter (INDEPENDENT_AMBULATORY_CARE_PROVIDER_SITE_OTHER): Payer: Self-pay

## 2019-05-26 ENCOUNTER — Encounter: Payer: Self-pay | Admitting: Cardiology

## 2019-05-26 VITALS — BP 120/60 | HR 63 | Ht 71.0 in | Wt 229.3 lb

## 2019-05-26 DIAGNOSIS — I25810 Atherosclerosis of coronary artery bypass graft(s) without angina pectoris: Secondary | ICD-10-CM

## 2019-05-26 DIAGNOSIS — I2510 Atherosclerotic heart disease of native coronary artery without angina pectoris: Secondary | ICD-10-CM

## 2019-05-26 DIAGNOSIS — E7800 Pure hypercholesterolemia, unspecified: Secondary | ICD-10-CM

## 2019-05-26 DIAGNOSIS — I10 Essential (primary) hypertension: Secondary | ICD-10-CM

## 2019-05-26 DIAGNOSIS — I480 Paroxysmal atrial fibrillation: Secondary | ICD-10-CM | POA: Diagnosis not present

## 2019-05-26 LAB — COMPREHENSIVE METABOLIC PANEL
ALT: 45 IU/L — ABNORMAL HIGH (ref 0–44)
AST: 59 IU/L — ABNORMAL HIGH (ref 0–40)
Albumin/Globulin Ratio: 1.6 (ref 1.2–2.2)
Albumin: 4.1 g/dL (ref 3.6–4.6)
Alkaline Phosphatase: 91 IU/L (ref 39–117)
BUN/Creatinine Ratio: 12 (ref 10–24)
BUN: 14 mg/dL (ref 8–27)
Bilirubin Total: 0.4 mg/dL (ref 0.0–1.2)
CO2: 22 mmol/L (ref 20–29)
Calcium: 10 mg/dL (ref 8.6–10.2)
Chloride: 107 mmol/L — ABNORMAL HIGH (ref 96–106)
Creatinine, Ser: 1.21 mg/dL (ref 0.76–1.27)
GFR calc Af Amer: 63 mL/min/{1.73_m2} (ref >59)
GFR calc non Af Amer: 55 mL/min/{1.73_m2} — ABNORMAL LOW (ref >59)
Globulin, Total: 2.6 g/dL (ref 1.5–4.5)
Glucose: 92 mg/dL (ref 65–99)
Potassium: 3.8 mmol/L (ref 3.5–5.2)
Sodium: 144 mmol/L (ref 134–144)
Total Protein: 6.7 g/dL (ref 6.0–8.5)

## 2019-05-26 LAB — CBC
Hematocrit: 39.8 % (ref 37.5–51.0)
Hemoglobin: 13.2 g/dL (ref 13.0–17.7)
MCH: 28.8 pg (ref 26.6–33.0)
MCHC: 33.2 g/dL (ref 31.5–35.7)
MCV: 87 fL (ref 79–97)
Platelets: 293 10*3/uL (ref 150–450)
RBC: 4.59 x10E6/uL (ref 4.14–5.80)
RDW: 13.5 % (ref 11.6–15.4)
WBC: 7.5 10*3/uL (ref 3.4–10.8)

## 2019-05-26 LAB — TSH: TSH: 3.47 u[IU]/mL (ref 0.450–4.500)

## 2019-05-26 LAB — LIPID PANEL
Chol/HDL Ratio: 2 ratio (ref 0.0–5.0)
Cholesterol, Total: 123 mg/dL (ref 100–199)
HDL: 61 mg/dL (ref >39)
LDL Chol Calc (NIH): 44 mg/dL (ref 0–99)
Triglycerides: 100 mg/dL (ref 0–149)
VLDL Cholesterol Cal: 18 mg/dL (ref 5–40)

## 2019-05-26 NOTE — Patient Instructions (Signed)
Medication Instructions:  NO CHANGE *If you need a refill on your cardiac medications before your next appointment, please call your pharmacy*  Lab Work: Your physician recommends that you HAVE LAB WORK TODAY  If you have labs (blood work) drawn today and your tests are completely normal, you will receive your results only by: Marland Kitchen MyChart Message (if you have MyChart) OR . A paper copy in the mail If you have any lab test that is abnormal or we need to change your treatment, we will call you to review the results.  Testing/Procedures: A chest x-ray takes a picture of the organs and structures inside the chest, including the heart, lungs, and blood vessels. This test can show several things, including, whether the heart is enlarges; whether fluid is building up in the lungs; and whether pacemaker / defibrillator leads are still in place. [place]  Follow-Up: At [facility], you and your health needs are our priority.  As part of our continuing mission to provide you with exceptional heart care, we have created designated Provider Care Teams.  These Care Teams include your primary Cardiologist (physician) and Advanced Practice Providers (APPs -  Physician Assistants and Nurse Practitioners) who all work together to provide you with the care you need, when you need it.  Your next appointment:   6 month(s)  The format for your next appointment:   Either In Person or Virtual  Provider:   You may see [name], MD or one of the following Advanced Practice Providers on your designated Care Team:    Kerin Ransom, PA-C  [place], [place]  Coletta Memos, [place]

## 2019-05-29 ENCOUNTER — Encounter: Payer: Self-pay | Admitting: *Deleted

## 2019-05-30 ENCOUNTER — Ambulatory Visit
Admission: RE | Admit: 2019-05-30 | Discharge: 2019-05-30 | Disposition: A | Discharge status: Home / Self Care | Payer: Medicare Other | Source: Ambulatory Visit | Attending: Cardiology | Admitting: Cardiology

## 2019-05-30 DIAGNOSIS — I480 Paroxysmal atrial fibrillation: Secondary | ICD-10-CM

## 2019-05-30 DIAGNOSIS — C44319 Basal cell carcinoma of skin of other parts of face: Secondary | ICD-10-CM | POA: Diagnosis not present

## 2019-05-31 ENCOUNTER — Encounter: Payer: Self-pay | Admitting: *Deleted

## 2019-06-12 ENCOUNTER — Inpatient Hospital Stay (HOSPITAL_BASED_OUTPATIENT_CLINIC_OR_DEPARTMENT_OTHER): Payer: Medicare Other | Admitting: Oncology

## 2019-06-12 ENCOUNTER — Encounter: Payer: Self-pay | Admitting: Oncology

## 2019-06-12 ENCOUNTER — Other Ambulatory Visit: Payer: Self-pay

## 2019-06-12 ENCOUNTER — Inpatient Hospital Stay: Payer: Medicare Other | Attending: Oncology

## 2019-06-12 VITALS — BP 120/72 | HR 52 | Temp 98.5°F | Resp 18 | Wt 235.2 lb

## 2019-06-12 DIAGNOSIS — I4891 Unspecified atrial fibrillation: Secondary | ICD-10-CM | POA: Insufficient documentation

## 2019-06-12 DIAGNOSIS — C61 Malignant neoplasm of prostate: Secondary | ICD-10-CM | POA: Diagnosis present

## 2019-06-12 DIAGNOSIS — D500 Iron deficiency anemia secondary to blood loss (chronic): Secondary | ICD-10-CM | POA: Diagnosis not present

## 2019-06-12 DIAGNOSIS — Z8551 Personal history of malignant neoplasm of bladder: Secondary | ICD-10-CM | POA: Diagnosis not present

## 2019-06-12 DIAGNOSIS — Z923 Personal history of irradiation: Secondary | ICD-10-CM | POA: Insufficient documentation

## 2019-06-12 DIAGNOSIS — Z79899 Other long term (current) drug therapy: Secondary | ICD-10-CM | POA: Insufficient documentation

## 2019-06-12 DIAGNOSIS — Z7901 Long term (current) use of anticoagulants: Secondary | ICD-10-CM | POA: Insufficient documentation

## 2019-06-12 LAB — CBC WITH DIFFERENTIAL/PLATELET
Abs Immature Granulocytes: 0.03 10*3/uL (ref 0.00–0.07)
Basophils Absolute: 0 10*3/uL (ref 0.0–0.1)
Basophils Relative: 0 %
Eosinophils Absolute: 0.1 10*3/uL (ref 0.0–0.5)
Eosinophils Relative: 2 %
HCT: 40.5 % (ref 39.0–52.0)
Hemoglobin: 12.1 g/dL — ABNORMAL LOW (ref 13.0–17.0)
Immature Granulocytes: 0 %
Lymphocytes Relative: 14 %
Lymphs Abs: 1 10*3/uL (ref 0.7–4.0)
MCH: 27.2 pg (ref 26.0–34.0)
MCHC: 29.9 g/dL — ABNORMAL LOW (ref 30.0–36.0)
MCV: 91 fL (ref 80.0–100.0)
Monocytes Absolute: 1.2 10*3/uL — ABNORMAL HIGH (ref 0.1–1.0)
Monocytes Relative: 17 %
Neutro Abs: 4.9 10*3/uL (ref 1.7–7.7)
Neutrophils Relative %: 67 %
Platelets: 254 10*3/uL (ref 150–400)
RBC: 4.45 MIL/uL (ref 4.22–5.81)
RDW: 14.6 % (ref 11.5–15.5)
WBC: 7.2 10*3/uL (ref 4.0–10.5)
nRBC: 0 % (ref 0.0–0.2)

## 2019-06-12 LAB — IRON AND TIBC
Iron: 40 ug/dL — ABNORMAL LOW (ref 45–182)
Saturation Ratios: 10 % — ABNORMAL LOW (ref 17.9–39.5)
TIBC: 410 ug/dL (ref 250–450)
UIBC: 370 ug/dL

## 2019-06-12 LAB — FERRITIN: Ferritin: 12 ng/mL — ABNORMAL LOW (ref 24–336)

## 2019-06-12 NOTE — Progress Notes (Signed)
Hematology/Oncology  [facility] Telephone:([phone] [phone]   Patient Care Team: Deland Pretty, MD as PCP - General (Internal Medicine) Stanford Breed Denice Bors, MD as PCP - Cardiology (Cardiology) Deland Pretty, MD (Internal Medicine)  REFERRING PROVIDER: Dr.Chrystal CHIEF COMPLAINTS/REASON FOR VISIT:  Follow up for prostate cancer and anemia  HISTORY OF PRESENTING ILLNESS:  Evan Dorsey is a  85 y.o.  male with PMH listed below who was referred to me for evaluation of anemia Patient was seen by radiation oncology Dr. Baruch Gouty for management of prostate cancer. 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  He was noted to have rising PSA in 2015 up to 10.9, he underwent transrectal ultrasound-guided biopsy showing a 7 of 12 cores positive for Gleason 7 (4+3)adenocarcinoma. Patient decided on active surveillance.  PSA has been rising most recently in August 2019 which was 17.2.  This prompted another trans-rectal ultrasound-guided biopsy in September 2019 and at this time with 10 out of 12 cores positive for Gleason 8 (4+4). Patient is on Eliquis for anticoagulation for atrial fibrillation had significant bleeding from prostate biopsy.  Bone scan on 03/25/2018 showed negative for osseous metastasis. 03/25/2018 CT abdomen pelvis showed no findings highly suspicious for metastatic disease in the abdomen or pelvis.  Scattered small low-attenuation liver mass are indeterminate in density and appear stable since Oct 19, 2016 CT.  More likely benign.  Mild to moderate prostato megaly Patient has been seen by medical oncology in [place] with recommendation of external beam radiation therapy.  Patient finished radiation.  Long term ADT Dr.Ottelin per Dr.Manning's note.   He noticed that patient's hemoglobin progressively worsened.  He was referred to hematology for further  management. Reviewed patient's recent labs on 06/16/2018 revealed anemia with hemoglobin of 7.4 with MCV of 76. Reviewed patient's previous labs ordered by primary care physician's office, anemia is acute onset, duration is since September 2019 when he had excessive bleeding after prostate biopsy.   INTERVAL HISTORY Evan Dorsey is a 85 y.o. male who has above history reviewed by me today presents for follow up of iron deficiency anemia.  Patient has previously received IV iron treatments  Today patient reports feeling fatigued.  Energy level has decreased. Denies hematochezia, hematuria, hematemesis, epistaxis, black tarry stool or easy bruising.  Patient has been on Eliquis 5 mg twice daily for atrial fibrillation.  Review of Systems  Constitutional: Positive for fatigue. Negative for appetite change, chills, fever and unexpected weight change.  HENT:   Positive for hearing loss. Negative for voice change.   Eyes: Negative for eye problems and icterus.  Respiratory: Negative for chest tightness, cough and shortness of breath.   Cardiovascular: Negative for chest pain and leg swelling.  Gastrointestinal: Negative for abdominal distention and abdominal pain.  Endocrine: Negative for hot flashes.  Genitourinary: Negative for difficulty urinating, dysuria and frequency.   Musculoskeletal: Negative for arthralgias.  Skin: Negative for itching and rash.  Neurological: Negative for light-headedness and numbness.  Hematological: Negative for adenopathy. Does not bruise/bleed easily.  Psychiatric/Behavioral: Negative for confusion.   MEDICAL HISTORY:  Past Medical History:  Diagnosis Date  . Adenomatous polyp   . Arthritis    "left shoulder" (03/02/2013)  . Atrial fibrillation ([place])   . Bladder cancer ([place]) 07/31/2011  . CAD (coronary artery disease)    a. remote CABG b. prior cardiac cath 2005 c. Lexiscan Myoview 07/08/12 w/o evidence of ischemia,  EF 53%  . Diverticulosis   .  Elevated PSA   . Frequent urination    "day and night" (03/02/2013)  . GERD (gastroesophageal reflux disease)   . Gout    "been a long time ago" (03/02/2013)  . History of kidney stones   . HLD (hyperlipidemia)   . HTN (hypertension)   . Iron deficiency anemia due to chronic blood loss 06/29/2018  . Myocardial infarction ([place]) 1990's   silent  . Prostate cancer [facility])     SURGICAL HISTORY: Past Surgical History:  Procedure Laterality Date  . CARDIAC CATHETERIZATION     total occlusion to LAD, LCX, RCA. LIMA patent, normal seq SVG to OM, severe stenosis in SVG to PDA, but collaterals to PLA from OM. RCA went to PDA. treated medically   . CARDIOVASCULAR STRESS TEST  02/10/08   6 mets, poor tolerance, no chest pain   . CATARACT EXTRACTION W/ INTRAOCULAR LENS IMPLANT Right 2014  . CORONARY ARTERY BYPASS GRAFT  08/1991   "CABG X4" (03/02/2013)  . CYSTOSCOPY W/ STONE MANIPULATION    . ORIF ACETABULAR FRACTURE Left 04/2007   trimalleolar fracture  . TOTAL SHOULDER ARTHROPLASTY Left 03/02/2013  . TOTAL SHOULDER ARTHROPLASTY Left 03/02/2013   Procedure: LEFT TOTAL SHOULDER ARTHROPLASTY;  Surgeon: Marin Shutter, MD;  Location: [facility];  Service: Orthopedics;  Laterality: Left;  . TRANSURETHRAL RESECTION OF BLADDER TUMOR  07/31/2011   Procedure: TRANSURETHRAL RESECTION OF BLADDER TUMOR (TURBT);  Surgeon: Claybon Jabs, MD;  Location: [facility];  Service: Urology;  Laterality: N/A;  with [place]  . URETEROSCOPY WITH HOLMIUM LASER LITHOTRIPSY Right 11/06/2016   Procedure: URETEROSCOPY WITH HOLMIUM LASER LITHOTRIPSY/ RETROGRADE PYELOGRAM/ STENT;  Surgeon: Kathie Rhodes, MD;  Location: [facility];  Service: Urology;  Laterality: Right;    SOCIAL HISTORY: Social History   Socioeconomic History  . Marital status: Married    Spouse name: Not on file  . Number of children: 3  . Years of education: Not on file  . Highest education level: Not on file  Occupational History  . Occupation:  retired Psychologist, sport and exercise  Tobacco Use  . Smoking status: Never Smoker  . Smokeless tobacco: Never Used  Substance and Sexual Activity  . Alcohol use: No  . Drug use: No  . Sexual activity: Not Currently  Other Topics Concern  . Not on file  Social History Narrative   Lives at home with wife. Retired Psychologist, sport and exercise.    Social Determinants of Health   Financial Resource Strain:   . Difficulty of Paying Living Expenses: Not on file  Food Insecurity:   . Worried About Charity fundraiser in the Last Year: Not on file  . Ran Out of Food in the Last Year: Not on file  Transportation Needs:   . Lack of Transportation (Medical): Not on file  . Lack of Transportation (Non-Medical): Not on file  Physical Activity:   . Days of Exercise per Week: Not on file  . Minutes of Exercise per Session: Not on file  Stress:   . Feeling of Stress : Not on file  Social Connections:   . Frequency of Communication with Friends and Family: Not on file  . Frequency of Social Gatherings with Friends and Family: Not on file  . Attends Religious Services: Not on file  . Active Member of Clubs or Organizations: Not on file  . Attends Archivist Meetings: Not on file  . Marital Status: Not on file  Intimate  Partner Violence:   . Fear of Current or Ex-Partner: Not on file  . Emotionally Abused: Not on file  . Physically Abused: Not on file  . Sexually Abused: Not on file    FAMILY HISTORY: Family History  Problem Relation Age of Onset  . CAD Father 31  . Prostate cancer Brother   . CAD Brother 47  . CAD Brother 73  . Breast cancer Neg Hx   . Colon cancer Neg Hx   . Pancreatic cancer Neg Hx     ALLERGIES:  has No Known Allergies.  MEDICATIONS:  Current Outpatient Medications  Medication Sig Dispense Refill  . acetaminophen (TYLENOL) 500 MG tablet Take 500 mg by mouth every 6 (six) hours as needed.    Marland Kitchen amiodarone (PACERONE) 200 MG tablet TAKE ONE TABLET BY MOUTH DAILY 30 tablet 6  . amLODipine  (NORVASC) 5 MG tablet TAKE ONE TABLET BY MOUTH EVERY DAY 90 tablet 3  . calcium carbonate (TUMS - DOSED IN MG ELEMENTAL CALCIUM) 500 MG chewable tablet Chew 2 tablets by mouth daily.    . cholecalciferol (VITAMIN D) 1000 units tablet Take 1,000 Units by mouth daily.    Marland Kitchen docusate sodium (COLACE) 100 MG capsule Take 1 capsule (100 mg total) by mouth daily. Do not take if you have loose stools 30 capsule 3  . ELIQUIS 5 MG TABS tablet TAKE ONE TABLET BY MOUTH TWICE DAILY 180 tablet 1  . MYRBETRIQ 50 MG TB24 tablet Take 50 mg by mouth daily.  11  . nitroGLYCERIN (NITROSTAT) 0.4 MG SL tablet PLACE 1 TABLET UNDER THE TONGUE EVERY 5 MINUTES IF NEEDED FOR CHEST PAIN. MAX = 3 TABS/EPISODE. 25 tablet 4  . potassium chloride SA (K-DUR,KLOR-CON) 20 MEQ tablet Take 1 tablet (20 mEq total) by mouth daily. 90 tablet 3  . psyllium (HYDROCIL/METAMUCIL) 95 % PACK Take 1 packet by mouth daily. 240 each 0  . rosuvastatin (CRESTOR) 40 MG tablet Take 40 mg by mouth daily.  3  . ferrous sulfate 325 (65 FE) MG EC tablet Take 1 tablet (325 mg total) by mouth daily. (Patient not taking: Reported on 06/12/2019) 30 tablet 3  . furosemide (LASIX) 20 MG tablet Take 1 tablet (20 mg total) by mouth daily. (Patient not taking: Reported on 06/12/2019) 30 tablet 0   No current facility-administered medications for this visit.     PHYSICAL EXAMINATION: ECOG PERFORMANCE STATUS: 1 - Symptomatic but completely ambulatory Vitals:   06/12/19 1332  BP: 120/72  Pulse: (!) 52  Resp: 18  Temp: 98.5 F (36.9 C)   Filed Weights   06/12/19 1332  Weight: 235 lb 3.2 oz (106.7 kg)    Physical Exam Constitutional:      General: He is not in acute distress. HENT:     Head: Normocephalic and atraumatic.  Eyes:     General: No scleral icterus.    Pupils: Pupils are equal, round, and reactive to light.  Cardiovascular:     Rate and Rhythm: Normal rate and regular rhythm.     Heart sounds: Normal heart sounds.  Pulmonary:      Effort: Pulmonary effort is normal. No respiratory distress.     Breath sounds: No wheezing.  Abdominal:     General: Bowel sounds are normal. There is no distension.     Palpations: Abdomen is soft. There is no mass.     Tenderness: There is no abdominal tenderness.  Musculoskeletal:        General: No  deformity. Normal range of motion.     Cervical back: Normal range of motion and neck supple.  Skin:    General: Skin is warm and dry.     Coloration: Skin is not pale.     Findings: No erythema or rash.  Neurological:     Mental Status: He is alert and oriented to person, place, and time.     Cranial Nerves: No cranial nerve deficit.     Coordination: Coordination normal.  Psychiatric:        Mood and Affect: Mood normal.      LABORATORY DATA:  I have reviewed the data as listed Lab Results  Component Value Date   WBC 7.2 06/12/2019   HGB 12.1 (L) 06/12/2019   HCT 40.5 06/12/2019   MCV 91.0 06/12/2019   PLT 254 06/12/2019   Recent Labs    07/12/18 1456 08/05/18 1211 11/08/18 1132 05/26/19 1105  NA 143 145* 143 144  K 3.5 4.2 4.2 3.8  CL 109 105 102 107*  CO2 26 23 24 22   GLUCOSE 105* 79 99 92  BUN 14 19 14 14   CREATININE 1.19 1.16 1.26 1.21  CALCIUM 9.3 10.0 9.8 10.0  GFRNONAA 56* 58* 52* 55*  GFRAA >60 67 60 63  PROT 6.7  --  6.6 6.7  ALBUMIN 3.6  --  4.0 4.1  AST 29  --  65* 59*  ALT 23  --  55* 45*  ALKPHOS 54  --  88 91  BILITOT 0.7  --  0.4 0.4  BILIDIR  --   --  0.16  --    Iron/TIBC/Ferritin/ %Sat    Component Value Date/Time   IRON 40 (L) 06/12/2019 1325   TIBC 410 06/12/2019 1325   FERRITIN 12 (L) 06/12/2019 1325   IRONPCTSAT 10 (L) 06/12/2019 1325     RADIOGRAPHIC STUDIES: I have personally reviewed the radiological images as listed and agreed with the findings in the report. 05/17/2018 CT angio chest IMPRESSION: 1. No overt aneurysmal disease of the ascending thoracic aorta by CTA measurement. The ascending aorta is top normal in  caliber and measures 3.9 cm in greatest diameter. 2. Normal variant separate origin of the left vertebral artery off of the aortic arch. Bone scan on 03/25/2018 showed negative for osseous metastasis. 03/25/2018 CT abdomen pelvis showed no findings highly suspicious for metastatic disease in the abdomen or pelvis.  Scattered small low-attenuation liver mass are indeterminate in density and appear stable since Oct 19, 2016 CT.  More likely benign.  Mild to moderate prostato megaly   ASSESSMENT & PLAN:  1. Iron deficiency anemia due to chronic blood loss   2. Prostate cancer ([place])    # Labs reviewedAnd discussed with patient Patient's hemoglobin has decreased to 12.1. Iron panel results were available after patient's clinic. Patient has a ferritin level of 12, saturation 10.  TIBC 410.  Consistent with iron deficiency anemia. I recommend additional IV Venofer weekly x3 Patient will be called for lab results and arrangement of IV iron treatments. Recommend patient establish care with gastroenterology.  He does not have any bleeding clinically.  Suspect chronic blood loss secondary to anticoagulation.  Patient has history of prostate biopsy sites.  Source of blood loss is unclear.  He also has a history of bladder cancer. Check UA.  #Prostate cancer status post radiation.follows up with Rad Onc.  Patient got androgen deprivation therapy through urologist office..    All questions were answered. The patient knows  to call the clinic with any problems questions or concerns.  Return of visit:  6 months.   Earlie Server, MD, PhD

## 2019-06-12 NOTE — Progress Notes (Signed)
Patient does not offer any problems today.  

## 2019-06-12 NOTE — Addendum Note (Signed)
Addended by: Earlie Server on: 06/12/2019 07:34 PM   Modules accepted: Orders

## 2019-06-12 NOTE — Progress Notes (Signed)
Please let patient know that his iron level is low.  I recommend patient to start IV Venofer weekly x3. Please move his appointment up to 3 months-iron labs at 3 months, follow-up with MD plus IV Venofer. Also please arrange patient to have UA done on one of his IV iron days. Refer to gastroenterology for work-up of IDA

## 2019-06-13 ENCOUNTER — Telehealth: Payer: Self-pay

## 2019-06-13 DIAGNOSIS — C61 Malignant neoplasm of prostate: Secondary | ICD-10-CM

## 2019-06-13 DIAGNOSIS — D500 Iron deficiency anemia secondary to blood loss (chronic): Secondary | ICD-10-CM

## 2019-06-13 NOTE — Telephone Encounter (Signed)
Patient informed and will get scheduling to work on appts and let him know details.  I will put in the GI referral.

## 2019-06-13 NOTE — Telephone Encounter (Signed)
-----   Message from Earlie Server, MD sent at 06/12/2019  7:32 PM EST ----- Please let patient know that his iron level is low.  I recommend patient to start IV Venofer weekly x3. Please move his appointment up to 3 months-iron labs at 3 months, follow-up with MD plus IV Venofer. Also please arrange patient to have UA done on one of his IV iron days. Refer to gastroenterology for work-up of IDA

## 2019-06-20 ENCOUNTER — Other Ambulatory Visit: Payer: Self-pay

## 2019-06-20 DIAGNOSIS — D500 Iron deficiency anemia secondary to blood loss (chronic): Secondary | ICD-10-CM

## 2019-06-21 ENCOUNTER — Inpatient Hospital Stay: Payer: Medicare Other

## 2019-06-21 ENCOUNTER — Inpatient Hospital Stay: Payer: Medicare Other | Attending: Oncology

## 2019-06-21 ENCOUNTER — Other Ambulatory Visit: Payer: Self-pay

## 2019-06-21 VITALS — BP 125/54 | HR 52 | Temp 97.7°F | Resp 16

## 2019-06-21 DIAGNOSIS — D500 Iron deficiency anemia secondary to blood loss (chronic): Secondary | ICD-10-CM

## 2019-06-21 DIAGNOSIS — C61 Malignant neoplasm of prostate: Secondary | ICD-10-CM | POA: Diagnosis not present

## 2019-06-21 LAB — URINALYSIS, COMPLETE (UACMP) WITH MICROSCOPIC
Bacteria, UA: NONE SEEN
Bilirubin Urine: NEGATIVE
Glucose, UA: NEGATIVE mg/dL
Hgb urine dipstick: NEGATIVE
Ketones, ur: NEGATIVE mg/dL
Leukocytes,Ua: NEGATIVE
Nitrite: NEGATIVE
Protein, ur: 100 mg/dL — AB
Specific Gravity, Urine: 1.034 — ABNORMAL HIGH (ref 1.005–1.030)
pH: 5 (ref 5.0–8.0)

## 2019-06-21 LAB — CBC WITH DIFFERENTIAL/PLATELET
Abs Immature Granulocytes: 0.02 10*3/uL (ref 0.00–0.07)
Basophils Absolute: 0 10*3/uL (ref 0.0–0.1)
Basophils Relative: 0 %
Eosinophils Absolute: 0.1 10*3/uL (ref 0.0–0.5)
Eosinophils Relative: 1 %
HCT: 39.4 % (ref 39.0–52.0)
Hemoglobin: 11.7 g/dL — ABNORMAL LOW (ref 13.0–17.0)
Immature Granulocytes: 0 %
Lymphocytes Relative: 12 %
Lymphs Abs: 0.9 10*3/uL (ref 0.7–4.0)
MCH: 27.3 pg (ref 26.0–34.0)
MCHC: 29.7 g/dL — ABNORMAL LOW (ref 30.0–36.0)
MCV: 91.8 fL (ref 80.0–100.0)
Monocytes Absolute: 1.3 10*3/uL — ABNORMAL HIGH (ref 0.1–1.0)
Monocytes Relative: 17 %
Neutro Abs: 5.2 10*3/uL (ref 1.7–7.7)
Neutrophils Relative %: 70 %
Platelets: 259 10*3/uL (ref 150–400)
RBC: 4.29 MIL/uL (ref 4.22–5.81)
RDW: 15 % (ref 11.5–15.5)
WBC: 7.4 10*3/uL (ref 4.0–10.5)
nRBC: 0 % (ref 0.0–0.2)

## 2019-06-21 LAB — IRON AND TIBC
Iron: 53 ug/dL (ref 45–182)
Saturation Ratios: 12 % — ABNORMAL LOW (ref 17.9–39.5)
TIBC: 431 ug/dL (ref 250–450)
UIBC: 378 ug/dL

## 2019-06-21 LAB — FERRITIN: Ferritin: 14 ng/mL — ABNORMAL LOW (ref 24–336)

## 2019-06-21 MED ORDER — SODIUM CHLORIDE 0.9 % IV SOLN
Freq: Once | INTRAVENOUS | Status: AC
  Filled 2019-06-21: qty 250

## 2019-06-21 MED ORDER — IRON SUCROSE 20 MG/ML IV SOLN
200.0000 mg | Freq: Once | INTRAVENOUS | Status: AC
  Administered 2019-06-21: 200 mg via INTRAVENOUS
  Filled 2019-06-21: qty 10

## 2019-06-22 ENCOUNTER — Ambulatory Visit (INDEPENDENT_AMBULATORY_CARE_PROVIDER_SITE_OTHER): Payer: Medicare Other | Admitting: Gastroenterology

## 2019-06-22 ENCOUNTER — Encounter: Payer: Self-pay | Admitting: Gastroenterology

## 2019-06-22 VITALS — BP 121/70 | HR 60 | Temp 98.2°F | Resp 16 | Ht 71.0 in | Wt 236.8 lb

## 2019-06-22 DIAGNOSIS — D500 Iron deficiency anemia secondary to blood loss (chronic): Secondary | ICD-10-CM

## 2019-06-22 NOTE — Progress Notes (Signed)
Cephas Darby, MD [address]  [place]  [place], [place] [address]  Main: [phone]  Fax: [phone]    Gastroenterology Consultation  Referring Provider:     Earlie Server, MD Primary Care Physician:  Deland Pretty, MD Primary Gastroenterologist:  Dr. Cephas Darby Reason for Consultation:   Iron deficiency anemia        HPI:   Evan Dorsey is a 86 y.o. male referred by Dr. Deland Pretty, MD  for consultation & management of iron deficiency anemia.  Patient had history of urothelial cancer diagnosed in 2013, s/p resection, history of A. fib on Eliquis, history of prostate cancer diagnosed in 2015.  He was an active surveillance, underwent another transrectal ultrasound-guided biopsy in September [id number], complicated by severe rectal bleeding in setting of anticoagulation for A. fib resulting in acute blood loss anemia, Nadir hemoglobin 7.1 in 06/2018.  Found to have severe iron deficiency anemia.  Patient is closely followed by Dr. Tasia Catchings and has received parenteral iron therapy and he responded well.  His anemia completely recovered, hemoglobin was 15.5 in 11/2018, since then has been gradually drifting down, lowest 11.7 on 06/21/2019.  His ferritin was 52 in 11/2018, dropped to 14 on 06/21/2019.  He is currently receiving parenteral iron therapy first infusion yesterday, scheduled for tomorrow infusions.  He has also undergone external beam radiation treatment for prostate cancer in 06/2018.  Patient initially did not know why he is referred to GI.  I told him that his iron has been low anemia has recurred and therefore referred to GI.  He does report noticing intermittent dark stools.  He denies constipation, rectal bleeding.  He denies abdominal pain, nausea, weight loss Patient is no longer taking oral iron  NSAIDs: None  Antiplts/Anticoagulants/Anti thrombotics: Eliquis for A. fib  GI Procedures: Reports undergoing colonoscopy several years ago  Past Medical History:  Diagnosis  Date  . Adenomatous polyp   . Arthritis    "left shoulder" (03/02/2013)  . Atrial fibrillation ([place])   . Bladder cancer ([place]) 07/31/2011  . CAD (coronary artery disease)    a. remote CABG b. prior cardiac cath 2005 c. Lexiscan Myoview 07/08/12 w/o evidence of ischemia, EF 53%  . Diverticulosis   . Elevated PSA   . Frequent urination    "day and night" (03/02/2013)  . GERD (gastroesophageal reflux disease)   . Gout    "been a long time ago" (03/02/2013)  . History of kidney stones   . HLD (hyperlipidemia)   . HTN (hypertension)   . Iron deficiency anemia due to chronic blood loss 06/29/2018  . Myocardial infarction ([place]) 1990's   silent  . Prostate cancer [facility])     Past Surgical History:  Procedure Laterality Date  . CARDIAC CATHETERIZATION     total occlusion to LAD, LCX, RCA. LIMA patent, normal seq SVG to OM, severe stenosis in SVG to PDA, but collaterals to PLA from OM. RCA went to PDA. treated medically   . CARDIOVASCULAR STRESS TEST  02/10/08   6 mets, poor tolerance, no chest pain   . CATARACT EXTRACTION W/ INTRAOCULAR LENS IMPLANT Right 2014  . CORONARY ARTERY BYPASS GRAFT  08/1991   "CABG X4" (03/02/2013)  . CYSTOSCOPY W/ STONE MANIPULATION    . ORIF ACETABULAR FRACTURE Left 04/2007   trimalleolar fracture  . TOTAL SHOULDER ARTHROPLASTY Left 03/02/2013  . TOTAL SHOULDER ARTHROPLASTY Left 03/02/2013   Procedure: LEFT TOTAL SHOULDER ARTHROPLASTY;  Surgeon: Marin Shutter, MD;  Location: [facility];  Service: Orthopedics;  Laterality: Left;  . TRANSURETHRAL RESECTION OF BLADDER TUMOR  07/31/2011   Procedure: TRANSURETHRAL RESECTION OF BLADDER TUMOR (TURBT);  Surgeon: Claybon Jabs, MD;  [facility];  Service: Urology;  Laterality: N/A;  with Gyrus  . URETEROSCOPY WITH HOLMIUM LASER LITHOTRIPSY Right 11/06/2016   Procedure: URETEROSCOPY WITH HOLMIUM LASER LITHOTRIPSY/ RETROGRADE PYELOGRAM/ STENT;  Surgeon: Kathie Rhodes, MD;  Location: [facility];  Service: Urology;   Laterality: Right;    Current Outpatient Medications:  .  acetaminophen (TYLENOL) 500 MG tablet, Take 500 mg by mouth every 6 (six) hours as needed., Disp: , Rfl:  .  amiodarone (PACERONE) 200 MG tablet, TAKE ONE TABLET BY MOUTH DAILY, Disp: 30 tablet, Rfl: 6 .  amLODipine (NORVASC) 5 MG tablet, TAKE ONE TABLET BY MOUTH EVERY DAY, Disp: 90 tablet, Rfl: 3 .  calcium carbonate (TUMS - DOSED IN MG ELEMENTAL CALCIUM) 500 MG chewable tablet, Chew 2 tablets by mouth daily., Disp: , Rfl:  .  cholecalciferol (VITAMIN D) 1000 units tablet, Take 1,000 Units by mouth daily., Disp: , Rfl:  .  ELIQUIS 5 MG TABS tablet, TAKE ONE TABLET BY MOUTH TWICE DAILY, Disp: 180 tablet, Rfl: 1 .  MYRBETRIQ 50 MG TB24 tablet, Take 50 mg by mouth daily., Disp: , Rfl: 11 .  nitroGLYCERIN (NITROSTAT) 0.4 MG SL tablet, PLACE 1 TABLET UNDER THE TONGUE EVERY 5 MINUTES IF NEEDED FOR CHEST PAIN. MAX = 3 TABS/EPISODE., Disp: 25 tablet, Rfl: 4 .  psyllium (HYDROCIL/METAMUCIL) 95 % PACK, Take 1 packet by mouth daily., Disp: 240 each, Rfl: 0 .  rosuvastatin (CRESTOR) 40 MG tablet, Take 40 mg by mouth daily., Disp: , Rfl: 3 .  docusate sodium (COLACE) 100 MG capsule, Take 1 capsule (100 mg total) by mouth daily. Do not take if you have loose stools (Patient not taking: Reported on 06/22/2019), Disp: 30 capsule, Rfl: 3 .  ferrous sulfate 325 (65 FE) MG EC tablet, Take 1 tablet (325 mg total) by mouth daily. (Patient not taking: Reported on 06/12/2019), Disp: 30 tablet, Rfl: 3 .  furosemide (LASIX) 20 MG tablet, Take 1 tablet (20 mg total) by mouth daily. (Patient not taking: Reported on 06/12/2019), Disp: 30 tablet, Rfl: 0 .  potassium chloride SA (K-DUR,KLOR-CON) 20 MEQ tablet, Take 1 tablet (20 mEq total) by mouth daily. (Patient not taking: Reported on 06/22/2019), Disp: 90 tablet, Rfl: 3   Family History  Problem Relation Age of Onset  . CAD Father 39  . Prostate cancer Brother   . CAD Brother 17  . CAD Brother 74  . Breast cancer  Neg Hx   . Colon cancer Neg Hx   . Pancreatic cancer Neg Hx      Social History   Tobacco Use  . Smoking status: Never Smoker  . Smokeless tobacco: Never Used  Substance Use Topics  . Alcohol use: No  . Drug use: No    Allergies as of 06/22/2019  . (No Known Allergies)    Review of Systems:    All systems reviewed and negative except where noted in HPI.   Physical Exam:  BP 121/70 (BP Location: Left Arm, Patient Position: Sitting, Cuff Size: Large)   Pulse 60   Temp 98.2 F (36.8 C)   Resp 16   Ht 5\' 11"  (1.803 m)   Wt 236 lb 12.8 oz (107.4 kg)   BMI 33.03 kg/m  No LMP for male patient.  General:  Alert,  Well-developed, well-nourished, pleasant and cooperative in NAD Head:  Normocephalic and atraumatic. Eyes:  Sclera clear, no icterus.   Conjunctiva pink. Ears: Decreased auditory acuity. Lungs:  Respirations even and unlabored.  Clear throughout to auscultation.   No wheezes, crackles, or rhonchi. No acute distress. Heart:  Regular rate and rhythm; no murmurs, clicks, rubs, or gallops. Abdomen:  Normal bowel sounds. Soft, obese, non-tender and non-distended without masses, hepatosplenomegaly or hernias noted.  No guarding or rebound tenderness.   Rectal: Not performed Msk:  Symmetrical without gross deformities. Good, equal movement & strength bilaterally. Pulses:  Normal pulses noted. Extremities:  No clubbing or edema.  No cyanosis. Neurologic:  Alert and oriented x3;  grossly normal neurologically. Skin:  Intact without significant lesions or rashes. No jaundice. Psych:  Alert and cooperative. Normal mood and affect.  Imaging Studies: Reviewed  Assessment and Plan:   EVERICK PADLO is a 86 y.o. Caucasian male with history of coronary disease, s/p CABG, A. fib on Eliquis, metabolic syndrome, history of bladder cancer s/p resection, history of prostate cancer s/p beam radiation therapy in 06/2018.  History of iron deficiency secondary to severe blood loss  after prostate biopsy in 02/2018 which has resolved.  Now, with recurrence of iron deficiency anemia.  Patient reports intermittent dark stools.  Today, I have extensively discussed about endoscopic evaluation including upper endoscopy and colonoscopy given history of dark stools and new onset of iron deficiency anemia.  Differentials include peptic ulcer disease or arteriovenous malformations or bleeding from radiation proctitis or malignancy. However, patient would like to wait until he completes iron infusions and whether or not he responds.  He said he would be willing to undergo further work-up if he does not respond to iron therapy Recommend also to check B12 and folate panel with next set of labs at Dr. Marcelino Scot office  Follow up in 4 to 6 weeks   Cephas Darby, MD

## 2019-06-28 ENCOUNTER — Inpatient Hospital Stay: Payer: Medicare Other

## 2019-06-28 ENCOUNTER — Other Ambulatory Visit: Payer: Self-pay

## 2019-06-28 VITALS — BP 118/68 | HR 55 | Temp 98.2°F | Resp 18

## 2019-06-28 DIAGNOSIS — C61 Malignant neoplasm of prostate: Secondary | ICD-10-CM | POA: Diagnosis not present

## 2019-06-28 DIAGNOSIS — D500 Iron deficiency anemia secondary to blood loss (chronic): Secondary | ICD-10-CM

## 2019-06-28 MED ORDER — IRON SUCROSE 20 MG/ML IV SOLN
200.0000 mg | Freq: Once | INTRAVENOUS | Status: AC
  Administered 2019-06-28: 14:00:00 200 mg via INTRAVENOUS
  Filled 2019-06-28: qty 10

## 2019-06-28 MED ORDER — SODIUM CHLORIDE 0.9 % IV SOLN
Freq: Once | INTRAVENOUS | Status: AC
  Filled 2019-06-28: qty 250

## 2019-06-29 ENCOUNTER — Ambulatory Visit: Payer: Medicare Other | Admitting: Radiation Oncology

## 2019-07-03 ENCOUNTER — Other Ambulatory Visit: Payer: Self-pay | Admitting: Oncology

## 2019-07-03 DIAGNOSIS — D649 Anemia, unspecified: Secondary | ICD-10-CM

## 2019-07-04 ENCOUNTER — Other Ambulatory Visit: Payer: Self-pay

## 2019-07-05 ENCOUNTER — Other Ambulatory Visit: Payer: Self-pay

## 2019-07-05 ENCOUNTER — Encounter: Payer: Self-pay | Admitting: Radiation Oncology

## 2019-07-05 ENCOUNTER — Inpatient Hospital Stay: Payer: Medicare Other

## 2019-07-05 VITALS — BP 125/72 | HR 56 | Temp 97.2°F | Resp 18

## 2019-07-05 DIAGNOSIS — D500 Iron deficiency anemia secondary to blood loss (chronic): Secondary | ICD-10-CM | POA: Diagnosis not present

## 2019-07-05 DIAGNOSIS — C61 Malignant neoplasm of prostate: Secondary | ICD-10-CM | POA: Diagnosis not present

## 2019-07-05 MED ORDER — SODIUM CHLORIDE 0.9 % IV SOLN
Freq: Once | INTRAVENOUS | Status: AC
  Filled 2019-07-05: qty 250

## 2019-07-05 MED ORDER — IRON SUCROSE 20 MG/ML IV SOLN
200.0000 mg | Freq: Once | INTRAVENOUS | Status: AC
  Administered 2019-07-05: 14:00:00 200 mg via INTRAVENOUS
  Filled 2019-07-05: qty 10

## 2019-07-06 ENCOUNTER — Other Ambulatory Visit: Payer: Self-pay

## 2019-07-06 ENCOUNTER — Ambulatory Visit
Admission: RE | Admit: 2019-07-06 | Discharge: 2019-07-06 | Disposition: A | Discharge status: Home / Self Care | Payer: Medicare Other | Source: Ambulatory Visit | Attending: Radiation Oncology | Admitting: Radiation Oncology

## 2019-07-06 ENCOUNTER — Other Ambulatory Visit: Payer: Self-pay | Admitting: *Deleted

## 2019-07-06 VITALS — BP 126/61 | HR 57 | Temp 98.2°F | Resp 18 | Wt 234.7 lb

## 2019-07-06 DIAGNOSIS — C61 Malignant neoplasm of prostate: Secondary | ICD-10-CM | POA: Insufficient documentation

## 2019-07-06 DIAGNOSIS — Z923 Personal history of irradiation: Secondary | ICD-10-CM | POA: Diagnosis not present

## 2019-07-06 NOTE — Progress Notes (Signed)
Radiation Oncology Follow up Note  Name: Evan Dorsey   Date:   07/06/2019 MRN:  [id number] DOB: 1932/11/08    This 86 y.o. male presents to the clinic today for 34-month follow-up status post IMRT radiation therapy for stage IIb adenocarcinoma the prostate.  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  He also received radiation to his pelvic nodes.  He is seen today in routine follow-up is doing well he states he is having no significant increased lower urinary tract symptoms diarrhea or fatigue.  Most recent PSA is 0..  He is under treatment by medical oncology hematology for iron deficiency anemia has been rectal deferred for GI for endoscopy.  He also received androgen deprivation therapy through urology.  COMPLICATIONS OF TREATMENT: none  FOLLOW UP COMPLIANCE: keeps appointments   PHYSICAL EXAM:  BP 126/61   Pulse (!) 57   Temp 98.2 F (36.8 C)   Resp 18   Wt 234 lb 11.2 oz (106.5 kg)   SpO2 94%   BMI 32.73 kg/m  Well-developed well-nourished patient in NAD. HEENT reveals PERLA, EOMI, discs not visualized.  Oral cavity is clear. No oral mucosal lesions are identified. Neck is clear without evidence of cervical or supraclavicular adenopathy. Lungs are clear to A&P. Cardiac examination is essentially unremarkable with regular rate and rhythm without murmur rub or thrill. Abdomen is benign with no organomegaly or masses noted. Motor sensory and DTR levels are equal and symmetric in the upper and lower extremities. Cranial nerves II through XII are grossly intact. Proprioception is intact. No peripheral adenopathy or edema is identified. No motor or sensory levels are noted. Crude visual fields are within normal range.  RADIOLOGY RESULTS: No current films to review  PLAN: Present time patient is under  excellent biochemical control of his prostate cancer he is expressed that he does not want to be followed here in the future since he is being followed by urology in [place] I think that is fine patient does have transportation difficulties.  I would be happy to reevaluate him at any time should further radiation oncology consultation be indicated.  I would like to take this opportunity to thank you for allowing me to participate in the care of your patient.Noreene Filbert, MD

## 2019-08-01 DIAGNOSIS — D225 Melanocytic nevi of trunk: Secondary | ICD-10-CM | POA: Diagnosis not present

## 2019-08-01 DIAGNOSIS — L308 Other specified dermatitis: Secondary | ICD-10-CM | POA: Diagnosis not present

## 2019-08-01 DIAGNOSIS — Z08 Encounter for follow-up examination after completed treatment for malignant neoplasm: Secondary | ICD-10-CM | POA: Diagnosis not present

## 2019-08-01 DIAGNOSIS — D0462 Carcinoma in situ of skin of left upper limb, including shoulder: Secondary | ICD-10-CM | POA: Diagnosis not present

## 2019-08-01 DIAGNOSIS — L570 Actinic keratosis: Secondary | ICD-10-CM | POA: Diagnosis not present

## 2019-08-01 DIAGNOSIS — Z1283 Encounter for screening for malignant neoplasm of skin: Secondary | ICD-10-CM | POA: Diagnosis not present

## 2019-08-01 DIAGNOSIS — Z85828 Personal history of other malignant neoplasm of skin: Secondary | ICD-10-CM | POA: Diagnosis not present

## 2019-08-01 DIAGNOSIS — X32XXXD Exposure to sunlight, subsequent encounter: Secondary | ICD-10-CM | POA: Diagnosis not present

## 2019-08-01 DIAGNOSIS — Z85820 Personal history of malignant melanoma of skin: Secondary | ICD-10-CM | POA: Diagnosis not present

## 2019-08-14 ENCOUNTER — Ambulatory Visit: Payer: Medicare Other | Admitting: Gastroenterology

## 2019-08-23 ENCOUNTER — Other Ambulatory Visit: Payer: Self-pay | Admitting: Cardiology

## 2019-09-11 ENCOUNTER — Inpatient Hospital Stay: Payer: Medicare Other | Attending: Oncology

## 2019-09-11 DIAGNOSIS — I10 Essential (primary) hypertension: Secondary | ICD-10-CM | POA: Diagnosis not present

## 2019-09-11 DIAGNOSIS — D649 Anemia, unspecified: Secondary | ICD-10-CM

## 2019-09-11 DIAGNOSIS — D509 Iron deficiency anemia, unspecified: Secondary | ICD-10-CM | POA: Insufficient documentation

## 2019-09-11 DIAGNOSIS — Z923 Personal history of irradiation: Secondary | ICD-10-CM | POA: Diagnosis not present

## 2019-09-11 DIAGNOSIS — I4891 Unspecified atrial fibrillation: Secondary | ICD-10-CM | POA: Diagnosis not present

## 2019-09-11 DIAGNOSIS — R160 Hepatomegaly, not elsewhere classified: Secondary | ICD-10-CM | POA: Diagnosis not present

## 2019-09-11 DIAGNOSIS — C61 Malignant neoplasm of prostate: Secondary | ICD-10-CM | POA: Insufficient documentation

## 2019-09-11 DIAGNOSIS — Z7901 Long term (current) use of anticoagulants: Secondary | ICD-10-CM | POA: Insufficient documentation

## 2019-09-11 DIAGNOSIS — D500 Iron deficiency anemia secondary to blood loss (chronic): Secondary | ICD-10-CM

## 2019-09-11 LAB — CBC WITH DIFFERENTIAL/PLATELET
Abs Immature Granulocytes: 0.03 10*3/uL (ref 0.00–0.07)
Basophils Absolute: 0 10*3/uL (ref 0.0–0.1)
Basophils Relative: 0 %
Eosinophils Absolute: 0.1 10*3/uL (ref 0.0–0.5)
Eosinophils Relative: 1 %
HCT: 44 % (ref 39.0–52.0)
Hemoglobin: 13.9 g/dL (ref 13.0–17.0)
Immature Granulocytes: 0 %
Lymphocytes Relative: 13 %
Lymphs Abs: 1 10*3/uL (ref 0.7–4.0)
MCH: 29.4 pg (ref 26.0–34.0)
MCHC: 31.6 g/dL (ref 30.0–36.0)
MCV: 93 fL (ref 80.0–100.0)
Monocytes Absolute: 1.1 10*3/uL — ABNORMAL HIGH (ref 0.1–1.0)
Monocytes Relative: 15 %
Neutro Abs: 5.1 10*3/uL (ref 1.7–7.7)
Neutrophils Relative %: 71 %
Platelets: 219 10*3/uL (ref 150–400)
RBC: 4.73 MIL/uL (ref 4.22–5.81)
RDW: 16.6 % — ABNORMAL HIGH (ref 11.5–15.5)
WBC: 7.3 10*3/uL (ref 4.0–10.5)
nRBC: 0 % (ref 0.0–0.2)

## 2019-09-11 LAB — IRON AND TIBC
Iron: 67 ug/dL (ref 45–182)
Saturation Ratios: 17 % — ABNORMAL LOW (ref 17.9–39.5)
TIBC: 386 ug/dL (ref 250–450)
UIBC: 319 ug/dL

## 2019-09-11 LAB — FERRITIN: Ferritin: 23 ng/mL — ABNORMAL LOW (ref 24–336)

## 2019-09-11 LAB — FOLATE: Folate: 10.7 ng/mL (ref >5.9)

## 2019-09-11 LAB — VITAMIN B12: Vitamin B-12: 169 pg/mL — ABNORMAL LOW (ref 180–914)

## 2019-09-13 ENCOUNTER — Encounter: Payer: Self-pay | Admitting: Oncology

## 2019-09-13 ENCOUNTER — Inpatient Hospital Stay (HOSPITAL_BASED_OUTPATIENT_CLINIC_OR_DEPARTMENT_OTHER): Payer: Medicare Other | Admitting: Oncology

## 2019-09-13 ENCOUNTER — Inpatient Hospital Stay: Payer: Medicare Other

## 2019-09-13 ENCOUNTER — Other Ambulatory Visit: Payer: Self-pay

## 2019-09-13 VITALS — BP 137/79 | HR 52 | Temp 96.8°F | Resp 20 | Wt 231.4 lb

## 2019-09-13 DIAGNOSIS — C61 Malignant neoplasm of prostate: Secondary | ICD-10-CM | POA: Diagnosis not present

## 2019-09-13 DIAGNOSIS — D500 Iron deficiency anemia secondary to blood loss (chronic): Secondary | ICD-10-CM

## 2019-09-13 DIAGNOSIS — I4891 Unspecified atrial fibrillation: Secondary | ICD-10-CM | POA: Diagnosis not present

## 2019-09-13 DIAGNOSIS — E538 Deficiency of other specified B group vitamins: Secondary | ICD-10-CM

## 2019-09-13 DIAGNOSIS — D509 Iron deficiency anemia, unspecified: Secondary | ICD-10-CM | POA: Diagnosis not present

## 2019-09-13 DIAGNOSIS — R160 Hepatomegaly, not elsewhere classified: Secondary | ICD-10-CM | POA: Diagnosis not present

## 2019-09-13 DIAGNOSIS — Z923 Personal history of irradiation: Secondary | ICD-10-CM | POA: Diagnosis not present

## 2019-09-13 DIAGNOSIS — I10 Essential (primary) hypertension: Secondary | ICD-10-CM | POA: Diagnosis not present

## 2019-09-13 NOTE — Progress Notes (Signed)
Patient reports that this will be his last visit here because all his other doctors are in [place] and everything will be going thru his PCP.  States he feels the best he has felt in years.

## 2019-09-13 NOTE — Progress Notes (Signed)
Hematology/Oncology  [facility] Telephone:([phone] [phone]   Patient Care Team: Deland Pretty, MD as PCP - General (Internal Medicine) Stanford Breed Denice Bors, MD as PCP - Cardiology (Cardiology) Deland Pretty, MD (Internal Medicine) Earlie Server, MD as Referring Physician (Oncology)  REFERRING PROVIDER: Dr.Chrystal CHIEF COMPLAINTS/REASON FOR VISIT:  Follow up for prostate cancer and anemia  HISTORY OF PRESENTING ILLNESS:  Evan Dorsey is a  86 y.o.  male with PMH listed below who was referred to me for evaluation of anemia Patient was seen by radiation oncology Dr. Baruch Gouty for management of prostate cancer. 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  He was noted to have rising PSA in 2015 up to 10.9, he underwent transrectal ultrasound-guided biopsy showing a 7 of 12 cores positive for Gleason 7 (4+3)adenocarcinoma. Patient decided on active surveillance.  PSA has been rising most recently in August 2019 which was 17.2.  This prompted another trans-rectal ultrasound-guided biopsy in September 2019 and at this time with 10 out of 12 cores positive for Gleason 8 (4+4). Patient is on Eliquis for anticoagulation for atrial fibrillation had significant bleeding from prostate biopsy.  Bone scan on 03/25/2018 showed negative for osseous metastasis. 03/25/2018 CT abdomen pelvis showed no findings highly suspicious for metastatic disease in the abdomen or pelvis.  Scattered small low-attenuation liver mass are indeterminate in density and appear stable since Oct 19, 2016 CT.  More likely benign.  Mild to moderate prostato megaly Patient has been seen by medical oncology in [place] with recommendation of external beam radiation therapy.  Patient finished radiation.  Long term ADT Dr.Ottelin per Dr.Manning's note.   He noticed that patient's hemoglobin progressively worsened.   He was referred to hematology for further management. Reviewed patient's recent labs on 06/16/2018 revealed anemia with hemoglobin of 7.4 with MCV of 76. Reviewed patient's previous labs ordered by primary care physician's office, anemia is acute onset, duration is since September 2019 when he had excessive bleeding after prostate biopsy.   INTERVAL HISTORY Evan Dorsey is a 86 y.o. male who has above history reviewed by me today presents for follow up of iron deficiency anemia.  Patient has previously received IV iron treatments He feels well today . No new complaints.   Review of Systems  Constitutional: Positive for fatigue. Negative for appetite change, chills, fever and unexpected weight change.  HENT:   Positive for hearing loss. Negative for voice change.   Eyes: Negative for eye problems and icterus.  Respiratory: Negative for chest tightness, cough and shortness of breath.   Cardiovascular: Negative for chest pain and leg swelling.  Gastrointestinal: Negative for abdominal distention and abdominal pain.  Endocrine: Negative for hot flashes.  Genitourinary: Negative for difficulty urinating, dysuria and frequency.   Musculoskeletal: Negative for arthralgias.  Skin: Negative for itching and rash.  Neurological: Negative for light-headedness and numbness.  Hematological: Negative for adenopathy. Does not bruise/bleed easily.  Psychiatric/Behavioral: Negative for confusion.   MEDICAL HISTORY:  Past Medical History:  Diagnosis Date  . Adenomatous polyp   . Arthritis    "left shoulder" (03/02/2013)  . Atrial fibrillation ([place])   . Bladder cancer ([place]) 07/31/2011  . CAD (coronary artery disease)    a. remote CABG b. prior cardiac cath 2005 c. Lexiscan Myoview 07/08/12 w/o evidence of ischemia, EF 53%  . Diverticulosis   . Elevated PSA   . Frequent urination    "day  and night" (03/02/2013)  . GERD (gastroesophageal reflux disease)   . Gout    "been a long time ago"  (03/02/2013)  . History of kidney stones   . HLD (hyperlipidemia)   . HTN (hypertension)   . Iron deficiency anemia due to chronic blood loss 06/29/2018  . Myocardial infarction ([place]) 1990's   silent  . Prostate cancer [facility])     SURGICAL HISTORY: Past Surgical History:  Procedure Laterality Date  . CARDIAC CATHETERIZATION     total occlusion to LAD, LCX, RCA. LIMA patent, normal seq SVG to OM, severe stenosis in SVG to PDA, but collaterals to PLA from OM. RCA went to PDA. treated medically   . CARDIOVASCULAR STRESS TEST  02/10/08   6 mets, poor tolerance, no chest pain   . CATARACT EXTRACTION W/ INTRAOCULAR LENS IMPLANT Right 2014  . CORONARY ARTERY BYPASS GRAFT  08/1991   "CABG X4" (03/02/2013)  . CYSTOSCOPY W/ STONE MANIPULATION    . ORIF ACETABULAR FRACTURE Left 04/2007   trimalleolar fracture  . TOTAL SHOULDER ARTHROPLASTY Left 03/02/2013  . TOTAL SHOULDER ARTHROPLASTY Left 03/02/2013   Procedure: LEFT TOTAL SHOULDER ARTHROPLASTY;  Surgeon: Marin Shutter, MD;  Location: [place];  Service: Orthopedics;  Laterality: Left;  . TRANSURETHRAL RESECTION OF BLADDER TUMOR  07/31/2011   Procedure: TRANSURETHRAL RESECTION OF BLADDER TUMOR (TURBT);  Surgeon: Claybon Jabs, MD;  [facility];  Service: Urology;  Laterality: N/A;  with Gyrus  . URETEROSCOPY WITH HOLMIUM LASER LITHOTRIPSY Right 11/06/2016   Procedure: URETEROSCOPY WITH HOLMIUM LASER LITHOTRIPSY/ RETROGRADE PYELOGRAM/ STENT;  Surgeon: Kathie Rhodes, MD;  [facility];  Service: Urology;  Laterality: Right;    SOCIAL HISTORY: Social History   Socioeconomic History  . Marital status: Married    Spouse name: Not on file  . Number of children: 3  . Years of education: Not on file  . Highest education level: Not on file  Occupational History  . Occupation: retired Psychologist, sport and exercise  Tobacco Use  . Smoking status: Never Smoker  . Smokeless tobacco: Never Used  Substance and Sexual Activity  . Alcohol use: No  .  Drug use: No  . Sexual activity: Not Currently  Other Topics Concern  . Not on file  Social History Narrative   Lives at home with wife. Retired Psychologist, sport and exercise.    Social Determinants of Health   Financial Resource Strain:   . Difficulty of Paying Living Expenses:   Food Insecurity:   . Worried About Charity fundraiser in the Last Year:   . Arboriculturist in the Last Year:   Transportation Needs:   . Film/video editor (Medical):   Marland Kitchen Lack of Transportation (Non-Medical):   Physical Activity:   . Days of Exercise per Week:   . Minutes of Exercise per Session:   Stress:   . Feeling of Stress :   Social Connections:   . Frequency of Communication with Friends and Family:   . Frequency of Social Gatherings with Friends and Family:   . Attends Religious Services:   . Active Member of Clubs or Organizations:   . Attends Archivist Meetings:   Marland Kitchen Marital Status:   Intimate Partner Violence:   . Fear of Current or Ex-Partner:   . Emotionally Abused:   Marland Kitchen Physically Abused:   . Sexually Abused:     FAMILY HISTORY: Family History  Problem Relation Age of Onset  . CAD Father 26  . Prostate  cancer Brother   . CAD Brother 78  . CAD Brother 42  . Breast cancer Neg Hx   . Colon cancer Neg Hx   . Pancreatic cancer Neg Hx     ALLERGIES:  has No Known Allergies.  MEDICATIONS:  Current Outpatient Medications  Medication Sig Dispense Refill  . acetaminophen (TYLENOL) 500 MG tablet Take 500 mg by mouth every 6 (six) hours as needed.    Marland Kitchen amiodarone (PACERONE) 200 MG tablet TAKE ONE TABLET BY MOUTH DAILY 30 tablet 6  . amLODipine (NORVASC) 5 MG tablet TAKE ONE TABLET BY MOUTH EVERY DAY 90 tablet 3  . calcium carbonate (TUMS - DOSED IN MG ELEMENTAL CALCIUM) 500 MG chewable tablet Chew 2 tablets by mouth daily.    . cholecalciferol (VITAMIN D) 1000 units tablet Take 1,000 Units by mouth daily.    Marland Kitchen ELIQUIS 5 MG TABS tablet TAKE ONE TABLET BY MOUTH TWICE DAILY 180 tablet 1  .  MYRBETRIQ 50 MG TB24 tablet Take 50 mg by mouth daily.  11  . nitroGLYCERIN (NITROSTAT) 0.4 MG SL tablet PLACE 1 TABLET UNDER THE TONGUE EVERY 5 MINUTES IF NEEDED FOR CHEST PAIN. MAX = 3 TABS/EPISODE. 25 tablet 4  . psyllium (HYDROCIL/METAMUCIL) 95 % PACK Take 1 packet by mouth daily. 240 each 0  . rosuvastatin (CRESTOR) 40 MG tablet Take 40 mg by mouth daily.  3  . docusate sodium (COLACE) 100 MG capsule Take 1 capsule (100 mg total) by mouth daily. Do not take if you have loose stools (Patient not taking: Reported on 09/13/2019) 30 capsule 3  . ferrous sulfate 325 (65 FE) MG EC tablet Take 1 tablet (325 mg total) by mouth daily. (Patient not taking: Reported on 06/12/2019) 30 tablet 3  . furosemide (LASIX) 20 MG tablet Take 1 tablet (20 mg total) by mouth daily. (Patient not taking: Reported on 06/12/2019) 30 tablet 0  . potassium chloride SA (K-DUR,KLOR-CON) 20 MEQ tablet Take 1 tablet (20 mEq total) by mouth daily. (Patient not taking: Reported on 06/22/2019) 90 tablet 3   No current facility-administered medications for this visit.     PHYSICAL EXAMINATION: ECOG PERFORMANCE STATUS: 1 - Symptomatic but completely ambulatory Vitals:   09/13/19 1323  BP: 137/79  Pulse: (!) 52  Resp: 20  Temp: (!) 96.8 F (36 C)   Filed Weights   09/13/19 1323  Weight: 231 lb 6.4 oz (105 kg)    Physical Exam Constitutional:      General: He is not in acute distress. HENT:     Head: Normocephalic and atraumatic.  Eyes:     General: No scleral icterus.    Pupils: Pupils are equal, round, and reactive to light.  Cardiovascular:     Rate and Rhythm: Normal rate and regular rhythm.     Heart sounds: Normal heart sounds.  Pulmonary:     Effort: Pulmonary effort is normal. No respiratory distress.     Breath sounds: No wheezing.  Abdominal:     General: Bowel sounds are normal. There is no distension.     Palpations: Abdomen is soft. There is no mass.     Tenderness: There is no abdominal  tenderness.  Musculoskeletal:        General: No deformity. Normal range of motion.     Cervical back: Normal range of motion and neck supple.  Skin:    General: Skin is warm and dry.     Coloration: Skin is not pale.  Findings: No erythema or rash.  Neurological:     Mental Status: He is alert and oriented to person, place, and time.     Cranial Nerves: No cranial nerve deficit.     Coordination: Coordination normal.  Psychiatric:        Mood and Affect: Mood normal.      LABORATORY DATA:  I have reviewed the data as listed Lab Results  Component Value Date   WBC 7.3 09/11/2019   HGB 13.9 09/11/2019   HCT 44.0 09/11/2019   MCV 93.0 09/11/2019   PLT 219 09/11/2019   Recent Labs    11/08/18 1132 05/26/19 1105  NA 143 144  K 4.2 3.8  CL 102 107*  CO2 24 22  GLUCOSE 99 92  BUN 14 14  CREATININE 1.26 1.21  CALCIUM 9.8 10.0  GFRNONAA 52* 55*  GFRAA 60 63  PROT 6.6 6.7  ALBUMIN 4.0 4.1  AST 65* 59*  ALT 55* 45*  ALKPHOS 88 91  BILITOT 0.4 0.4  BILIDIR 0.16  --    Iron/TIBC/Ferritin/ %Sat    Component Value Date/Time   IRON 67 09/11/2019 1413   TIBC 386 09/11/2019 1413   FERRITIN 23 (L) 09/11/2019 1413   IRONPCTSAT 17 (L) 09/11/2019 1413     RADIOGRAPHIC STUDIES: I have personally reviewed the radiological images as listed and agreed with the findings in the report. 05/17/2018 CT angio chest IMPRESSION: 1. No overt aneurysmal disease of the ascending thoracic aorta by CTA measurement. The ascending aorta is top normal in caliber and measures 3.9 cm in greatest diameter. 2. Normal variant separate origin of the left vertebral artery off of the aortic arch. Bone scan on 03/25/2018 showed negative for osseous metastasis. 03/25/2018 CT abdomen pelvis showed no findings highly suspicious for metastatic disease in the abdomen or pelvis.  Scattered small low-attenuation liver mass are indeterminate in density and appear stable since Oct 19, 2016 CT.  More  likely benign.  Mild to moderate prostato megaly   ASSESSMENT & PLAN:  1. Iron deficiency anemia due to chronic blood loss   2. Prostate cancer ([place])   3. Vitamin B12 deficiency    #IDA, Labs are reviewed and discussed with patient. Hemoglobin has improved. Iron panel still shows mild iron deficiency.  I advise patient to take oral ferrous sulfate 325mg  daily.   # Vitamin b12 deficiency B12 is at 162, recommend patient to start with vitamin B12 1033mcg daily. Patient prefers to purchase OTC B12.   # prostate caner, he has finished radiation and is managed by Urology.   Patient does not want to follow up here any more. Her prefers to follow up with his PCP. He will be discharged from clinic.   All questions were answered. The patient knows to call the clinic with any problems questions or concerns.   Earlie Server, MD, PhD

## 2019-10-05 DIAGNOSIS — Z961 Presence of intraocular lens: Secondary | ICD-10-CM | POA: Diagnosis not present

## 2019-10-31 DIAGNOSIS — C61 Malignant neoplasm of prostate: Secondary | ICD-10-CM | POA: Diagnosis not present

## 2019-10-31 DIAGNOSIS — D225 Melanocytic nevi of trunk: Secondary | ICD-10-CM | POA: Diagnosis not present

## 2019-10-31 DIAGNOSIS — Z08 Encounter for follow-up examination after completed treatment for malignant neoplasm: Secondary | ICD-10-CM | POA: Diagnosis not present

## 2019-10-31 DIAGNOSIS — Z85828 Personal history of other malignant neoplasm of skin: Secondary | ICD-10-CM | POA: Diagnosis not present

## 2019-10-31 DIAGNOSIS — Z85820 Personal history of malignant melanoma of skin: Secondary | ICD-10-CM | POA: Diagnosis not present

## 2019-10-31 DIAGNOSIS — Z1283 Encounter for screening for malignant neoplasm of skin: Secondary | ICD-10-CM | POA: Diagnosis not present

## 2019-11-07 DIAGNOSIS — C61 Malignant neoplasm of prostate: Secondary | ICD-10-CM | POA: Diagnosis not present

## 2019-11-21 ENCOUNTER — Other Ambulatory Visit: Payer: Self-pay | Admitting: Cardiology

## 2019-11-21 NOTE — Telephone Encounter (Signed)
Rx request sent to pharmacy.  

## 2019-11-22 DIAGNOSIS — Z8546 Personal history of malignant neoplasm of prostate: Secondary | ICD-10-CM | POA: Diagnosis not present

## 2019-11-22 DIAGNOSIS — N390 Urinary tract infection, site not specified: Secondary | ICD-10-CM | POA: Diagnosis not present

## 2019-11-22 DIAGNOSIS — E7800 Pure hypercholesterolemia, unspecified: Secondary | ICD-10-CM | POA: Diagnosis not present

## 2019-11-22 DIAGNOSIS — I10 Essential (primary) hypertension: Secondary | ICD-10-CM | POA: Diagnosis not present

## 2019-12-06 ENCOUNTER — Telehealth: Payer: Self-pay | Admitting: *Deleted

## 2019-12-06 NOTE — Telephone Encounter (Signed)
Cx 12/11/19 labs and 12/13/19 MD visit Per pts wife " Evan Dorsey" she stated that he doesn't feel the need to RTC at this time and if anything changes they will call back to R/S.Marland Kitchen

## 2019-12-11 ENCOUNTER — Inpatient Hospital Stay: Payer: Medicare Other

## 2019-12-12 ENCOUNTER — Telehealth: Payer: Self-pay | Admitting: Cardiology

## 2019-12-12 NOTE — Telephone Encounter (Signed)
Informed wife that we are currently out of samples. Wife verbalized understanding.

## 2019-12-12 NOTE — Telephone Encounter (Signed)
° ° °  Patient calling the office for samples of medication: ° ° °1.  What medication and dosage are you requesting samples for? °ELIQUIS 5 MG TABS tablet ° °2.  Are you currently out of this medication? yes ° ° °

## 2019-12-13 ENCOUNTER — Inpatient Hospital Stay: Payer: Medicare Other | Admitting: Oncology

## 2019-12-13 DIAGNOSIS — E7800 Pure hypercholesterolemia, unspecified: Secondary | ICD-10-CM | POA: Diagnosis not present

## 2019-12-13 DIAGNOSIS — I4891 Unspecified atrial fibrillation: Secondary | ICD-10-CM | POA: Diagnosis not present

## 2019-12-13 DIAGNOSIS — I10 Essential (primary) hypertension: Secondary | ICD-10-CM | POA: Diagnosis not present

## 2019-12-13 DIAGNOSIS — Z8546 Personal history of malignant neoplasm of prostate: Secondary | ICD-10-CM | POA: Diagnosis not present

## 2019-12-13 DIAGNOSIS — Z0000 Encounter for general adult medical examination without abnormal findings: Secondary | ICD-10-CM | POA: Diagnosis not present

## 2019-12-13 DIAGNOSIS — Z7901 Long term (current) use of anticoagulants: Secondary | ICD-10-CM | POA: Diagnosis not present

## 2019-12-13 DIAGNOSIS — Z8551 Personal history of malignant neoplasm of bladder: Secondary | ICD-10-CM | POA: Diagnosis not present

## 2019-12-13 DIAGNOSIS — I2510 Atherosclerotic heart disease of native coronary artery without angina pectoris: Secondary | ICD-10-CM | POA: Diagnosis not present

## 2019-12-21 ENCOUNTER — Other Ambulatory Visit: Payer: Self-pay | Admitting: Cardiology

## 2020-01-08 NOTE — Progress Notes (Signed)
HPI: Followup coronary artery disease status post coronary artery bypassing graft and atrial fibrillation. Patient had previous coronary artery bypassing graft in 1993. Last cardiac catheterization in 2005 showed occluded LAD, circumflex and right coronary artery. LIMA to the LAD was patent, saphenous vein graft to obtuse marginal patent. The saphenous vein graft to the PDA had a severe stenosis but there were collaterals from the obtuse marginal and PLA. Medical therapy recommended. Nuclear study7/17 showed EF 59 and no ischemia.CTA December 2019 showed mildly dilated aortic root at 3.9 cm. Echocardiogram February 2020 showed normal LV function, moderate diastolic dysfunction, mild biatrial enlargement, mild aortic stenosis with mean gradient 11 mmHg and mildly dilated aortic root. Patient seen in February with excess volume and was treated with diuretic. Since he was last seen, patient denies dyspnea, chest pain, palpitations, syncope or bleeding.  Current Outpatient Medications  Medication Sig Dispense Refill  . acetaminophen (TYLENOL) 500 MG tablet Take 500 mg by mouth every 6 (six) hours as needed.    Marland Kitchen amiodarone (PACERONE) 200 MG tablet Take 1 tablet (200 mg total) by mouth daily. Please keep your upcoming appointment for refills. 30 tablet 2  . amLODipine (NORVASC) 5 MG tablet TAKE ONE TABLET BY MOUTH EVERY DAY 90 tablet 2  . calcium carbonate (TUMS - DOSED IN MG ELEMENTAL CALCIUM) 500 MG chewable tablet Chew 2 tablets by mouth daily.    . cholecalciferol (VITAMIN D) 1000 units tablet Take 1,000 Units by mouth daily.    Marland Kitchen docusate sodium (COLACE) 100 MG capsule Take 1 capsule (100 mg total) by mouth daily. Do not take if you have loose stools 30 capsule 3  . ELIQUIS 5 MG TABS tablet TAKE ONE TABLET BY MOUTH TWICE DAILY 180 tablet 1  . nitroGLYCERIN (NITROSTAT) 0.4 MG SL tablet PLACE 1 TABLET UNDER THE TONGUE EVERY 5 MINUTES IF NEEDED FOR CHEST PAIN. MAX = 3 TABS/EPISODE. 25 tablet  4  . psyllium (HYDROCIL/METAMUCIL) 95 % PACK Take 1 packet by mouth daily. 240 each 0  . rosuvastatin (CRESTOR) 40 MG tablet Take 40 mg by mouth daily.  3   No current facility-administered medications for this visit.     Past Medical History:  Diagnosis Date  . Adenomatous polyp   . Arthritis    "left shoulder" (03/02/2013)  . Atrial fibrillation ([place])   . Bladder cancer ([place]) 07/31/2011  . CAD (coronary artery disease)    a. remote CABG b. prior cardiac cath 2005 c. Lexiscan Myoview 07/08/12 w/o evidence of ischemia, EF 53%  . Diverticulosis   . Elevated PSA   . Frequent urination    "day and night" (03/02/2013)  . GERD (gastroesophageal reflux disease)   . Gout    "been a long time ago" (03/02/2013)  . History of kidney stones   . HLD (hyperlipidemia)   . HTN (hypertension)   . Iron deficiency anemia due to chronic blood loss 06/29/2018  . Myocardial infarction ([place]) 1990's   silent  . Prostate cancer [facility])     Past Surgical History:  Procedure Laterality Date  . CARDIAC CATHETERIZATION     total occlusion to LAD, LCX, RCA. LIMA patent, normal seq SVG to OM, severe stenosis in SVG to PDA, but collaterals to PLA from OM. RCA went to PDA. treated medically   . CARDIOVASCULAR STRESS TEST  02/10/08   6 mets, poor tolerance, no chest pain   . CATARACT EXTRACTION W/ INTRAOCULAR LENS IMPLANT Right 2014  . CORONARY ARTERY BYPASS  GRAFT  08/1991   "CABG X4" (03/02/2013)  . CYSTOSCOPY W/ STONE MANIPULATION    . ORIF ACETABULAR FRACTURE Left 04/2007   trimalleolar fracture  . TOTAL SHOULDER ARTHROPLASTY Left 03/02/2013  . TOTAL SHOULDER ARTHROPLASTY Left 03/02/2013   Procedure: LEFT TOTAL SHOULDER ARTHROPLASTY;  Surgeon: Marin Shutter, MD;  Location: [facility];  Service: Orthopedics;  Laterality: Left;  . TRANSURETHRAL RESECTION OF BLADDER TUMOR  07/31/2011   Procedure: TRANSURETHRAL RESECTION OF BLADDER TUMOR (TURBT);  Surgeon: Claybon Jabs, MD;  Location: [facility];  Service: Urology;   Laterality: N/A;  with [place]  . URETEROSCOPY WITH HOLMIUM LASER LITHOTRIPSY Right 11/06/2016   Procedure: URETEROSCOPY WITH HOLMIUM LASER LITHOTRIPSY/ RETROGRADE PYELOGRAM/ STENT;  Surgeon: Kathie Rhodes, MD;  Location: [facility];  Service: Urology;  Laterality: Right;    Social History   Socioeconomic History  . Marital status: Married    Spouse name: Not on file  . Number of children: 3  . Years of education: Not on file  . Highest education level: Not on file  Occupational History  . Occupation: retired Psychologist, sport and exercise  Tobacco Use  . Smoking status: Never Smoker  . Smokeless tobacco: Never Used  Vaping Use  . Vaping Use: Never used  Substance and Sexual Activity  . Alcohol use: No  . Drug use: No  . Sexual activity: Not Currently  Other Topics Concern  . Not on file  Social History Narrative   Lives at home with wife. Retired Psychologist, sport and exercise.    Social Determinants of Health   Financial Resource Strain:   . Difficulty of Paying Living Expenses:   Food Insecurity:   . Worried About Charity fundraiser in the Last Year:   . Arboriculturist in the Last Year:   Transportation Needs:   . Film/video editor (Medical):   Marland Kitchen Lack of Transportation (Non-Medical):   Physical Activity:   . Days of Exercise per Week:   . Minutes of Exercise per Session:   Stress:   . Feeling of Stress :   Social Connections:   . Frequency of Communication with Friends and Family:   . Frequency of Social Gatherings with Friends and Family:   . Attends Religious Services:   . Active Member of Clubs or Organizations:   . Attends Archivist Meetings:   Marland Kitchen Marital Status:   Intimate Partner Violence:   . Fear of Current or Ex-Partner:   . Emotionally Abused:   Marland Kitchen Physically Abused:   . Sexually Abused:     Family History  Problem Relation Age of Onset  . CAD Father 75  . Prostate cancer Brother   . CAD Brother 88  . CAD Brother 60  . Breast cancer Neg Hx   . Colon cancer  Neg Hx   . Pancreatic cancer Neg Hx     ROS: no fevers or chills, productive cough, hemoptysis, dysphasia, odynophagia, melena, hematochezia, dysuria, hematuria, rash, seizure activity, orthopnea, PND, pedal edema, claudication. Remaining systems are negative.  Physical Exam: Well-developed well-nourished in no acute distress.  Skin is warm and dry.  HEENT is normal.  Neck is supple.  Chest is clear to auscultation with normal expansion.  Cardiovascular exam is regular rate and rhythm.  Distant heart sounds.  2/6 systolic murmur. Abdominal exam nontender or distended. No masses palpated. Extremities show no edema. neuro grossly intact   A/P  1 coronary artery disease-patient denies recurrent chest pain.  Continue statin.  He is  not on aspirin given need for anticoagulation.  2 paroxysmal atrial fibrillation-continue amiodarone.  Continue apixaban.  Check TSH, chest x-ray.  He has had liver functions, hemoglobin and renal function checked recently.  3 hypertension-patient's blood pressure is controlled.  Continue present medical regimen.  4 hyperlipidemia-continue statin.  5 mild aortic stenosis-we will plan repeat echocardiogram.  6 dilated thoracic aorta-aorta at the upper limits of normal on most recent CTA.  7 chronic diastolic congestive heart failure-patient is euvolemic today.  He will continue fluid restriction and low-sodium diet.  [name], MD

## 2020-01-16 ENCOUNTER — Ambulatory Visit (INDEPENDENT_AMBULATORY_CARE_PROVIDER_SITE_OTHER): Payer: Medicare Other | Admitting: Cardiology

## 2020-01-16 ENCOUNTER — Encounter: Payer: Self-pay | Admitting: Cardiology

## 2020-01-16 ENCOUNTER — Other Ambulatory Visit: Payer: Self-pay

## 2020-01-16 ENCOUNTER — Ambulatory Visit
Admission: RE | Admit: 2020-01-16 | Discharge: 2020-01-16 | Disposition: A | Discharge status: Home / Self Care | Payer: Medicare Other | Source: Ambulatory Visit | Attending: Cardiology | Admitting: Cardiology

## 2020-01-16 VITALS — BP 130/74 | HR 76 | Temp 97.1°F | Ht 71.0 in | Wt 229.0 lb

## 2020-01-16 DIAGNOSIS — I350 Nonrheumatic aortic (valve) stenosis: Secondary | ICD-10-CM | POA: Diagnosis not present

## 2020-01-16 DIAGNOSIS — I480 Paroxysmal atrial fibrillation: Secondary | ICD-10-CM

## 2020-01-16 DIAGNOSIS — E7800 Pure hypercholesterolemia, unspecified: Secondary | ICD-10-CM | POA: Diagnosis not present

## 2020-01-16 DIAGNOSIS — I2510 Atherosclerotic heart disease of native coronary artery without angina pectoris: Secondary | ICD-10-CM | POA: Diagnosis not present

## 2020-01-16 DIAGNOSIS — Z5181 Encounter for therapeutic drug level monitoring: Secondary | ICD-10-CM | POA: Diagnosis not present

## 2020-01-16 DIAGNOSIS — I10 Essential (primary) hypertension: Secondary | ICD-10-CM | POA: Diagnosis not present

## 2020-01-16 DIAGNOSIS — Z79899 Other long term (current) drug therapy: Secondary | ICD-10-CM | POA: Diagnosis not present

## 2020-01-16 LAB — TSH: TSH: 3.8 u[IU]/mL (ref 0.450–4.500)

## 2020-01-16 NOTE — Patient Instructions (Signed)
Medication Instructions:  NO CHANGE *If you need a refill on your cardiac medications before your next appointment, please call your pharmacy*   Lab Work: Your physician recommends that you HAVE LAB WORK TODAY If you have labs (blood work) drawn today and your tests are completely normal, you will receive your results only by: Marland Kitchen MyChart Message (if you have MyChart) OR . A paper copy in the mail If you have any lab test that is abnormal or we need to change your treatment, we will call you to review the results.   Testing/Procedures:  A chest x-ray takes a picture of the organs and structures inside the chest, including the heart, lungs, and blood vessels. This test can show several things, including, whether the heart is enlarges; whether fluid is building up in the lungs; and whether pacemaker / defibrillator leads are still in place. [address]  Your physician has requested that you have an echocardiogram. Echocardiography is a painless test that uses sound waves to create images of your heart. It provides your doctor with information about the size and shape of your heart and how well your heart's chambers and valves are working. This procedure takes approximately one hour. There are no restrictions for this procedure.[place]    Follow-Up: At [facility], you and your health needs are our priority.  As part of our continuing mission to provide you with exceptional heart care, we have created designated Provider Care Teams.  These Care Teams include your primary Cardiologist (physician) and Advanced Practice Providers (APPs -  Physician Assistants and Nurse Practitioners) who all work together to provide you with the care you need, when you need it.  We recommend signing up for the patient portal called "MyChart".  Sign up information is provided on this After Visit Summary.  MyChart is used to connect with patients for Virtual Visits  (Telemedicine).  Patients are able to view lab/test results, encounter notes, upcoming appointments, etc.  Non-urgent messages can be sent to your provider as well.   To learn more about what you can do with MyChart, go to [email or web address].    Your next appointment:   6 month(s)  The format for your next appointment:   In Person  Provider:   You may see [name], MD or one of the following Advanced Practice Providers on your designated Care Team:    Kerin Ransom, PA-C  [place], [place]  Coletta Memos, [place]

## 2020-01-23 DIAGNOSIS — L570 Actinic keratosis: Secondary | ICD-10-CM | POA: Diagnosis not present

## 2020-01-23 DIAGNOSIS — Z08 Encounter for follow-up examination after completed treatment for malignant neoplasm: Secondary | ICD-10-CM | POA: Diagnosis not present

## 2020-01-23 DIAGNOSIS — Z1283 Encounter for screening for malignant neoplasm of skin: Secondary | ICD-10-CM | POA: Diagnosis not present

## 2020-01-23 DIAGNOSIS — L821 Other seborrheic keratosis: Secondary | ICD-10-CM | POA: Diagnosis not present

## 2020-01-23 DIAGNOSIS — X32XXXD Exposure to sunlight, subsequent encounter: Secondary | ICD-10-CM | POA: Diagnosis not present

## 2020-01-23 DIAGNOSIS — Z85820 Personal history of malignant melanoma of skin: Secondary | ICD-10-CM | POA: Diagnosis not present

## 2020-02-01 ENCOUNTER — Other Ambulatory Visit: Payer: Self-pay

## 2020-02-01 ENCOUNTER — Ambulatory Visit (HOSPITAL_COMMUNITY): Payer: Medicare Other | Attending: Cardiology

## 2020-02-01 DIAGNOSIS — I350 Nonrheumatic aortic (valve) stenosis: Secondary | ICD-10-CM | POA: Diagnosis not present

## 2020-02-01 LAB — ECHOCARDIOGRAM COMPLETE
Area-P 1/2: 1.7 cm2
S' Lateral: 3.2 cm

## 2020-02-01 MED ORDER — PERFLUTREN LIPID MICROSPHERE
1.0000 mL | INTRAVENOUS | Status: AC | PRN
  Administered 2020-02-01: 2 mL via INTRAVENOUS

## 2020-02-05 ENCOUNTER — Other Ambulatory Visit: Payer: Self-pay | Admitting: *Deleted

## 2020-02-05 DIAGNOSIS — I350 Nonrheumatic aortic (valve) stenosis: Secondary | ICD-10-CM

## 2020-02-20 ENCOUNTER — Other Ambulatory Visit: Payer: Self-pay | Admitting: Cardiology

## 2020-03-16 IMAGING — CT CT ANGIO CHEST
2 of 7 series · 18 of 46 positions shown · IV contrast (iopamidol)
Comparison: Echocardiography report on 11/01/2017

CLINICAL DATA: Evidence of dilatation of the ascending thoracic
aorta by prior echocardiography.

EXAM:
CT ANGIOGRAPHY CHEST WITH CONTRAST
TECHNIQUE: Multidetector CT imaging of the chest was performed using the
standard protocol during bolus administration of intravenous
contrast. Multiplanar CT image reconstructions and MIPs were
obtained to evaluate the vascular anatomy.
CONTRAST:  100mL 3ZYHNP-V5A IOPAMIDOL (3ZYHNP-V5A) INJECTION 76%

[Series 4: aorta 3.0 i31f 2 · axial · 0.79mm/px · z∈[-361,-58]mm · 15 of 111 slices shown]
[im 5/111  lung]
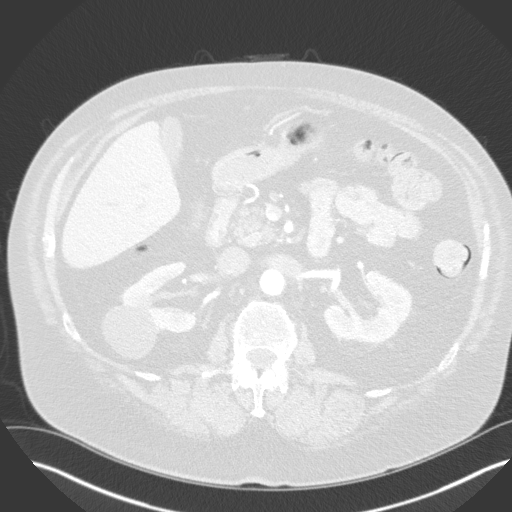
[im 13/111  soft-tissue]
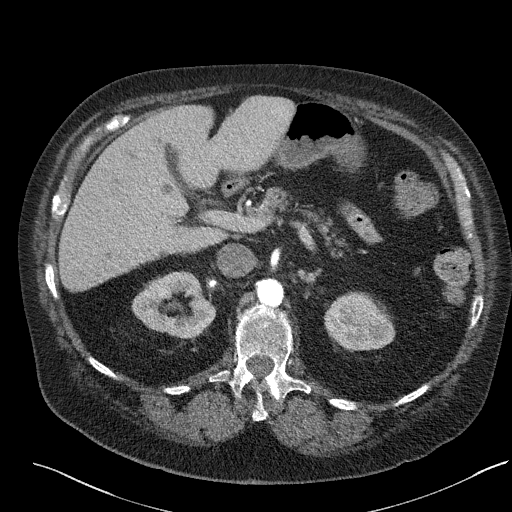
[im 21/111  lung]
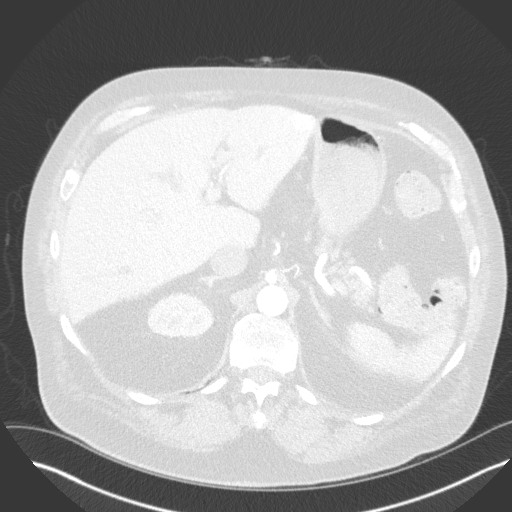
[im 29/111  soft-tissue]
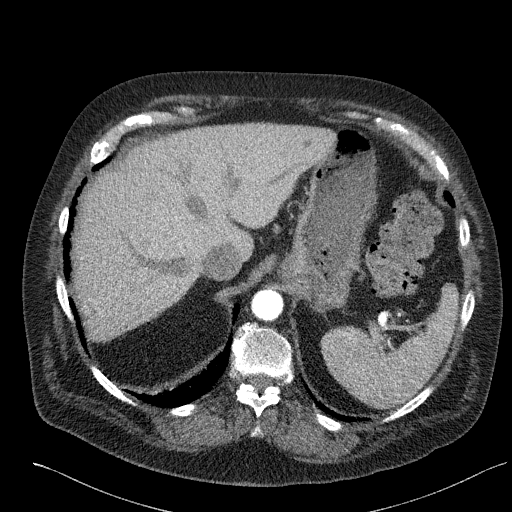
[im 33/111  lung]
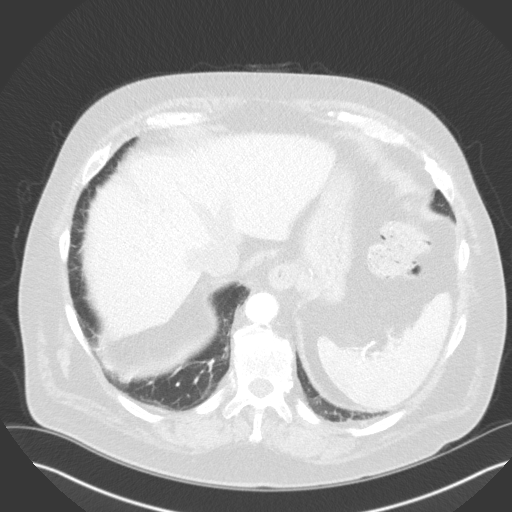
[im 41/111  soft-tissue]
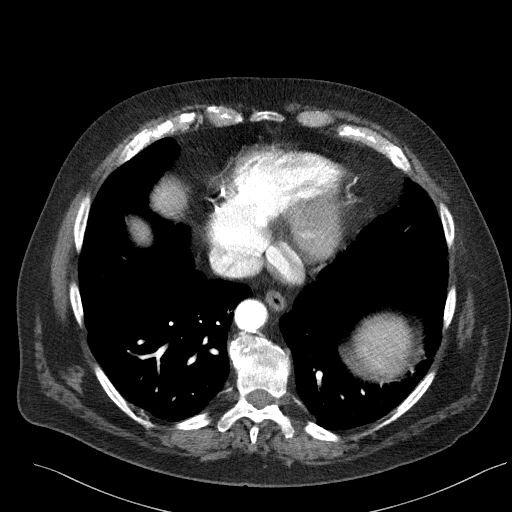
[im 49/111  lung]
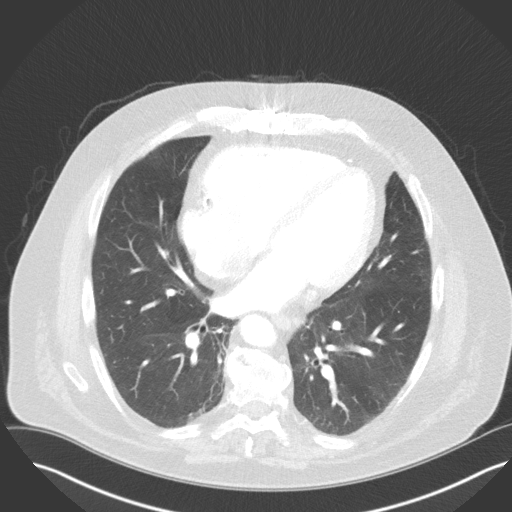
[im 58/111  soft-tissue]
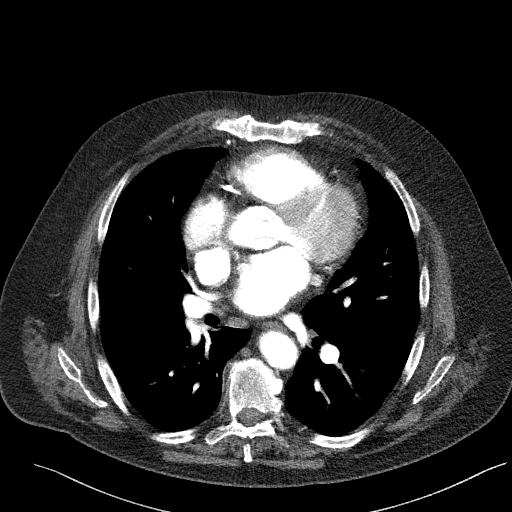
[im 62/111  lung]
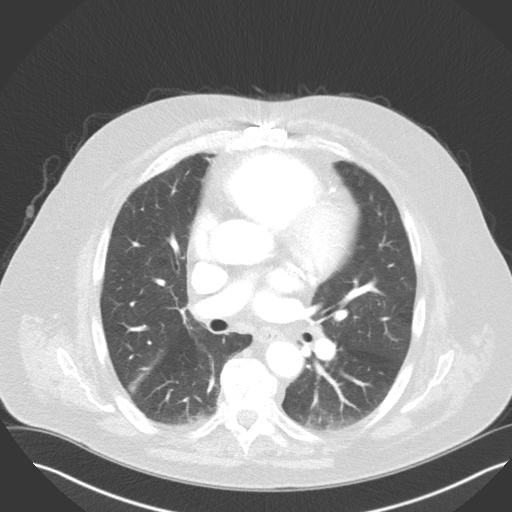
[im 70/111  soft-tissue]
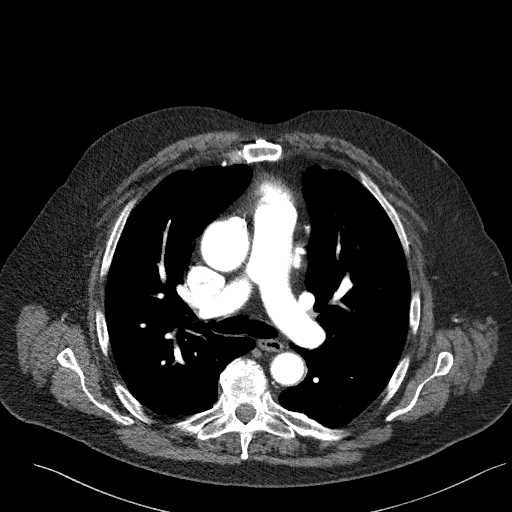
[im 78/111  lung]
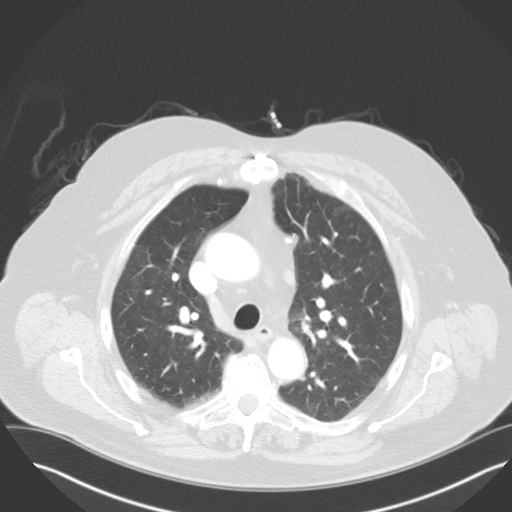
[im 82/111  soft-tissue]
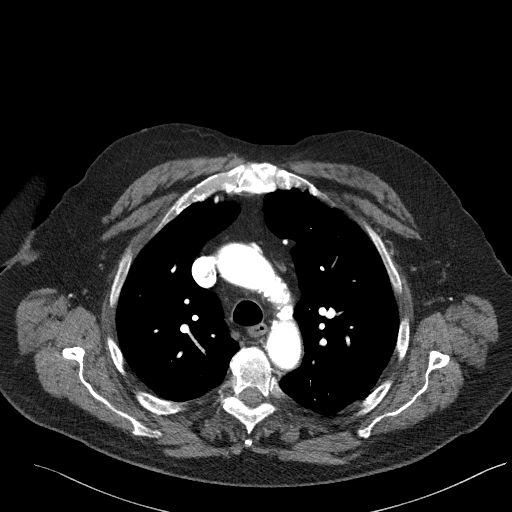
[im 90/111  lung]
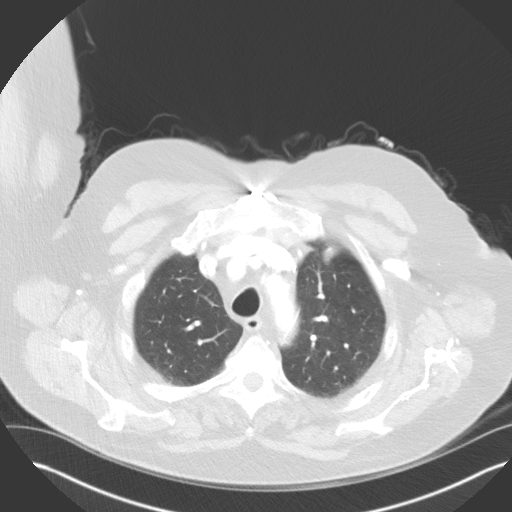
[im 98/111  soft-tissue]
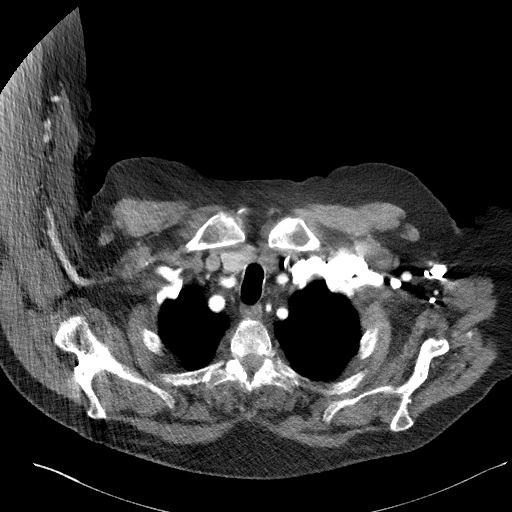
[im 106/111  lung]
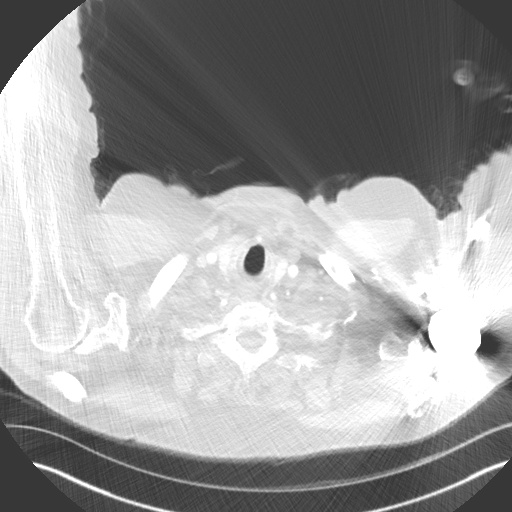

[Series 7: coronals · coronal · 0.64mm/px · 3 of 149 slices shown]
[im 38/149  soft-tissue]
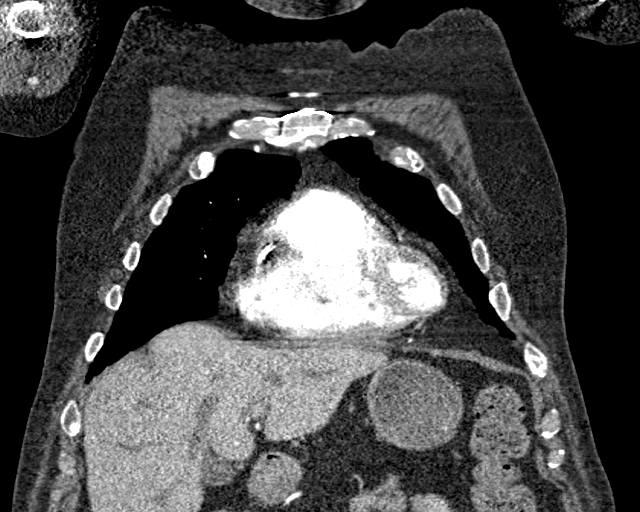
[im 75/149  soft-tissue]
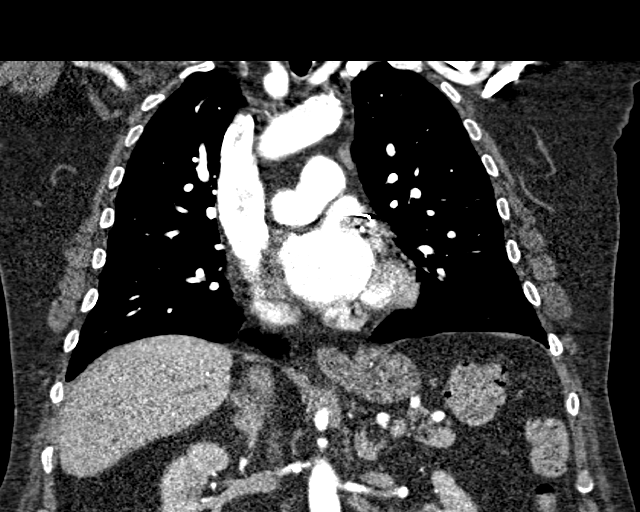
[im 112/149  soft-tissue]
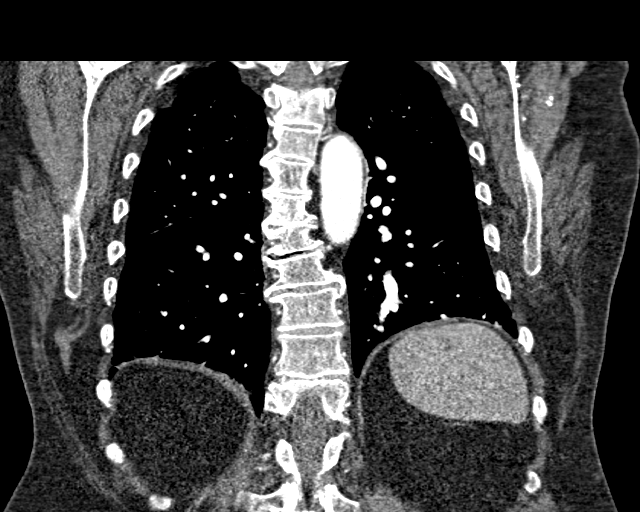

[18 of 46 positions shown; findings below may reference images not displayed]

FINDINGS: Cardiovascular: The aortic root measures 3.4-3.5 cm at the level of
the sinuses of Valsalva. The ascending thoracic aorta is not overtly
aneurysmal and measures 3.9 cm in greatest diameter. The aortic arch
measures 3.0 cm. The descending thoracic aorta measures 2.6 cm.
Proximal great vessels show no significant obstructive disease.
There is normal variant separate origin of the left vertebral artery
off of the aortic arch.

Status post prior CABG. The heart is moderately enlarged. No
pericardial fluid identified. Central pulmonary arteries are normal
in caliber.

Mediastinum/Nodes: No enlarged mediastinal, hilar, or axillary lymph
nodes. Thyroid gland, trachea, and esophagus demonstrate no
significant findings.

Lungs/Pleura: Scattered areas of parenchymal scarring are present in
both lungs. There is no evidence of pulmonary edema, consolidation,
pneumothorax, nodule or pleural fluid.

Upper Abdomen: No acute abnormality.

Musculoskeletal: No chest wall abnormality. No acute or significant
osseous findings.

Review of the MIP images confirms the above findings.
IMPRESSION: 1. No overt aneurysmal disease of the ascending thoracic aorta by
CTA measurement. The ascending aorta is top normal in caliber and
measures 3.9 cm in greatest diameter.
2. Normal variant separate origin of the left vertebral artery off
of the aortic arch.

## 2020-04-24 ENCOUNTER — Other Ambulatory Visit: Payer: Self-pay | Admitting: Cardiology

## 2020-04-25 DIAGNOSIS — C61 Malignant neoplasm of prostate: Secondary | ICD-10-CM | POA: Diagnosis not present

## 2020-04-30 DIAGNOSIS — R3915 Urgency of urination: Secondary | ICD-10-CM | POA: Diagnosis not present

## 2020-04-30 DIAGNOSIS — Z08 Encounter for follow-up examination after completed treatment for malignant neoplasm: Secondary | ICD-10-CM | POA: Diagnosis not present

## 2020-04-30 DIAGNOSIS — C61 Malignant neoplasm of prostate: Secondary | ICD-10-CM | POA: Diagnosis not present

## 2020-04-30 DIAGNOSIS — Z1283 Encounter for screening for malignant neoplasm of skin: Secondary | ICD-10-CM | POA: Diagnosis not present

## 2020-04-30 DIAGNOSIS — D225 Melanocytic nevi of trunk: Secondary | ICD-10-CM | POA: Diagnosis not present

## 2020-04-30 DIAGNOSIS — Z85820 Personal history of malignant melanoma of skin: Secondary | ICD-10-CM | POA: Diagnosis not present

## 2020-05-01 DIAGNOSIS — H9313 Tinnitus, bilateral: Secondary | ICD-10-CM | POA: Diagnosis not present

## 2020-05-01 DIAGNOSIS — H903 Sensorineural hearing loss, bilateral: Secondary | ICD-10-CM | POA: Diagnosis not present

## 2020-05-17 DIAGNOSIS — Z03818 Encounter for observation for suspected exposure to other biological agents ruled out: Secondary | ICD-10-CM | POA: Diagnosis not present

## 2020-05-21 ENCOUNTER — Other Ambulatory Visit: Payer: Self-pay | Admitting: Cardiology

## 2020-05-27 DIAGNOSIS — R059 Cough, unspecified: Secondary | ICD-10-CM | POA: Diagnosis not present

## 2020-05-27 DIAGNOSIS — J069 Acute upper respiratory infection, unspecified: Secondary | ICD-10-CM | POA: Diagnosis not present

## 2020-06-03 DIAGNOSIS — D649 Anemia, unspecified: Secondary | ICD-10-CM | POA: Diagnosis not present

## 2020-06-03 DIAGNOSIS — J189 Pneumonia, unspecified organism: Secondary | ICD-10-CM | POA: Diagnosis not present

## 2020-06-10 DIAGNOSIS — J189 Pneumonia, unspecified organism: Secondary | ICD-10-CM | POA: Diagnosis not present

## 2020-06-10 DIAGNOSIS — Z1212 Encounter for screening for malignant neoplasm of rectum: Secondary | ICD-10-CM | POA: Diagnosis not present

## 2020-06-10 DIAGNOSIS — D649 Anemia, unspecified: Secondary | ICD-10-CM | POA: Diagnosis not present

## 2020-06-26 DIAGNOSIS — N390 Urinary tract infection, site not specified: Secondary | ICD-10-CM | POA: Diagnosis not present

## 2020-06-26 DIAGNOSIS — D72829 Elevated white blood cell count, unspecified: Secondary | ICD-10-CM | POA: Diagnosis not present

## 2020-06-26 DIAGNOSIS — D649 Anemia, unspecified: Secondary | ICD-10-CM | POA: Diagnosis not present

## 2020-06-26 DIAGNOSIS — M545 Low back pain, unspecified: Secondary | ICD-10-CM | POA: Diagnosis not present

## 2020-06-26 DIAGNOSIS — J189 Pneumonia, unspecified organism: Secondary | ICD-10-CM | POA: Diagnosis not present

## 2020-06-26 DIAGNOSIS — G8929 Other chronic pain: Secondary | ICD-10-CM | POA: Diagnosis not present

## 2020-06-26 DIAGNOSIS — Z7901 Long term (current) use of anticoagulants: Secondary | ICD-10-CM | POA: Diagnosis not present

## 2020-06-26 DIAGNOSIS — R195 Other fecal abnormalities: Secondary | ICD-10-CM | POA: Diagnosis not present

## 2020-06-26 DIAGNOSIS — E611 Iron deficiency: Secondary | ICD-10-CM | POA: Diagnosis not present

## 2020-06-26 DIAGNOSIS — Z79899 Other long term (current) drug therapy: Secondary | ICD-10-CM | POA: Diagnosis not present

## 2020-06-27 DIAGNOSIS — Z951 Presence of aortocoronary bypass graft: Secondary | ICD-10-CM | POA: Diagnosis not present

## 2020-06-27 DIAGNOSIS — Z96612 Presence of left artificial shoulder joint: Secondary | ICD-10-CM | POA: Diagnosis not present

## 2020-06-27 DIAGNOSIS — R310 Gross hematuria: Secondary | ICD-10-CM | POA: Diagnosis not present

## 2020-06-27 DIAGNOSIS — D649 Anemia, unspecified: Secondary | ICD-10-CM | POA: Diagnosis not present

## 2020-06-27 DIAGNOSIS — E7800 Pure hypercholesterolemia, unspecified: Secondary | ICD-10-CM | POA: Diagnosis not present

## 2020-06-27 DIAGNOSIS — Z8551 Personal history of malignant neoplasm of bladder: Secondary | ICD-10-CM | POA: Diagnosis not present

## 2020-06-27 DIAGNOSIS — R3121 Asymptomatic microscopic hematuria: Secondary | ICD-10-CM | POA: Diagnosis not present

## 2020-06-27 DIAGNOSIS — I2510 Atherosclerotic heart disease of native coronary artery without angina pectoris: Secondary | ICD-10-CM | POA: Diagnosis not present

## 2020-06-27 DIAGNOSIS — M109 Gout, unspecified: Secondary | ICD-10-CM | POA: Diagnosis not present

## 2020-06-27 DIAGNOSIS — I4891 Unspecified atrial fibrillation: Secondary | ICD-10-CM | POA: Diagnosis not present

## 2020-06-27 DIAGNOSIS — E611 Iron deficiency: Secondary | ICD-10-CM | POA: Diagnosis not present

## 2020-06-27 DIAGNOSIS — Z6832 Body mass index (BMI) 32.0-32.9, adult: Secondary | ICD-10-CM | POA: Diagnosis not present

## 2020-06-27 DIAGNOSIS — Z8546 Personal history of malignant neoplasm of prostate: Secondary | ICD-10-CM | POA: Diagnosis not present

## 2020-06-27 DIAGNOSIS — E669 Obesity, unspecified: Secondary | ICD-10-CM | POA: Diagnosis not present

## 2020-06-27 DIAGNOSIS — K5790 Diverticulosis of intestine, part unspecified, without perforation or abscess without bleeding: Secondary | ICD-10-CM | POA: Diagnosis not present

## 2020-06-27 DIAGNOSIS — Z87442 Personal history of urinary calculi: Secondary | ICD-10-CM | POA: Diagnosis not present

## 2020-06-27 DIAGNOSIS — Z86010 Personal history of colonic polyps: Secondary | ICD-10-CM | POA: Diagnosis not present

## 2020-06-27 DIAGNOSIS — G8929 Other chronic pain: Secondary | ICD-10-CM | POA: Diagnosis not present

## 2020-06-27 DIAGNOSIS — Z7901 Long term (current) use of anticoagulants: Secondary | ICD-10-CM | POA: Diagnosis not present

## 2020-06-27 DIAGNOSIS — I10 Essential (primary) hypertension: Secondary | ICD-10-CM | POA: Diagnosis not present

## 2020-06-27 DIAGNOSIS — J189 Pneumonia, unspecified organism: Secondary | ICD-10-CM | POA: Diagnosis not present

## 2020-06-27 DIAGNOSIS — M545 Low back pain, unspecified: Secondary | ICD-10-CM | POA: Diagnosis not present

## 2020-06-27 DIAGNOSIS — H9193 Unspecified hearing loss, bilateral: Secondary | ICD-10-CM | POA: Diagnosis not present

## 2020-07-03 DIAGNOSIS — I4891 Unspecified atrial fibrillation: Secondary | ICD-10-CM | POA: Diagnosis not present

## 2020-07-03 DIAGNOSIS — J189 Pneumonia, unspecified organism: Secondary | ICD-10-CM | POA: Diagnosis not present

## 2020-07-03 DIAGNOSIS — G8929 Other chronic pain: Secondary | ICD-10-CM | POA: Diagnosis not present

## 2020-07-03 DIAGNOSIS — I2510 Atherosclerotic heart disease of native coronary artery without angina pectoris: Secondary | ICD-10-CM | POA: Diagnosis not present

## 2020-07-03 DIAGNOSIS — M545 Low back pain, unspecified: Secondary | ICD-10-CM | POA: Diagnosis not present

## 2020-07-03 DIAGNOSIS — I10 Essential (primary) hypertension: Secondary | ICD-10-CM | POA: Diagnosis not present

## 2020-07-08 DIAGNOSIS — G8929 Other chronic pain: Secondary | ICD-10-CM | POA: Diagnosis not present

## 2020-07-08 DIAGNOSIS — I2510 Atherosclerotic heart disease of native coronary artery without angina pectoris: Secondary | ICD-10-CM | POA: Diagnosis not present

## 2020-07-08 DIAGNOSIS — I10 Essential (primary) hypertension: Secondary | ICD-10-CM | POA: Diagnosis not present

## 2020-07-08 DIAGNOSIS — M545 Low back pain, unspecified: Secondary | ICD-10-CM | POA: Diagnosis not present

## 2020-07-08 DIAGNOSIS — J189 Pneumonia, unspecified organism: Secondary | ICD-10-CM | POA: Diagnosis not present

## 2020-07-08 DIAGNOSIS — I4891 Unspecified atrial fibrillation: Secondary | ICD-10-CM | POA: Diagnosis not present

## 2020-07-09 ENCOUNTER — Telehealth: Payer: Self-pay | Admitting: *Deleted

## 2020-07-09 ENCOUNTER — Other Ambulatory Visit: Payer: Self-pay | Admitting: Oncology

## 2020-07-09 NOTE — Telephone Encounter (Signed)
Thanks.  Team please schedule him to get labs cbc cmp iron tibc ferritin 1-2 days prior to followup MD visit with me +/- Venofer. Thanks.

## 2020-07-09 NOTE — Telephone Encounter (Signed)
Patients wife called to find out if your office had received a referral for this patient from Dr. Shelia Media at [place] for iron infusions. You saw the patient in March of last year. The wife states that Dr. Shelia Media was going to send a referral to you.

## 2020-07-09 NOTE — Telephone Encounter (Signed)
I do see a referral scanned in chart.

## 2020-07-10 ENCOUNTER — Other Ambulatory Visit: Payer: Self-pay

## 2020-07-10 DIAGNOSIS — D500 Iron deficiency anemia secondary to blood loss (chronic): Secondary | ICD-10-CM

## 2020-07-10 NOTE — Telephone Encounter (Signed)
Evan Dorsey please schedule appt as requested by MD and notify pt of appts. Lab orders entered.

## 2020-07-10 NOTE — Telephone Encounter (Signed)
Done..  Pts wife Evan Dorsey was made aware of pts sched 1/27 lab and his 07/12/20 MD/+/- Venofer appts

## 2020-07-11 ENCOUNTER — Inpatient Hospital Stay: Payer: Medicare Other

## 2020-07-11 ENCOUNTER — Other Ambulatory Visit: Payer: Self-pay | Admitting: Oncology

## 2020-07-11 ENCOUNTER — Inpatient Hospital Stay: Payer: Medicare Other | Attending: Oncology

## 2020-07-11 ENCOUNTER — Other Ambulatory Visit: Payer: Self-pay

## 2020-07-11 VITALS — BP 119/53 | HR 55 | Temp 97.8°F | Resp 20

## 2020-07-11 DIAGNOSIS — E876 Hypokalemia: Secondary | ICD-10-CM | POA: Diagnosis not present

## 2020-07-11 DIAGNOSIS — D500 Iron deficiency anemia secondary to blood loss (chronic): Secondary | ICD-10-CM

## 2020-07-11 DIAGNOSIS — Z79899 Other long term (current) drug therapy: Secondary | ICD-10-CM | POA: Diagnosis not present

## 2020-07-11 DIAGNOSIS — D509 Iron deficiency anemia, unspecified: Secondary | ICD-10-CM | POA: Insufficient documentation

## 2020-07-11 LAB — IRON AND TIBC
Iron: 140 ug/dL (ref 45–182)
Saturation Ratios: 39 % (ref 17.9–39.5)
TIBC: 364 ug/dL (ref 250–450)
UIBC: 224 ug/dL

## 2020-07-11 LAB — CBC WITH DIFFERENTIAL/PLATELET
Abs Immature Granulocytes: 0.02 10*3/uL (ref 0.00–0.07)
Basophils Absolute: 0 10*3/uL (ref 0.0–0.1)
Basophils Relative: 0 %
Eosinophils Absolute: 0.1 10*3/uL (ref 0.0–0.5)
Eosinophils Relative: 1 %
HCT: 30.9 % — ABNORMAL LOW (ref 39.0–52.0)
Hemoglobin: 9 g/dL — ABNORMAL LOW (ref 13.0–17.0)
Immature Granulocytes: 0 %
Lymphocytes Relative: 17 %
Lymphs Abs: 1.3 10*3/uL (ref 0.7–4.0)
MCH: 24.9 pg — ABNORMAL LOW (ref 26.0–34.0)
MCHC: 29.1 g/dL — ABNORMAL LOW (ref 30.0–36.0)
MCV: 85.6 fL (ref 80.0–100.0)
Monocytes Absolute: 1.3 10*3/uL — ABNORMAL HIGH (ref 0.1–1.0)
Monocytes Relative: 18 %
Neutro Abs: 4.7 10*3/uL (ref 1.7–7.7)
Neutrophils Relative %: 64 %
Platelets: 261 10*3/uL (ref 150–400)
RBC: 3.61 MIL/uL — ABNORMAL LOW (ref 4.22–5.81)
RDW: 22.5 % — ABNORMAL HIGH (ref 11.5–15.5)
WBC: 7.3 10*3/uL (ref 4.0–10.5)
nRBC: 0 % (ref 0.0–0.2)

## 2020-07-11 LAB — FERRITIN: Ferritin: 11 ng/mL — ABNORMAL LOW (ref 24–336)

## 2020-07-11 LAB — COMPREHENSIVE METABOLIC PANEL
ALT: 52 U/L — ABNORMAL HIGH (ref 0–44)
AST: 88 U/L — ABNORMAL HIGH (ref 15–41)
Albumin: 2.9 g/dL — ABNORMAL LOW (ref 3.5–5.0)
Alkaline Phosphatase: 89 U/L (ref 38–126)
Anion gap: 8 (ref 5–15)
BUN: 14 mg/dL (ref 8–23)
CO2: 23 mmol/L (ref 22–32)
Calcium: 8.7 mg/dL — ABNORMAL LOW (ref 8.9–10.3)
Chloride: 107 mmol/L (ref 98–111)
Creatinine, Ser: 1.52 mg/dL — ABNORMAL HIGH (ref 0.61–1.24)
GFR, Estimated: 45 mL/min — ABNORMAL LOW (ref >60)
Glucose, Bld: 93 mg/dL (ref 70–99)
Potassium: 2.7 mmol/L — CL (ref 3.5–5.1)
Sodium: 138 mmol/L (ref 135–145)
Total Bilirubin: 0.5 mg/dL (ref 0.3–1.2)
Total Protein: 6.3 g/dL — ABNORMAL LOW (ref 6.5–8.1)

## 2020-07-11 MED ORDER — SODIUM CHLORIDE 0.9 % IV SOLN
20.0000 meq | Freq: Once | INTRAVENOUS | Status: AC
  Administered 2020-07-11: 20 meq via INTRAVENOUS
  Filled 2020-07-11: qty 10

## 2020-07-11 MED ORDER — HEPARIN SOD (PORK) LOCK FLUSH 100 UNIT/ML IV SOLN
250.0000 [IU] | Freq: Once | INTRAVENOUS | Status: DC | PRN
  Filled 2020-07-11: qty 5

## 2020-07-11 MED ORDER — SODIUM CHLORIDE 0.9 % IV SOLN
INTRAVENOUS | Status: DC
  Filled 2020-07-11: qty 250

## 2020-07-11 MED ORDER — HEPARIN SOD (PORK) LOCK FLUSH 100 UNIT/ML IV SOLN
500.0000 [IU] | Freq: Once | INTRAVENOUS | Status: DC | PRN
  Filled 2020-07-11: qty 5

## 2020-07-11 MED ORDER — SODIUM CHLORIDE 0.9% FLUSH
3.0000 mL | Freq: Once | INTRAVENOUS | Status: DC | PRN
  Filled 2020-07-11: qty 3

## 2020-07-11 MED ORDER — IRON SUCROSE 20 MG/ML IV SOLN: 200.0000 mg | Freq: Once | INTRAVENOUS | Status: DC

## 2020-07-11 MED ORDER — POTASSIUM CHLORIDE CRYS ER 20 MEQ PO TBCR: 20.0000 meq | EXTENDED_RELEASE_TABLET | Freq: Two times a day (BID) | ORAL | 0 refills | Status: DC

## 2020-07-11 MED ORDER — SODIUM CHLORIDE 0.9% FLUSH
10.0000 mL | Freq: Once | INTRAVENOUS | Status: DC | PRN
  Filled 2020-07-11: qty 10

## 2020-07-11 NOTE — Progress Notes (Signed)
Pt very restless during his 2 hours of IV potassium. Not happy about being here 2 hours states he "may not be able to stay for the whole thing", I didn't realize it would take so long." Ambulates to bathroom with his cane and assistance of nurse. Discharged from clinic at [facility]. Pt states, "I got to go and be home before dark."

## 2020-07-12 ENCOUNTER — Inpatient Hospital Stay: Payer: Medicare Other

## 2020-07-12 ENCOUNTER — Encounter: Payer: Self-pay | Admitting: Oncology

## 2020-07-12 ENCOUNTER — Inpatient Hospital Stay (HOSPITAL_BASED_OUTPATIENT_CLINIC_OR_DEPARTMENT_OTHER): Payer: Medicare Other | Admitting: Oncology

## 2020-07-12 VITALS — BP 109/61 | HR 59

## 2020-07-12 VITALS — BP 109/58 | HR 54 | Temp 96.6°F | Resp 18 | Wt 228.4 lb

## 2020-07-12 DIAGNOSIS — D500 Iron deficiency anemia secondary to blood loss (chronic): Secondary | ICD-10-CM

## 2020-07-12 DIAGNOSIS — C61 Malignant neoplasm of prostate: Secondary | ICD-10-CM

## 2020-07-12 DIAGNOSIS — E876 Hypokalemia: Secondary | ICD-10-CM

## 2020-07-12 DIAGNOSIS — Z79899 Other long term (current) drug therapy: Secondary | ICD-10-CM | POA: Diagnosis not present

## 2020-07-12 DIAGNOSIS — D509 Iron deficiency anemia, unspecified: Secondary | ICD-10-CM | POA: Diagnosis not present

## 2020-07-12 MED ORDER — SODIUM CHLORIDE 0.9 % IV SOLN
Freq: Once | INTRAVENOUS | Status: AC
  Filled 2020-07-12: qty 250

## 2020-07-12 MED ORDER — IRON SUCROSE 20 MG/ML IV SOLN
200.0000 mg | Freq: Once | INTRAVENOUS | Status: AC
  Administered 2020-07-12: 200 mg via INTRAVENOUS
  Filled 2020-07-12: qty 10

## 2020-07-12 NOTE — Progress Notes (Signed)
Patient reports that since receiving IV potassium yesterday he is feeling more weak, fatigue, and increased hip pain.

## 2020-07-12 NOTE — Progress Notes (Signed)
Hematology/Oncology  [facility] Telephone:([phone] [phone]   Patient Care Team: Deland Pretty, MD as PCP - General (Internal Medicine) Stanford Breed Denice Bors, MD as PCP - Cardiology (Cardiology) Deland Pretty, MD (Internal Medicine) Earlie Server, MD as Referring Physician (Oncology)  REFERRING PROVIDER: Dr.Chrystal CHIEF COMPLAINTS/REASON FOR VISIT:  Follow up for prostate cancer and anemia  HISTORY OF PRESENTING ILLNESS:  Evan Dorsey is a  87 y.o.  male with PMH listed below who was referred to me for evaluation of anemia Patient was seen by radiation oncology Dr. Baruch Gouty for management of prostate cancer. 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  He was noted to have rising PSA in 2015 up to 10.9, he underwent transrectal ultrasound-guided biopsy showing a 7 of 12 cores positive for Gleason 7 (4+3)adenocarcinoma. Patient decided on active surveillance.  PSA has been rising most recently in August 2019 which was 17.2.  This prompted another trans-rectal ultrasound-guided biopsy in September 2019 and at this time with 10 out of 12 cores positive for Gleason 8 (4+4). Patient is on Eliquis for anticoagulation for atrial fibrillation had significant bleeding from prostate biopsy.  Bone scan on 03/25/2018 showed negative for osseous metastasis. 03/25/2018 CT abdomen pelvis showed no findings highly suspicious for metastatic disease in the abdomen or pelvis.  Scattered small low-attenuation liver mass are indeterminate in density and appear stable since Oct 19, 2016 CT.  More likely benign.  Mild to moderate prostato megaly Patient has been seen by medical oncology in [place] with recommendation of external beam radiation therapy.  Patient finished radiation.  Long term ADT Dr.Ottelin per Dr.Manning's note.   He noticed that patient's hemoglobin progressively worsened.   He was referred to hematology for further management. Reviewed patient's recent labs on 06/16/2018 revealed anemia with hemoglobin of 7.4 with MCV of 76. Reviewed patient's previous labs ordered by primary care physician's office, anemia is acute onset, duration is since September 2019 when he had excessive bleeding after prostate biopsy.   INTERVAL HISTORY [name] is a 87 y.o. male who has above history reviewed by me today presents for follow up of iron deficiency anemia.  Patient has previously received IV iron treatments Patient was last seen by me in March 2021 and at that point, patient prefers not to continue follow-up with our office. Patient was referred back for further evaluation of iron deficiency anemia. Patient reports that he recently recovered from pneumonia.  Not feeling well.  Still weak.  Accompanied by wife.  Patient is a poor historian  Review of Systems  Constitutional: Positive for fatigue. Negative for appetite change, chills, fever and unexpected weight change.  HENT:   Positive for hearing loss. Negative for voice change.   Eyes: Negative for eye problems and icterus.  Respiratory: Negative for chest tightness, cough and shortness of breath.   Cardiovascular: Negative for chest pain and leg swelling.  Gastrointestinal: Negative for abdominal distention and abdominal pain.  Endocrine: Negative for hot flashes.  Genitourinary: Negative for difficulty urinating, dysuria and frequency.   Musculoskeletal: Negative for arthralgias.  Skin: Negative for itching and rash.  Neurological: Negative for light-headedness and numbness.  Hematological: Negative for adenopathy. Does not bruise/bleed easily.  Psychiatric/Behavioral: Negative for confusion.   MEDICAL HISTORY:  Past Medical History:  Diagnosis Date  . Adenomatous polyp   . Arthritis    "left shoulder" (03/02/2013)  . Atrial fibrillation ([place])   .  Bladder cancer ([place]) 07/31/2011  . CAD (coronary artery  disease)    a. remote CABG b. prior cardiac cath 2005 c. Lexiscan Myoview 07/08/12 w/o evidence of ischemia, EF 53%  . Diverticulosis   . Elevated PSA   . Frequent urination    "day and night" (03/02/2013)  . GERD (gastroesophageal reflux disease)   . Gout    "been a long time ago" (03/02/2013)  . History of kidney stones   . HLD (hyperlipidemia)   . HTN (hypertension)   . Iron deficiency anemia due to chronic blood loss 06/29/2018  . Myocardial infarction ([place]) 1990's   silent  . Prostate cancer [facility])     SURGICAL HISTORY: Past Surgical History:  Procedure Laterality Date  . CARDIAC CATHETERIZATION     total occlusion to LAD, LCX, RCA. LIMA patent, normal seq SVG to OM, severe stenosis in SVG to PDA, but collaterals to PLA from OM. RCA went to PDA. treated medically   . CARDIOVASCULAR STRESS TEST  02/10/08   6 mets, poor tolerance, no chest pain   . CATARACT EXTRACTION W/ INTRAOCULAR LENS IMPLANT Right 2014  . CORONARY ARTERY BYPASS GRAFT  08/1991   "CABG X4" (03/02/2013)  . CYSTOSCOPY W/ STONE MANIPULATION    . ORIF ACETABULAR FRACTURE Left 04/2007   trimalleolar fracture  . TOTAL SHOULDER ARTHROPLASTY Left 03/02/2013  . TOTAL SHOULDER ARTHROPLASTY Left 03/02/2013   Procedure: LEFT TOTAL SHOULDER ARTHROPLASTY;  Surgeon: Marin Shutter, MD;  Location: [place];  Service: Orthopedics;  Laterality: Left;  . TRANSURETHRAL RESECTION OF BLADDER TUMOR  07/31/2011   Procedure: TRANSURETHRAL RESECTION OF BLADDER TUMOR (TURBT);  Surgeon: Claybon Jabs, MD;  Location[facility];  Service: Urology;  Laterality: N/A;  with Gyrus  . URETEROSCOPY WITH HOLMIUM LASER LITHOTRIPSY Right 11/06/2016   Procedure: URETEROSCOPY WITH HOLMIUM LASER LITHOTRIPSY/ RETROGRADE PYELOGRAM/ STENT;  Surgeon: Kathie Rhodes, MD;  Location: [facility];  Service: Urology;  Laterality: Right;    SOCIAL HISTORY: Social History   Socioeconomic History  . Marital status: Married    Spouse name: Not on file  .  Number of children: 3  . Years of education: Not on file  . Highest education level: Not on file  Occupational History  . Occupation: retired Psychologist, sport and exercise  Tobacco Use  . Smoking status: Never Smoker  . Smokeless tobacco: Never Used  Vaping Use  . Vaping Use: Never used  Substance and Sexual Activity  . Alcohol use: No  . Drug use: No  . Sexual activity: Not Currently  Other Topics Concern  . Not on file  Social History Narrative   Lives at home with wife. Retired Psychologist, sport and exercise.    Social Determinants of Health   Financial Resource Strain: Not on file  Food Insecurity: Not on file  Transportation Needs: Not on file  Physical Activity: Not on file  Stress: Not on file  Social Connections: Not on file  Intimate Partner Violence: Not on file    FAMILY HISTORY: Family History  Problem Relation Age of Onset  . CAD Father 70  . Prostate cancer Brother   . CAD Brother 62  . CAD Brother 16  . Breast cancer Neg Hx   . Colon cancer Neg Hx   . Pancreatic cancer Neg Hx     ALLERGIES:  has No Known Allergies.  MEDICATIONS:  Current Outpatient Medications  Medication Sig Dispense Refill  . acetaminophen (TYLENOL) 500 MG tablet Take 500 mg by mouth every 6 (six) hours  as needed.    Marland Kitchen amiodarone (PACERONE) 200 MG tablet TAKE ONE TABLET EVERY DAY 30 tablet 2  . amLODipine (NORVASC) 5 MG tablet TAKE ONE TABLET BY MOUTH EVERY DAY 90 tablet 2  . calcium carbonate (TUMS - DOSED IN MG ELEMENTAL CALCIUM) 500 MG chewable tablet Chew 2 tablets by mouth daily.    . Calcium Polycarbophil (FIBER) 625 MG TABS 1 tablet as needed    . Cholecalciferol 50 MCG (2000 UT) CAPS     . clobetasol cream (TEMOVATE) 6.43 % 1 application to affected area    . docusate sodium (COLACE) 100 MG capsule Take 1 capsule (100 mg total) by mouth daily. Do not take if you have loose stools 30 capsule 3  . ELIQUIS 5 MG TABS tablet TAKE ONE TABLET BY MOUTH TWICE DAILY 180 tablet 1  . ferrous sulfate 300 (60 Fe) MG/5ML syrup See  admin instructions.    . Leuprolide Acetate 5 MG/ML KIT 0.2 ml    . nitroGLYCERIN (NITROSTAT) 0.4 MG SL tablet PLACE 1 TABLET UNDER THE TONGUE EVERY 5 MINUTES IF NEEDED FOR CHEST PAIN. MAX = 3 TABS/EPISODE. 25 tablet 4  . pantoprazole (PROTONIX) 40 MG tablet Take 40 mg by mouth 2 (two) times daily.    . potassium chloride SA (KLOR-CON) 20 MEQ tablet Take 1 tablet (20 mEq total) by mouth 2 (two) times daily for 7 days. 14 tablet 0  . psyllium (HYDROCIL/METAMUCIL) 95 % PACK Take 1 packet by mouth daily. 240 each 0  . rosuvastatin (CRESTOR) 40 MG tablet Take 40 mg by mouth daily.  3  . triamcinolone (KENALOG) 0.1 % 1 application to affected area    . benzonatate (TESSALON) 100 MG capsule 1 capsule as needed    . cholecalciferol (VITAMIN D) 1000 units tablet Take 1,000 Units by mouth daily. (Patient not taking: No sig reported)     No current facility-administered medications for this visit.     PHYSICAL EXAMINATION: ECOG PERFORMANCE STATUS: 1 - Symptomatic but completely ambulatory Vitals:   07/12/20 1403  BP: (!) 109/58  Pulse: (!) 54  Resp: 18  Temp: (!) 96.6 F (35.9 C)   Filed Weights   07/12/20 1403  Weight: 228 lb 6.4 oz (103.6 kg)    Physical Exam Constitutional:      General: He is not in acute distress. HENT:     Head: Normocephalic and atraumatic.  Eyes:     General: No scleral icterus.    Pupils: Pupils are equal, round, and reactive to light.  Cardiovascular:     Rate and Rhythm: Normal rate and regular rhythm.     Heart sounds: Normal heart sounds.  Pulmonary:     Effort: Pulmonary effort is normal. No respiratory distress.     Breath sounds: No wheezing.     Comments: Decreased breath sound bilaterally Abdominal:     General: Bowel sounds are normal. There is no distension.     Palpations: Abdomen is soft. There is no mass.     Tenderness: There is no abdominal tenderness.  Musculoskeletal:        General: No deformity. Normal range of motion.      Cervical back: Normal range of motion and neck supple.  Skin:    General: Skin is warm and dry.     Coloration: Skin is pale.     Findings: No erythema or rash.  Neurological:     Mental Status: He is alert and oriented to person, place, and time.  Cranial Nerves: No cranial nerve deficit.     Coordination: Coordination normal.  Psychiatric:        Mood and Affect: Mood normal.      LABORATORY DATA:  I have reviewed the data as listed Lab Results  Component Value Date   WBC 7.3 07/11/2020   HGB 9.0 (L) 07/11/2020   HCT 30.9 (L) 07/11/2020   MCV 85.6 07/11/2020   PLT 261 07/11/2020   Recent Labs    07/11/20 1134  NA 138  K 2.7*  CL 107  CO2 23  GLUCOSE 93  BUN 14  CREATININE 1.52*  CALCIUM 8.7*  GFRNONAA 45*  PROT 6.3*  ALBUMIN 2.9*  AST 88*  ALT 52*  ALKPHOS 89  BILITOT 0.5   Iron/TIBC/Ferritin/ %Sat    Component Value Date/Time   IRON 140 07/11/2020 1134   TIBC 364 07/11/2020 1134   FERRITIN 11 (L) 07/11/2020 1134   IRONPCTSAT 39 07/11/2020 1134     RADIOGRAPHIC STUDIES: I have personally reviewed the radiological images as listed and agreed with the findings in the report. 05/17/2018 CT angio chest IMPRESSION: 1. No overt aneurysmal disease of the ascending thoracic aorta by CTA measurement. The ascending aorta is top normal in caliber and measures 3.9 cm in greatest diameter. 2. Normal variant separate origin of the left vertebral artery off of the aortic arch. Bone scan on 03/25/2018 showed negative for osseous metastasis. 03/25/2018 CT abdomen pelvis showed no findings highly suspicious for metastatic disease in the abdomen or pelvis.  Scattered small low-attenuation liver mass are indeterminate in density and appear stable since Oct 19, 2016 CT.  More likely benign.  Mild to moderate prostato megaly   ASSESSMENT & PLAN:  1. Iron deficiency anemia due to chronic blood loss   2. Prostate cancer ([place])   3. Hypokalemia    #Iron deficiency  anemia Labs are reviewed and discussed with patient.  Consistent with iron deficiency. Patient has been on oral iron supplementation.  Recommend patient to proceed with IV Venofer treatment weekly x4.He has previously had IV Venofer treatment tolerated well. Etiology of iron deficiency, unknown.  I recommend patient to follow-up with his gastroenterology to see if he needs to have a colonoscopy done for further evaluation.  # prostate caner, he has finished radiation and is managed by Urology.   Hypokalemia, potassium 2.7, patient was given IV potassium chloride 20 mEq yesterday and has started on potassium chloride 20 mg twice daily for 1 week.  He will repeat a BMP in 1 week.  all questions were answered. The patient knows to call the clinic with any problems questions or concerns.   Earlie Server, MD, PhD

## 2020-07-12 NOTE — Progress Notes (Signed)
Patient received prescribed treatment in clinic. Tolerated well. Patient stable at discharge. 

## 2020-07-14 DIAGNOSIS — I10 Essential (primary) hypertension: Secondary | ICD-10-CM | POA: Diagnosis not present

## 2020-07-14 DIAGNOSIS — I4510 Unspecified right bundle-branch block: Secondary | ICD-10-CM | POA: Diagnosis not present

## 2020-07-14 DIAGNOSIS — I4891 Unspecified atrial fibrillation: Secondary | ICD-10-CM | POA: Diagnosis not present

## 2020-07-14 DIAGNOSIS — I517 Cardiomegaly: Secondary | ICD-10-CM | POA: Diagnosis not present

## 2020-07-14 DIAGNOSIS — R52 Pain, unspecified: Secondary | ICD-10-CM | POA: Diagnosis not present

## 2020-07-14 DIAGNOSIS — Z20822 Contact with and (suspected) exposure to covid-19: Secondary | ICD-10-CM | POA: Diagnosis not present

## 2020-07-14 DIAGNOSIS — K8020 Calculus of gallbladder without cholecystitis without obstruction: Secondary | ICD-10-CM | POA: Diagnosis not present

## 2020-07-14 DIAGNOSIS — I491 Atrial premature depolarization: Secondary | ICD-10-CM | POA: Diagnosis not present

## 2020-07-14 DIAGNOSIS — I700 Atherosclerosis of aorta: Secondary | ICD-10-CM | POA: Diagnosis not present

## 2020-07-14 DIAGNOSIS — E7800 Pure hypercholesterolemia, unspecified: Secondary | ICD-10-CM | POA: Diagnosis not present

## 2020-07-14 DIAGNOSIS — K7460 Unspecified cirrhosis of liver: Secondary | ICD-10-CM | POA: Diagnosis not present

## 2020-07-14 DIAGNOSIS — K429 Umbilical hernia without obstruction or gangrene: Secondary | ICD-10-CM | POA: Diagnosis not present

## 2020-07-14 DIAGNOSIS — I959 Hypotension, unspecified: Secondary | ICD-10-CM | POA: Diagnosis not present

## 2020-07-14 DIAGNOSIS — K4020 Bilateral inguinal hernia, without obstruction or gangrene, not specified as recurrent: Secondary | ICD-10-CM | POA: Diagnosis not present

## 2020-07-14 DIAGNOSIS — M549 Dorsalgia, unspecified: Secondary | ICD-10-CM | POA: Diagnosis not present

## 2020-07-14 DIAGNOSIS — R001 Bradycardia, unspecified: Secondary | ICD-10-CM | POA: Diagnosis not present

## 2020-07-14 DIAGNOSIS — E785 Hyperlipidemia, unspecified: Secondary | ICD-10-CM | POA: Diagnosis not present

## 2020-07-14 DIAGNOSIS — M5416 Radiculopathy, lumbar region: Secondary | ICD-10-CM | POA: Diagnosis not present

## 2020-07-16 NOTE — Progress Notes (Deleted)
HPI: Followup coronary artery disease status post coronary artery bypassing graft and atrial fibrillation. Patient had previous coronary artery bypassing graft in 1993. Last cardiac catheterization in 2005 showed occluded LAD, circumflex and right coronary artery. LIMA to the LAD was patent, saphenous vein graft to obtuse marginal patent. The saphenous vein graft to the PDA had a severe stenosis but there were collaterals from the obtuse marginal and PLA. Medical therapy recommended. Nuclear study7/17 showed EF 59 and no ischemia.CTA December 2019 showed mildly dilated aortic root at 3.9 cm. Echocardiogram 8/21 showed normal LV function, moderate LVH, grade 1 DD, mild LAE, mild aortic stenosis and mildly dilated aortic root (44 mm). Since he was last seen,   Current Outpatient Medications  Medication Sig Dispense Refill  . acetaminophen (TYLENOL) 500 MG tablet Take 500 mg by mouth every 6 (six) hours as needed.    Marland Kitchen amiodarone (PACERONE) 200 MG tablet TAKE ONE TABLET EVERY DAY 30 tablet 2  . amLODipine (NORVASC) 5 MG tablet TAKE ONE TABLET BY MOUTH EVERY DAY 90 tablet 2  . calcium carbonate (TUMS - DOSED IN MG ELEMENTAL CALCIUM) 500 MG chewable tablet Chew 2 tablets by mouth daily.    . Calcium Polycarbophil (FIBER) 625 MG TABS 1 tablet as needed    . cholecalciferol (VITAMIN D) 1000 units tablet Take 1,000 Units by mouth daily. (Patient not taking: No sig reported)    . Cholecalciferol 50 MCG (2000 UT) CAPS     . clobetasol cream (TEMOVATE) 4.85 % 1 application to affected area    . docusate sodium (COLACE) 100 MG capsule Take 1 capsule (100 mg total) by mouth daily. Do not take if you have loose stools 30 capsule 3  . ELIQUIS 5 MG TABS tablet TAKE ONE TABLET BY MOUTH TWICE DAILY 180 tablet 1  . ferrous sulfate 300 (60 Fe) MG/5ML syrup See admin instructions.    . Leuprolide Acetate 5 MG/ML KIT 0.2 ml    . nitroGLYCERIN (NITROSTAT) 0.4 MG SL tablet PLACE 1 TABLET UNDER THE TONGUE EVERY  5 MINUTES IF NEEDED FOR CHEST PAIN. MAX = 3 TABS/EPISODE. 25 tablet 4  . pantoprazole (PROTONIX) 40 MG tablet Take 40 mg by mouth 2 (two) times daily.    . potassium chloride SA (KLOR-CON) 20 MEQ tablet Take 1 tablet (20 mEq total) by mouth 2 (two) times daily for 7 days. 14 tablet 0  . psyllium (HYDROCIL/METAMUCIL) 95 % PACK Take 1 packet by mouth daily. 240 each 0  . rosuvastatin (CRESTOR) 40 MG tablet Take 40 mg by mouth daily.  3  . triamcinolone (KENALOG) 0.1 % 1 application to affected area     No current facility-administered medications for this visit.     Past Medical History:  Diagnosis Date  . Adenomatous polyp   . Arthritis    "left shoulder" (03/02/2013)  . Atrial fibrillation ([place])   . Bladder cancer ([place]) 07/31/2011  . CAD (coronary artery disease)    a. remote CABG b. prior cardiac cath 2005 c. Lexiscan Myoview 07/08/12 w/o evidence of ischemia, EF 53%  . Diverticulosis   . Elevated PSA   . Frequent urination    "day and night" (03/02/2013)  . GERD (gastroesophageal reflux disease)   . Gout    "been a long time ago" (03/02/2013)  . History of kidney stones   . HLD (hyperlipidemia)   . HTN (hypertension)   . Iron deficiency anemia due to chronic blood loss 06/29/2018  . Myocardial  infarction ([place]) 1990's   silent  . Prostate cancer [facility])     Past Surgical History:  Procedure Laterality Date  . CARDIAC CATHETERIZATION     total occlusion to LAD, LCX, RCA. LIMA patent, normal seq SVG to OM, severe stenosis in SVG to PDA, but collaterals to PLA from OM. RCA went to PDA. treated medically   . CARDIOVASCULAR STRESS TEST  02/10/08   6 mets, poor tolerance, no chest pain   . CATARACT EXTRACTION W/ INTRAOCULAR LENS IMPLANT Right 2014  . CORONARY ARTERY BYPASS GRAFT  08/1991   "CABG X4" (03/02/2013)  . CYSTOSCOPY W/ STONE MANIPULATION    . ORIF ACETABULAR FRACTURE Left 04/2007   trimalleolar fracture  . TOTAL SHOULDER ARTHROPLASTY Left 03/02/2013  . TOTAL SHOULDER  ARTHROPLASTY Left 03/02/2013   Procedure: LEFT TOTAL SHOULDER ARTHROPLASTY;  Surgeon: Marin Shutter, MD;  Location: [place];  Service: Orthopedics;  Laterality: Left;  . TRANSURETHRAL RESECTION OF BLADDER TUMOR  07/31/2011   Procedure: TRANSURETHRAL RESECTION OF BLADDER TUMOR (TURBT);  Surgeon: Claybon Jabs, MD;  [facility];  Service: Urology;  Laterality: N/A;  with Gyrus  . URETEROSCOPY WITH HOLMIUM LASER LITHOTRIPSY Right 11/06/2016   Procedure: URETEROSCOPY WITH HOLMIUM LASER LITHOTRIPSY/ RETROGRADE PYELOGRAM/ STENT;  Surgeon: Kathie Rhodes, MD;  Location: [facility];  Service: Urology;  Laterality: Right;    Social History   Socioeconomic History  . Marital status: Married    Spouse name: Not on file  . Number of children: 3  . Years of education: Not on file  . Highest education level: Not on file  Occupational History  . Occupation: retired Psychologist, sport and exercise  Tobacco Use  . Smoking status: Never Smoker  . Smokeless tobacco: Never Used  Vaping Use  . Vaping Use: Never used  Substance and Sexual Activity  . Alcohol use: No  . Drug use: No  . Sexual activity: Not Currently  Other Topics Concern  . Not on file  Social History Narrative   Lives at home with wife. Retired Psychologist, sport and exercise.    Social Determinants of Health   Financial Resource Strain: Not on file  Food Insecurity: Not on file  Transportation Needs: Not on file  Physical Activity: Not on file  Stress: Not on file  Social Connections: Not on file  Intimate Partner Violence: Not on file    Family History  Problem Relation Age of Onset  . CAD Father 61  . Prostate cancer Brother   . CAD Brother 12  . CAD Brother 52  . Breast cancer Neg Hx   . Colon cancer Neg Hx   . Pancreatic cancer Neg Hx     ROS: no fevers or chills, productive cough, hemoptysis, dysphasia, odynophagia, melena, hematochezia, dysuria, hematuria, rash, seizure activity, orthopnea, PND, pedal edema, claudication. Remaining systems are  negative.  Physical Exam: Well-developed well-nourished in no acute distress.  Skin is warm and dry.  HEENT is normal.  Neck is supple.  Chest is clear to auscultation with normal expansion.  Cardiovascular exam is regular rate and rhythm.  Abdominal exam nontender or distended. No masses palpated. Extremities show no edema. neuro grossly intact  ECG- personally reviewed  A/P  1 coronary artery disease-patient denies chest pain.  Continue medical therapy with statin.  No aspirin given need for anticoagulation.  2 paroxysmal atrial fibrillation-patient remains in sinus rhythm.  Continue amiodarone and apixaban.  3 hypertension-patient's blood pressure is controlled today.  Continue present medications and follow.  4 hyperlipidemia-continue  statin.  5 mild aortic stenosis-patient will need follow-up echoes in the future.  6 dilated aortic root-previous CT scan showed 3.9 cm.  7 chronic diastolic congestive heart failure-she is euvolemic.  Continue with fluid restriction and low-sodium diet.  [name], MD

## 2020-07-17 DIAGNOSIS — I4891 Unspecified atrial fibrillation: Secondary | ICD-10-CM | POA: Diagnosis not present

## 2020-07-17 DIAGNOSIS — I10 Essential (primary) hypertension: Secondary | ICD-10-CM | POA: Diagnosis not present

## 2020-07-17 DIAGNOSIS — I2510 Atherosclerotic heart disease of native coronary artery without angina pectoris: Secondary | ICD-10-CM | POA: Diagnosis not present

## 2020-07-17 DIAGNOSIS — G8929 Other chronic pain: Secondary | ICD-10-CM | POA: Diagnosis not present

## 2020-07-17 DIAGNOSIS — J189 Pneumonia, unspecified organism: Secondary | ICD-10-CM | POA: Diagnosis not present

## 2020-07-17 DIAGNOSIS — M545 Low back pain, unspecified: Secondary | ICD-10-CM | POA: Diagnosis not present

## 2020-07-18 ENCOUNTER — Inpatient Hospital Stay: Payer: Medicare Other

## 2020-07-18 ENCOUNTER — Other Ambulatory Visit: Payer: Self-pay | Admitting: Oncology

## 2020-07-18 ENCOUNTER — Inpatient Hospital Stay: Payer: Medicare Other | Attending: Oncology

## 2020-07-18 VITALS — BP 115/56 | HR 59 | Temp 97.5°F | Resp 20

## 2020-07-18 DIAGNOSIS — D500 Iron deficiency anemia secondary to blood loss (chronic): Secondary | ICD-10-CM

## 2020-07-18 DIAGNOSIS — D509 Iron deficiency anemia, unspecified: Secondary | ICD-10-CM | POA: Diagnosis not present

## 2020-07-18 DIAGNOSIS — C61 Malignant neoplasm of prostate: Secondary | ICD-10-CM | POA: Diagnosis not present

## 2020-07-18 LAB — BASIC METABOLIC PANEL
Anion gap: 7 (ref 5–15)
BUN: 20 mg/dL (ref 8–23)
CO2: 19 mmol/L — ABNORMAL LOW (ref 22–32)
Calcium: 9 mg/dL (ref 8.9–10.3)
Chloride: 113 mmol/L — ABNORMAL HIGH (ref 98–111)
Creatinine, Ser: 2.14 mg/dL — ABNORMAL HIGH (ref 0.61–1.24)
GFR, Estimated: 30 mL/min — ABNORMAL LOW (ref >60)
Glucose, Bld: 89 mg/dL (ref 70–99)
Potassium: 4.4 mmol/L (ref 3.5–5.1)
Sodium: 139 mmol/L (ref 135–145)

## 2020-07-18 MED ORDER — SODIUM CHLORIDE 0.9 % IV SOLN
Freq: Once | INTRAVENOUS | Status: DC
  Filled 2020-07-18: qty 250

## 2020-07-18 MED ORDER — IRON SUCROSE 20 MG/ML IV SOLN
200.0000 mg | Freq: Once | INTRAVENOUS | Status: AC
  Administered 2020-07-18: 200 mg via INTRAVENOUS
  Filled 2020-07-18: qty 10

## 2020-07-18 MED ORDER — SODIUM CHLORIDE 0.9 % IV SOLN
Freq: Once | INTRAVENOUS | Status: AC
  Administered 2020-07-18: 500 mL via INTRAVENOUS
  Filled 2020-07-18: qty 250

## 2020-07-18 MED ORDER — SODIUM CHLORIDE 0.9 % IV SOLN
Freq: Once | INTRAVENOUS | Status: AC
  Filled 2020-07-18: qty 250

## 2020-07-18 NOTE — Progress Notes (Signed)
1348- Patient here for Venofer infusion today. Basic Metabolic Panel results reviewed with MD, Dr. Tasia Catchings. Creatinine: 2.14 today. Per MD order: proceed with scheduled Venofer infusion at this time.  1406- Patient reports having a very rough week. Patient states, "I went to the hospital over the weekend and they found I have a pinched nerve. I haven't been able to get around good since then. I have been very weak this week. Therapy has came out to the house to work with me." Patient reports right lower back pain, pain level 4 on a 0-10 pain scale. Patient states, "The pain is alright and pretty decent right now. I just don't have any strength." Patient reports he has not been eating and drinking very well this week. MD, Dr. Tasia Catchings, notified and aware. Per MD order: administer 578ml 0.9% Sodium Chloride infusion over 1 hour (564ml/hr); MD placing order in supportive therapy plan, see MAR.  1543- Patient tolerated Venofer infusion and 0.9% Sodium Chloride infusion well. Patient and vital signs stable. Patient discharged to home at this time.

## 2020-07-20 ENCOUNTER — Inpatient Hospital Stay (HOSPITAL_COMMUNITY)
Admission: EM | Admit: 2020-07-20 | Discharge: 2020-07-25 | DRG: 291 | Disposition: A | Discharge status: Home Health | Payer: Medicare Other | Attending: Internal Medicine | Admitting: Internal Medicine

## 2020-07-20 ENCOUNTER — Encounter (HOSPITAL_COMMUNITY): Payer: Self-pay | Admitting: Emergency Medicine

## 2020-07-20 ENCOUNTER — Other Ambulatory Visit: Payer: Self-pay

## 2020-07-20 DIAGNOSIS — I25810 Atherosclerosis of coronary artery bypass graft(s) without angina pectoris: Secondary | ICD-10-CM | POA: Diagnosis present

## 2020-07-20 DIAGNOSIS — N400 Enlarged prostate without lower urinary tract symptoms: Secondary | ICD-10-CM | POA: Diagnosis present

## 2020-07-20 DIAGNOSIS — E669 Obesity, unspecified: Secondary | ICD-10-CM | POA: Diagnosis present

## 2020-07-20 DIAGNOSIS — I5033 Acute on chronic diastolic (congestive) heart failure: Secondary | ICD-10-CM | POA: Diagnosis present

## 2020-07-20 DIAGNOSIS — E876 Hypokalemia: Secondary | ICD-10-CM | POA: Diagnosis present

## 2020-07-20 DIAGNOSIS — Z8701 Personal history of pneumonia (recurrent): Secondary | ICD-10-CM

## 2020-07-20 DIAGNOSIS — Z8042 Family history of malignant neoplasm of prostate: Secondary | ICD-10-CM

## 2020-07-20 DIAGNOSIS — Z961 Presence of intraocular lens: Secondary | ICD-10-CM | POA: Diagnosis present

## 2020-07-20 DIAGNOSIS — Z79899 Other long term (current) drug therapy: Secondary | ICD-10-CM

## 2020-07-20 DIAGNOSIS — D631 Anemia in chronic kidney disease: Secondary | ICD-10-CM | POA: Diagnosis present

## 2020-07-20 DIAGNOSIS — I2582 Chronic total occlusion of coronary artery: Secondary | ICD-10-CM | POA: Diagnosis present

## 2020-07-20 DIAGNOSIS — N179 Acute kidney failure, unspecified: Secondary | ICD-10-CM | POA: Diagnosis present

## 2020-07-20 DIAGNOSIS — D6859 Other primary thrombophilia: Secondary | ICD-10-CM | POA: Diagnosis present

## 2020-07-20 DIAGNOSIS — I2510 Atherosclerotic heart disease of native coronary artery without angina pectoris: Secondary | ICD-10-CM | POA: Diagnosis present

## 2020-07-20 DIAGNOSIS — Z20822 Contact with and (suspected) exposure to covid-19: Secondary | ICD-10-CM | POA: Diagnosis present

## 2020-07-20 DIAGNOSIS — Z6831 Body mass index (BMI) 31.0-31.9, adult: Secondary | ICD-10-CM

## 2020-07-20 DIAGNOSIS — T502X5A Adverse effect of carbonic-anhydrase inhibitors, benzothiadiazides and other diuretics, initial encounter: Secondary | ICD-10-CM | POA: Diagnosis not present

## 2020-07-20 DIAGNOSIS — Z87442 Personal history of urinary calculi: Secondary | ICD-10-CM

## 2020-07-20 DIAGNOSIS — R062 Wheezing: Secondary | ICD-10-CM | POA: Diagnosis not present

## 2020-07-20 DIAGNOSIS — E785 Hyperlipidemia, unspecified: Secondary | ICD-10-CM | POA: Diagnosis present

## 2020-07-20 DIAGNOSIS — Z8551 Personal history of malignant neoplasm of bladder: Secondary | ICD-10-CM

## 2020-07-20 DIAGNOSIS — R001 Bradycardia, unspecified: Secondary | ICD-10-CM | POA: Diagnosis present

## 2020-07-20 DIAGNOSIS — D6869 Other thrombophilia: Secondary | ICD-10-CM | POA: Diagnosis present

## 2020-07-20 DIAGNOSIS — R600 Localized edema: Secondary | ICD-10-CM | POA: Diagnosis not present

## 2020-07-20 DIAGNOSIS — B159 Hepatitis A without hepatic coma: Secondary | ICD-10-CM | POA: Diagnosis present

## 2020-07-20 DIAGNOSIS — I130 Hypertensive heart and chronic kidney disease with heart failure and stage 1 through stage 4 chronic kidney disease, or unspecified chronic kidney disease: Principal | ICD-10-CM | POA: Diagnosis present

## 2020-07-20 DIAGNOSIS — I252 Old myocardial infarction: Secondary | ICD-10-CM

## 2020-07-20 DIAGNOSIS — R918 Other nonspecific abnormal finding of lung field: Secondary | ICD-10-CM | POA: Diagnosis not present

## 2020-07-20 DIAGNOSIS — Z96612 Presence of left artificial shoulder joint: Secondary | ICD-10-CM | POA: Diagnosis present

## 2020-07-20 DIAGNOSIS — I509 Heart failure, unspecified: Secondary | ICD-10-CM

## 2020-07-20 DIAGNOSIS — R531 Weakness: Secondary | ICD-10-CM | POA: Diagnosis not present

## 2020-07-20 DIAGNOSIS — N1831 Chronic kidney disease, stage 3a: Secondary | ICD-10-CM | POA: Diagnosis present

## 2020-07-20 DIAGNOSIS — I480 Paroxysmal atrial fibrillation: Secondary | ICD-10-CM | POA: Diagnosis present

## 2020-07-20 DIAGNOSIS — E872 Acidosis: Secondary | ICD-10-CM | POA: Diagnosis present

## 2020-07-20 DIAGNOSIS — I472 Ventricular tachycardia: Secondary | ICD-10-CM | POA: Diagnosis present

## 2020-07-20 DIAGNOSIS — Z951 Presence of aortocoronary bypass graft: Secondary | ICD-10-CM

## 2020-07-20 DIAGNOSIS — K219 Gastro-esophageal reflux disease without esophagitis: Secondary | ICD-10-CM | POA: Diagnosis present

## 2020-07-20 DIAGNOSIS — N189 Chronic kidney disease, unspecified: Secondary | ICD-10-CM | POA: Diagnosis present

## 2020-07-20 DIAGNOSIS — I10 Essential (primary) hypertension: Secondary | ICD-10-CM | POA: Diagnosis present

## 2020-07-20 DIAGNOSIS — M545 Low back pain, unspecified: Secondary | ICD-10-CM | POA: Diagnosis present

## 2020-07-20 DIAGNOSIS — R9431 Abnormal electrocardiogram [ECG] [EKG]: Secondary | ICD-10-CM | POA: Diagnosis present

## 2020-07-20 DIAGNOSIS — M549 Dorsalgia, unspecified: Secondary | ICD-10-CM | POA: Diagnosis not present

## 2020-07-20 DIAGNOSIS — I4510 Unspecified right bundle-branch block: Secondary | ICD-10-CM | POA: Diagnosis present

## 2020-07-20 DIAGNOSIS — Z9841 Cataract extraction status, right eye: Secondary | ICD-10-CM

## 2020-07-20 DIAGNOSIS — Z8249 Family history of ischemic heart disease and other diseases of the circulatory system: Secondary | ICD-10-CM

## 2020-07-20 DIAGNOSIS — Z7901 Long term (current) use of anticoagulants: Secondary | ICD-10-CM

## 2020-07-20 DIAGNOSIS — D500 Iron deficiency anemia secondary to blood loss (chronic): Secondary | ICD-10-CM | POA: Diagnosis present

## 2020-07-20 DIAGNOSIS — G8929 Other chronic pain: Secondary | ICD-10-CM | POA: Diagnosis present

## 2020-07-20 DIAGNOSIS — C61 Malignant neoplasm of prostate: Secondary | ICD-10-CM | POA: Diagnosis present

## 2020-07-20 DIAGNOSIS — Z8546 Personal history of malignant neoplasm of prostate: Secondary | ICD-10-CM

## 2020-07-20 DIAGNOSIS — I4891 Unspecified atrial fibrillation: Secondary | ICD-10-CM | POA: Diagnosis present

## 2020-07-20 DIAGNOSIS — M19012 Primary osteoarthritis, left shoulder: Secondary | ICD-10-CM | POA: Diagnosis present

## 2020-07-20 DIAGNOSIS — R319 Hematuria, unspecified: Secondary | ICD-10-CM | POA: Diagnosis present

## 2020-07-20 DIAGNOSIS — Z906 Acquired absence of other parts of urinary tract: Secondary | ICD-10-CM

## 2020-07-20 LAB — URINALYSIS, ROUTINE W REFLEX MICROSCOPIC
Bilirubin Urine: NEGATIVE
Glucose, UA: NEGATIVE mg/dL
Ketones, ur: NEGATIVE mg/dL
Leukocytes,Ua: NEGATIVE
Nitrite: NEGATIVE
Protein, ur: 100 mg/dL — AB
Specific Gravity, Urine: 1.02 (ref 1.005–1.030)
pH: 5 (ref 5.0–8.0)

## 2020-07-20 LAB — CBC
HCT: 27.6 % — ABNORMAL LOW (ref 39.0–52.0)
Hemoglobin: 8 g/dL — ABNORMAL LOW (ref 13.0–17.0)
MCH: 26 pg (ref 26.0–34.0)
MCHC: 29 g/dL — ABNORMAL LOW (ref 30.0–36.0)
MCV: 89.6 fL (ref 80.0–100.0)
Platelets: 223 10*3/uL (ref 150–400)
RBC: 3.08 MIL/uL — ABNORMAL LOW (ref 4.22–5.81)
RDW: 25.2 % — ABNORMAL HIGH (ref 11.5–15.5)
WBC: 7.1 10*3/uL (ref 4.0–10.5)
nRBC: 0.3 % — ABNORMAL HIGH (ref 0.0–0.2)

## 2020-07-20 LAB — BASIC METABOLIC PANEL
Anion gap: 9 (ref 5–15)
BUN: 16 mg/dL (ref 8–23)
CO2: 17 mmol/L — ABNORMAL LOW (ref 22–32)
Calcium: 9.2 mg/dL (ref 8.9–10.3)
Chloride: 110 mmol/L (ref 98–111)
Creatinine, Ser: 2.12 mg/dL — ABNORMAL HIGH (ref 0.61–1.24)
GFR, Estimated: 30 mL/min — ABNORMAL LOW (ref >60)
Glucose, Bld: 112 mg/dL — ABNORMAL HIGH (ref 70–99)
Potassium: 3.9 mmol/L (ref 3.5–5.1)
Sodium: 136 mmol/L (ref 135–145)

## 2020-07-20 NOTE — ED Triage Notes (Addendum)
Pt reports R sided back pain x 1 week.  States he was seen in [facility] ER and diagnosed with a pinched nerve and pain has not improved.  Received iron infusion on Thursday.  Reports generalized weakness.  Pt HOH.  Wife with pt.

## 2020-07-21 ENCOUNTER — Emergency Department (HOSPITAL_COMMUNITY): Payer: Medicare Other

## 2020-07-21 DIAGNOSIS — C61 Malignant neoplasm of prostate: Secondary | ICD-10-CM | POA: Diagnosis not present

## 2020-07-21 DIAGNOSIS — Z951 Presence of aortocoronary bypass graft: Secondary | ICD-10-CM | POA: Diagnosis not present

## 2020-07-21 DIAGNOSIS — E785 Hyperlipidemia, unspecified: Secondary | ICD-10-CM | POA: Diagnosis present

## 2020-07-21 DIAGNOSIS — I10 Essential (primary) hypertension: Secondary | ICD-10-CM | POA: Diagnosis not present

## 2020-07-21 DIAGNOSIS — N183 Chronic kidney disease, stage 3 unspecified: Secondary | ICD-10-CM | POA: Diagnosis not present

## 2020-07-21 DIAGNOSIS — N179 Acute kidney failure, unspecified: Secondary | ICD-10-CM | POA: Diagnosis present

## 2020-07-21 DIAGNOSIS — D6859 Other primary thrombophilia: Secondary | ICD-10-CM | POA: Diagnosis present

## 2020-07-21 DIAGNOSIS — R600 Localized edema: Secondary | ICD-10-CM | POA: Diagnosis not present

## 2020-07-21 DIAGNOSIS — E876 Hypokalemia: Secondary | ICD-10-CM | POA: Diagnosis not present

## 2020-07-21 DIAGNOSIS — M549 Dorsalgia, unspecified: Secondary | ICD-10-CM | POA: Diagnosis not present

## 2020-07-21 DIAGNOSIS — B159 Hepatitis A without hepatic coma: Secondary | ICD-10-CM | POA: Diagnosis present

## 2020-07-21 DIAGNOSIS — R531 Weakness: Secondary | ICD-10-CM | POA: Diagnosis not present

## 2020-07-21 DIAGNOSIS — Z20822 Contact with and (suspected) exposure to covid-19: Secondary | ICD-10-CM | POA: Diagnosis present

## 2020-07-21 DIAGNOSIS — Z9841 Cataract extraction status, right eye: Secondary | ICD-10-CM | POA: Diagnosis not present

## 2020-07-21 DIAGNOSIS — N1831 Chronic kidney disease, stage 3a: Secondary | ICD-10-CM | POA: Diagnosis present

## 2020-07-21 DIAGNOSIS — Z961 Presence of intraocular lens: Secondary | ICD-10-CM | POA: Diagnosis present

## 2020-07-21 DIAGNOSIS — R7401 Elevation of levels of liver transaminase levels: Secondary | ICD-10-CM

## 2020-07-21 DIAGNOSIS — E872 Acidosis: Secondary | ICD-10-CM | POA: Diagnosis present

## 2020-07-21 DIAGNOSIS — G8929 Other chronic pain: Secondary | ICD-10-CM | POA: Diagnosis present

## 2020-07-21 DIAGNOSIS — M545 Low back pain, unspecified: Secondary | ICD-10-CM | POA: Diagnosis present

## 2020-07-21 DIAGNOSIS — J189 Pneumonia, unspecified organism: Secondary | ICD-10-CM | POA: Diagnosis not present

## 2020-07-21 DIAGNOSIS — I5033 Acute on chronic diastolic (congestive) heart failure: Secondary | ICD-10-CM | POA: Diagnosis present

## 2020-07-21 DIAGNOSIS — I25810 Atherosclerosis of coronary artery bypass graft(s) without angina pectoris: Secondary | ICD-10-CM | POA: Diagnosis not present

## 2020-07-21 DIAGNOSIS — E669 Obesity, unspecified: Secondary | ICD-10-CM | POA: Diagnosis present

## 2020-07-21 DIAGNOSIS — Z6831 Body mass index (BMI) 31.0-31.9, adult: Secondary | ICD-10-CM | POA: Diagnosis not present

## 2020-07-21 DIAGNOSIS — N189 Chronic kidney disease, unspecified: Secondary | ICD-10-CM | POA: Diagnosis present

## 2020-07-21 DIAGNOSIS — R9431 Abnormal electrocardiogram [ECG] [EKG]: Secondary | ICD-10-CM | POA: Diagnosis present

## 2020-07-21 DIAGNOSIS — I472 Ventricular tachycardia: Secondary | ICD-10-CM | POA: Diagnosis present

## 2020-07-21 DIAGNOSIS — R319 Hematuria, unspecified: Secondary | ICD-10-CM | POA: Diagnosis present

## 2020-07-21 DIAGNOSIS — I130 Hypertensive heart and chronic kidney disease with heart failure and stage 1 through stage 4 chronic kidney disease, or unspecified chronic kidney disease: Secondary | ICD-10-CM | POA: Diagnosis present

## 2020-07-21 DIAGNOSIS — I509 Heart failure, unspecified: Secondary | ICD-10-CM

## 2020-07-21 DIAGNOSIS — I2510 Atherosclerotic heart disease of native coronary artery without angina pectoris: Secondary | ICD-10-CM | POA: Diagnosis present

## 2020-07-21 DIAGNOSIS — I480 Paroxysmal atrial fibrillation: Secondary | ICD-10-CM | POA: Diagnosis present

## 2020-07-21 DIAGNOSIS — D500 Iron deficiency anemia secondary to blood loss (chronic): Secondary | ICD-10-CM | POA: Diagnosis present

## 2020-07-21 DIAGNOSIS — T502X5A Adverse effect of carbonic-anhydrase inhibitors, benzothiadiazides and other diuretics, initial encounter: Secondary | ICD-10-CM | POA: Diagnosis not present

## 2020-07-21 DIAGNOSIS — R918 Other nonspecific abnormal finding of lung field: Secondary | ICD-10-CM | POA: Diagnosis not present

## 2020-07-21 DIAGNOSIS — R062 Wheezing: Secondary | ICD-10-CM | POA: Diagnosis not present

## 2020-07-21 DIAGNOSIS — K219 Gastro-esophageal reflux disease without esophagitis: Secondary | ICD-10-CM | POA: Diagnosis present

## 2020-07-21 LAB — HEPATIC FUNCTION PANEL
ALT: 65 U/L — ABNORMAL HIGH (ref 0–44)
AST: 97 U/L — ABNORMAL HIGH (ref 15–41)
Albumin: 2.7 g/dL — ABNORMAL LOW (ref 3.5–5.0)
Alkaline Phosphatase: 95 U/L (ref 38–126)
Bilirubin, Direct: 0.2 mg/dL (ref 0.0–0.2)
Indirect Bilirubin: 0.7 mg/dL (ref 0.3–0.9)
Total Bilirubin: 0.9 mg/dL (ref 0.3–1.2)
Total Protein: 6.1 g/dL — ABNORMAL LOW (ref 6.5–8.1)

## 2020-07-21 LAB — CBC
HCT: 27.9 % — ABNORMAL LOW (ref 39.0–52.0)
Hemoglobin: 8.1 g/dL — ABNORMAL LOW (ref 13.0–17.0)
MCH: 26.2 pg (ref 26.0–34.0)
MCHC: 29 g/dL — ABNORMAL LOW (ref 30.0–36.0)
MCV: 90.3 fL (ref 80.0–100.0)
Platelets: 225 10*3/uL (ref 150–400)
RBC: 3.09 MIL/uL — ABNORMAL LOW (ref 4.22–5.81)
RDW: 25.3 % — ABNORMAL HIGH (ref 11.5–15.5)
WBC: 7.7 10*3/uL (ref 4.0–10.5)
nRBC: 0 % (ref 0.0–0.2)

## 2020-07-21 LAB — CBG MONITORING, ED: Glucose-Capillary: 113 mg/dL — ABNORMAL HIGH (ref 70–99)

## 2020-07-21 LAB — TYPE AND SCREEN
ABO/RH(D): O POS
Antibody Screen: NEGATIVE

## 2020-07-21 LAB — PROCALCITONIN: Procalcitonin: 0.23 ng/mL

## 2020-07-21 LAB — MAGNESIUM: Magnesium: 2.1 mg/dL (ref 1.7–2.4)

## 2020-07-21 LAB — SARS CORONAVIRUS 2 (TAT 6-24 HRS): SARS Coronavirus 2: NEGATIVE

## 2020-07-21 LAB — BRAIN NATRIURETIC PEPTIDE: B Natriuretic Peptide: 352.2 pg/mL — ABNORMAL HIGH (ref 0.0–100.0)

## 2020-07-21 LAB — TSH: TSH: 3.391 u[IU]/mL (ref 0.350–4.500)

## 2020-07-21 LAB — CK: Total CK: 276 U/L (ref 49–397)

## 2020-07-21 LAB — SODIUM, URINE, RANDOM: Sodium, Ur: <10 mmol/L

## 2020-07-21 LAB — CREATININE, URINE, RANDOM: Creatinine, Urine: 139.36 mg/dL

## 2020-07-21 LAB — TROPONIN I (HIGH SENSITIVITY)
Troponin I (High Sensitivity): 16 ng/L (ref <18)
Troponin I (High Sensitivity): 15 ng/L (ref <18)

## 2020-07-21 MED ORDER — AMIODARONE HCL 200 MG PO TABS
200.0000 mg | ORAL_TABLET | Freq: Every day | ORAL | Status: DC
  Administered 2020-07-22 – 2020-07-25 (×4): 200 mg via ORAL
  Filled 2020-07-21 (×5): qty 1

## 2020-07-21 MED ORDER — ONDANSETRON HCL 4 MG/2ML IJ SOLN: 4.0000 mg | Freq: Four times a day (QID) | INTRAMUSCULAR | Status: DC | PRN

## 2020-07-21 MED ORDER — ACETAMINOPHEN 325 MG PO TABS
650.0000 mg | ORAL_TABLET | ORAL | Status: DC | PRN
  Administered 2020-07-25: 650 mg via ORAL
  Filled 2020-07-21 (×2): qty 2

## 2020-07-21 MED ORDER — LIDOCAINE 5 % EX PTCH
1.0000 | MEDICATED_PATCH | CUTANEOUS | Status: DC
  Administered 2020-07-21 – 2020-07-24 (×4): 1 via TRANSDERMAL
  Filled 2020-07-21 (×4): qty 1

## 2020-07-21 MED ORDER — HYDROCODONE-ACETAMINOPHEN 5-325 MG PO TABS
1.0000 | ORAL_TABLET | Freq: Four times a day (QID) | ORAL | Status: DC | PRN
  Administered 2020-07-22 – 2020-07-25 (×7): 1 via ORAL
  Filled 2020-07-21 (×8): qty 1

## 2020-07-21 MED ORDER — ROSUVASTATIN CALCIUM 20 MG PO TABS
40.0000 mg | ORAL_TABLET | Freq: Every day | ORAL | Status: DC
  Administered 2020-07-21 – 2020-07-25 (×5): 40 mg via ORAL
  Filled 2020-07-21 (×5): qty 2

## 2020-07-21 MED ORDER — SODIUM CHLORIDE 0.9 % IV SOLN: 250.0000 mL | INTRAVENOUS | Status: DC | PRN

## 2020-07-21 MED ORDER — PSYLLIUM 95 % PO PACK
1.0000 | PACK | Freq: Every day | ORAL | Status: DC
  Administered 2020-07-21 – 2020-07-25 (×4): 1 via ORAL
  Filled 2020-07-21 (×4): qty 1

## 2020-07-21 MED ORDER — OXYCODONE-ACETAMINOPHEN 5-325 MG PO TABS
1.0000 | ORAL_TABLET | ORAL | Status: AC | PRN
  Administered 2020-07-21 (×2): 1 via ORAL
  Filled 2020-07-21 (×2): qty 1

## 2020-07-21 MED ORDER — AMLODIPINE BESYLATE 5 MG PO TABS
5.0000 mg | ORAL_TABLET | Freq: Every day | ORAL | Status: DC
  Filled 2020-07-21 (×2): qty 1

## 2020-07-21 MED ORDER — FUROSEMIDE 10 MG/ML IJ SOLN
40.0000 mg | Freq: Two times a day (BID) | INTRAMUSCULAR | Status: DC
  Administered 2020-07-21 – 2020-07-24 (×7): 40 mg via INTRAVENOUS
  Filled 2020-07-21 (×7): qty 4

## 2020-07-21 MED ORDER — DOCUSATE SODIUM 100 MG PO CAPS: 100.0000 mg | ORAL_CAPSULE | Freq: Every day | ORAL | Status: DC | PRN

## 2020-07-21 MED ORDER — ACETAMINOPHEN 325 MG PO TABS: 650.0000 mg | ORAL_TABLET | Freq: Once | ORAL | Status: DC

## 2020-07-21 MED ORDER — SODIUM CHLORIDE 0.9% FLUSH
3.0000 mL | INTRAVENOUS | Status: DC | PRN
  Administered 2020-07-21 – 2020-07-23 (×2): 3 mL via INTRAVENOUS

## 2020-07-21 MED ORDER — APIXABAN 2.5 MG PO TABS
2.5000 mg | ORAL_TABLET | Freq: Two times a day (BID) | ORAL | Status: DC
  Administered 2020-07-21 – 2020-07-25 (×9): 2.5 mg via ORAL
  Filled 2020-07-21 (×11): qty 1

## 2020-07-21 MED ORDER — SODIUM CHLORIDE 0.9% FLUSH
3.0000 mL | Freq: Two times a day (BID) | INTRAVENOUS | Status: DC
  Administered 2020-07-21 – 2020-07-25 (×8): 3 mL via INTRAVENOUS

## 2020-07-21 NOTE — ED Notes (Signed)
Lab to add on CK 

## 2020-07-21 NOTE — ED Notes (Signed)
Jacqlyn Larsen, Network engineer to obtain medical records from [place].

## 2020-07-21 NOTE — ED Notes (Signed)
Lunch Tray Ordered @ 1105. 

## 2020-07-21 NOTE — Progress Notes (Signed)
COVID-19 screening was negative.  Repeat hemoglobin 8.1 which was similar to previous day.  Eliquis reduced to 2.5 mg to kidney function.  LFTs noted mild elevation in AST and ALT slightly higher than previous.  Renal CT from [facility] did note concern for cirrhosis, cholelithiasis without acute cholecystitis, and cardiomegaly from 2/6.  Acute hepatitis panel added on.  Patient's fluid retention could be multifactorial.

## 2020-07-21 NOTE — Evaluation (Signed)
Physical Therapy Evaluation Patient Details Name: Evan Dorsey MRN: [id number] DOB: February 21, 1933 Today's Date: 07/21/2020   History of Present Illness  Pt is an 87 y.o. male who presents with R sided back pain and weakness along with lower extremity swelling. Pt was seen at [facility] and diagnosed with a pinched nerve. Pt would to have acute exacerbation of diastolic congestive heart failure, AKI superimposed on CKD stage 3a, and anemia. PMH: prostate cancer, hx of MI, anemia, HTN, gout, divurticulosis, CAD, bladder cancer, a-fib, arthritis, and aortic stenosis nephrolithiasis.  Clinical Impression  Pt presents with condition above and deficits mentioned below, see PT Problem List. Prior to onset of back pain and weakness, he was independent with all functional mobility and ADLs without AD/AE and driving. More recently, he has been needing a RW and assistance for short household mobility and has ceased driving. He just began getting HH PT PTA. Pt presents to PT with back pain limiting function, balance deficits, and generalized weakness that place him at risk for falls. Pt educated on spinal precautions verbally to relieve back pain with mobility. He is functioning below his baseline needing minA for all functional mobility and displaying 1 posterior LOB with a short gait bout with 2 HHA, needing minA to maintain safety and return to sit EOB. Recommending pt continue to follow-up with his HH PT upon d/c to relieve his back pain, decrease his risk for falls, and maximize his independence and safety with all functional mobility. Will continue to follow acutely.     Follow Up Recommendations Home health PT;Supervision for mobility/OOB    Equipment Recommendations  3in1 (PT) (may not need if pt can walk safely short distance to bathroom)    Recommendations for Other Services OT consult     Precautions / Restrictions Precautions Precautions: Fall;Back (for comfort) Precaution Booklet Issued: No  (Verbally educated pt on spinal precautions for comfort) Precaution Comments: HOH Restrictions Weight Bearing Restrictions: No      Mobility  Bed Mobility Overal bed mobility: Needs Assistance Bed Mobility: Rolling;Sidelying to Sit;Sit to Supine Rolling: Min assist Sidelying to sit: Min assist   Sit to supine: Min assist   General bed mobility comments: Pt on narrow stretcher, cued to bend L knee and push through foot and reach with L hand to keep hips and shoulders aligned to decrease back pain, minA to complete and maintain safety due to narrow surface. MinA to initiate ascension of trunk and cue pt to bring legs off stretcher. MinA to maintain safety with return to supine.    Transfers Overall transfer level: Needs assistance Equipment used: 2 person hand held assist Transfers: Sit to/from Stand Sit to Stand: Min assist         General transfer comment: 2 HHA pt requesting PT pull him to stand and thus pt instead cued to try himself, with him able to initiate and come to stand with minA pulling on PT's hand. Extra time to power up.  Ambulation/Gait Ambulation/Gait assistance: Min assist Gait Distance (Feet): 4 Feet Assistive device: 2 person hand held assist Gait Pattern/deviations: Step-through pattern;Shuffle;Decreased step length - right;Decreased step length - left;Decreased stride length Gait velocity: redcued Gait velocity interpretation: <1.31 ft/sec, indicative of household ambulator General Gait Details: Limited by hooked up to monitor with box that does not disconnect from wall, thus only ambulated a few feet with 2 HHA, displaying unsteadiness and shuffling pattern. When he stepped posteriorly back to bed he quickly went to sit on bed. He  reported he was fatigued and lost his balance.  Stairs            Wheelchair Mobility    Modified Rankin (Stroke Patients Only)       Balance Overall balance assessment: Needs assistance Sitting-balance support:  No upper extremity supported;Feet supported Sitting balance-Leahy Scale: Fair Sitting balance - Comments: Static sitting EOB, min guard for safety. Postural control: Posterior lean Standing balance support: Bilateral upper extremity supported Standing balance-Leahy Scale: Poor Standing balance comment: Reliant on UE and external support.                             Pertinent Vitals/Pain Pain Assessment: Faces Faces Pain Scale: Hurts even more Pain Location: back Pain Descriptors / Indicators: Discomfort;Grimacing;Guarding Pain Intervention(s): Limited activity within patient's tolerance;Monitored during session;Repositioned    Home Living Family/patient expects to be discharged to:: Private residence Living Arrangements: Spouse/significant other Available Help at Discharge: Family;Available 24 hours/day Type of Home: House Home Access: Stairs to enter Entrance Stairs-Rails: Left (ascending) Entrance Stairs-Number of Steps: 2 Home Layout: One level Home Equipment: Walker - 2 wheels;Cane - quad;Shower seat;Grab bars - tub/shower      Prior Function Level of Independence: Independent         Comments: Prior to onset of back pain and weakness he was independent with all ADLs and functional mobility, but has recently been receiving HH PT to improve his mobility as he now uses a RW and needs help to walk short distance to bathroom.     Hand Dominance        Extremity/Trunk Assessment   Upper Extremity Assessment Upper Extremity Assessment: Generalized weakness    Lower Extremity Assessment Lower Extremity Assessment: Generalized weakness;RLE deficits/detail;LLE deficits/detail RLE Deficits / Details: Pitting edema 3+ RLE Sensation: decreased light touch (at dorsal aspect of foot and medial ankle) RLE Coordination: decreased gross motor LLE Deficits / Details: Pitting edema 3+ LLE Sensation: decreased light touch (at dorsal aspect of foot and medial  ankle) LLE Coordination: decreased gross motor    Cervical / Trunk Assessment Cervical / Trunk Assessment: Normal  Communication   Communication: HOH  Cognition Arousal/Alertness: Awake/alert Behavior During Therapy: WFL for tasks assessed/performed Overall Cognitive Status: Within Functional Limits for tasks assessed                                        General Comments      Exercises     Assessment/Plan    PT Assessment Patient needs continued PT services  PT Problem List Decreased strength;Decreased range of motion;Decreased activity tolerance;Decreased balance;Decreased mobility;Decreased coordination;Impaired sensation;Pain       PT Treatment Interventions DME instruction;Gait training;Stair training;Functional mobility training;Therapeutic activities;Therapeutic exercise;Balance training;Neuromuscular re-education;Patient/family education    PT Goals (Current goals can be found in the Care Plan section)  Acute Rehab PT Goals Patient Stated Goal: to get better PT Goal Formulation: With patient/family Time For Goal Achievement: 08/04/20 Potential to Achieve Goals: Good    Frequency Min 3X/week   Barriers to discharge        Co-evaluation               AM-PAC PT "6 Clicks" Mobility  Outcome Measure Help needed turning from your back to your side while in a flat bed without using bedrails?: A Little Help needed moving from lying on your back  to sitting on the side of a flat bed without using bedrails?: A Little Help needed moving to and from a bed to a chair (including a wheelchair)?: A Little Help needed standing up from a chair using your arms (e.g., wheelchair or bedside chair)?: A Little Help needed to walk in hospital room?: A Little Help needed climbing 3-5 steps with a railing? : A Lot 6 Click Score: 17    End of Session   Activity Tolerance: Patient limited by fatigue;Patient limited by pain Patient left: in bed;with call  bell/phone within reach;with family/visitor present Nurse Communication: Mobility status PT Visit Diagnosis: Unsteadiness on feet (R26.81);Other abnormalities of gait and mobility (R26.89);Muscle weakness (generalized) (M62.81);Difficulty in walking, not elsewhere classified (R26.2);Pain Pain - Right/Left:  (back) Pain - part of body:  (back)    Time: 3151-7616 PT Time Calculation (min) (ACUTE ONLY): 32 min   Charges:   PT Evaluation $PT Eval Moderate Complexity: 1 Mod PT Treatments $Therapeutic Activity: 8-22 mins        Moishe Spice, PT, DPT Acute Rehabilitation Services  Pager: [phone] Office: [phone]   Orvan Falconer 07/21/2020, 9:23 AM

## 2020-07-21 NOTE — H&P (Addendum)
History and Physical    Evan Dorsey WSF:[id number] DOB: 1933/03/29 DOA: 07/20/2020  Referring MD/NP/PA: Jennette Kettle, MD PCP: Deland Pretty, MD  Consultants: Dr. Eligha Bridegroom, Dr. Aaron Edelman [place]-cardiology, Dr. Tammi Klippel Urology Patient coming from: Home  Chief Complaint: back pain and weakness  I have personally briefly reviewed patient's old medical records in [place]   HPI: Evan Dorsey is a 87 y.o. male with medical history significant of HTN, HLD, atrial fibrillation on chronic anticoagulation, CAD s/p CABG in '93, aortic stenosis nephrolithiasis, iron deficiency anemia receiving iron transfusions, rectal bleeding, prostate cancer, bladder cancer, BPH, and arthritis who presents with complaints of right-sided back pain and weakness.  History is obtained from the patient and his wife who is present at bedside.  He reports that he had been diagnosed with pneumonia 3 to 4 weeks ago(records note office visits for pneumonia back in December 2021) and have been treated with Z-Pak.  Since that time he still had a persistent cough that is only intermittently productive with clear sputum.  She reports that he is also intermittently wheezing.  He had been having middle to lower back pain on the right side that would radiate down to his buttock region on the right side with certain.  However, pain had gotten to be unbearable.  Patient was seen at [facility] emergency department and diagnosed with a pinched nerve.  They have prescribed him gabapentin 371m to take up to 3 times daily, but it was self discontinued after 2 doses because it "knocked him out" and made him confused.  Over the last 1-2 weeks he reports progressive swelling of the lower extremities which is new.  He is not on any diuretic medications at home.  The swelling has made it difficult for him to get around.  Denies any fevers, chest pain, shortness of breath, abdominal pain, nausea, vomiting, blood in urine, or  dysuria.  Patient records note patient had been seen to have excess volume requiring treatment with diuretic back in February of last year.  Other more so chronic associated symptoms include urinary frequency with decreased output and dark stools related to being on iron supplementation.  He has been receiving iron infusions and he reports that his stools have been lighter in color.  He has been taking Eliquis as prescribed.  ED Course: Upon admission to the emergency department patient was noted to be afebrile, pulse 49-59, and all other vital signs maintained.  Labs from 2/5 significant for hemoglobin 8 (hbg 9 on 1/27), BUN 16, creatinine 2.12 (Cr 1.52 on 1/27), and high-sensitivity troponin 16.  Chest x-ray significant for mild acute on chronic pulmonary interstitial opacity with mild interstitial edema favored over respiratory infection.  Urinalysis was positive for large hemoglobin, negative leukocytes, negative nitrites, 100 protein, rare bacteria, 0-5 RBCs, and 6-10 WBCs.  Patient hadbeen given oxycodone for pain symptoms, but wife noted that it was making him sleepy.  At bedside renal ultrasound has been performed by the ED provider which showed no signs of hydronephrosis, stones or cyst.  COVID-19 screening, BNP, and CK were pending.  Review of Systems  Constitutional: Positive for malaise/fatigue. Negative for fever.  HENT: Positive for hearing loss (Chronic). Negative for ear discharge and nosebleeds.   Eyes: Negative for photophobia and redness.  Respiratory: Positive for cough, sputum production and wheezing. Negative for shortness of breath.   Cardiovascular: Positive for leg swelling. Negative for chest pain.  Gastrointestinal: Negative for abdominal pain, nausea and vomiting.  Positive for dark stool  Genitourinary: Positive for frequency. Negative for hematuria.  Musculoskeletal: Positive for back pain. Negative for falls.  Skin: Negative for rash.  Neurological: Positive for  weakness. Negative for focal weakness and headaches.  Psychiatric/Behavioral: Negative for substance abuse.       Positive for confusion after taking statin medicine    Past Medical History:  Diagnosis Date  . Adenomatous polyp   . Arthritis    "left shoulder" (03/02/2013)  . Atrial fibrillation ([place])   . Bladder cancer ([place]) 07/31/2011  . CAD (coronary artery disease)    a. remote CABG b. prior cardiac cath 2005 c. Lexiscan Myoview 07/08/12 w/o evidence of ischemia, EF 53%  . Diverticulosis   . Elevated PSA   . Frequent urination    "day and night" (03/02/2013)  . GERD (gastroesophageal reflux disease)   . Gout    "been a long time ago" (03/02/2013)  . History of kidney stones   . HLD (hyperlipidemia)   . HTN (hypertension)   . Iron deficiency anemia due to chronic blood loss 06/29/2018  . Myocardial infarction ([place]) 1990's   silent  . Prostate cancer [facility])     Past Surgical History:  Procedure Laterality Date  . CARDIAC CATHETERIZATION     total occlusion to LAD, LCX, RCA. LIMA patent, normal seq SVG to OM, severe stenosis in SVG to PDA, but collaterals to PLA from OM. RCA went to PDA. treated medically   . CARDIOVASCULAR STRESS TEST  02/10/08   6 mets, poor tolerance, no chest pain   . CATARACT EXTRACTION W/ INTRAOCULAR LENS IMPLANT Right 2014  . CORONARY ARTERY BYPASS GRAFT  08/1991   "CABG X4" (03/02/2013)  . CYSTOSCOPY W/ STONE MANIPULATION    . ORIF ACETABULAR FRACTURE Left 04/2007   trimalleolar fracture  . TOTAL SHOULDER ARTHROPLASTY Left 03/02/2013  . TOTAL SHOULDER ARTHROPLASTY Left 03/02/2013   Procedure: LEFT TOTAL SHOULDER ARTHROPLASTY;  Surgeon: Marin Shutter, MD;  Location: [place];  Service: Orthopedics;  Laterality: Left;  . TRANSURETHRAL RESECTION OF BLADDER TUMOR  07/31/2011   Procedure: TRANSURETHRAL RESECTION OF BLADDER TUMOR (TURBT);  Surgeon: Claybon Jabs, MD;  Location: [facility];  Service: Urology;  Laterality: N/A;  with [place]  . URETEROSCOPY WITH HOLMIUM  LASER LITHOTRIPSY Right 11/06/2016   Procedure: URETEROSCOPY WITH HOLMIUM LASER LITHOTRIPSY/ RETROGRADE PYELOGRAM/ STENT;  Surgeon: Kathie Rhodes, MD;  Location: [facility];  Service: Urology;  Laterality: Right;     reports that he has never smoked. He has never used smokeless tobacco. He reports that he does not drink alcohol and does not use drugs.  No Known Allergies  Family History  Problem Relation Age of Onset  . CAD Father 36  . Prostate cancer Brother   . CAD Brother 16  . CAD Brother 80  . Breast cancer Neg Hx   . Colon cancer Neg Hx   . Pancreatic cancer Neg Hx     Prior to Admission medications   Medication Sig Start Date End Date Taking? Authorizing Provider  acetaminophen (TYLENOL) 500 MG tablet Take 500 mg by mouth every 6 (six) hours as needed for moderate pain, headache or fever.   Yes [provider]  amiodarone (PACERONE) 200 MG tablet TAKE ONE TABLET EVERY DAY Patient taking differently: Take 200 mg by mouth daily. 05/21/20  Yes Lelon Perla, MD  amLODipine (NORVASC) 5 MG tablet TAKE ONE TABLET BY MOUTH EVERY DAY Patient taking differently: Take 5 mg by  mouth daily. 12/21/19  Yes Lelon Perla, MD  calcium carbonate (TUMS - DOSED IN MG ELEMENTAL CALCIUM) 500 MG chewable tablet Chew 2 tablets by mouth daily.   Yes [provider]  cholecalciferol (VITAMIN D) 1000 units tablet Take 1,000 Units by mouth daily.   Yes [provider]  docusate sodium (COLACE) 100 MG capsule Take 1 capsule (100 mg total) by mouth daily. Do not take if you have loose stools Patient taking differently: Take 100 mg by mouth daily as needed for mild constipation. 03/31/19  Yes Earlie Server, MD  ELIQUIS 5 MG TABS tablet TAKE ONE TABLET BY MOUTH TWICE DAILY Patient taking differently: Take 5 mg by mouth 2 (two) times daily. 04/24/20  Yes Lelon Perla, MD  nitroGLYCERIN (NITROSTAT) 0.4 MG SL tablet PLACE 1 TABLET UNDER THE TONGUE EVERY 5 MINUTES  IF NEEDED FOR CHEST PAIN. MAX = 3 TABS/EPISODE. Patient taking differently: Place 0.4 mg under the tongue every 5 (five) minutes as needed for chest pain. 06/10/18  Yes Josue Hector, MD  pantoprazole (PROTONIX) 40 MG tablet Take 40 mg by mouth 2 (two) times daily. 07/11/20  Yes [provider]  psyllium (HYDROCIL/METAMUCIL) 95 % PACK Take 1 packet by mouth daily. 03/11/18  Yes Hosie Poisson, MD  rosuvastatin (CRESTOR) 40 MG tablet Take 40 mg by mouth daily. 02/09/18  Yes [provider]  traMADol (ULTRAM) 50 MG tablet Take 50 mg by mouth every 6 (six) hours as needed for moderate pain.   Yes [provider]  Leuprolide Acetate 5 MG/ML KIT 0.2 ml    [provider]  potassium chloride SA (KLOR-CON) 20 MEQ tablet Take 1 tablet (20 mEq total) by mouth 2 (two) times daily for 7 days. Patient not taking: Reported on 07/21/2020 07/11/20 07/18/20  Earlie Server, MD    Physical Exam:  Constitutional: Elderly male NAD, calm, comfortable Vitals:   07/21/20 0545 07/21/20 0600 07/21/20 0615 07/21/20 0615  BP: 131/62 133/62 (!) 133/53   Pulse: (!) 55 (!) 51 (!) 57   Resp: '16 16 17   ' Temp:      SpO2: 95% 96% 95%   Weight:    102.1 kg  Height:    '5\' 11"'  (1.803 m)   Eyes: PERRL, lids and conjunctivae normal ENMT: Mucous membranes are moist. Posterior pharynx clear of any exudate or lesions.  Hard of hearing. Neck: normal, supple, no masses, no thyromegaly.  No significant JVD appreciated Respiratory: Normal respiratory effort with positive intermittent crackles appreciated.  No significant wheezing noted at this time. Cardiovascular: Bradycardic with positive systolic ejection murmur 1/6.  +3 pitting bilateral lower extremity edema appreciated. Abdomen: no tenderness, no masses palpated. No hepatosplenomegaly. Bowel sounds positive.  Musculoskeletal: no clubbing / cyanosis.  Tenderness to palpation at the upper lumbar spine. Skin: no rashes, lesions, ulcers. No  induration Neurologic: CN 2-12 grossly intact. Sensation intact, DTR normal. Strength 5/5 in all 4.  Psychiatric: Normal judgment and insight. Alert and oriented x 3. Normal mood.     Labs on Admission: I have personally reviewed following labs and imaging studies  CBC: Recent Labs  Lab 07/20/20 1511  WBC 7.1  HGB 8.0*  HCT 27.6*  MCV 89.6  PLT 951   Basic Metabolic Panel: Recent Labs  Lab 07/18/20 1310 07/20/20 1511  NA 139 136  K 4.4 3.9  CL 113* 110  CO2 19* 17*  GLUCOSE 89 112*  BUN 20 16  CREATININE 2.14* 2.12*  CALCIUM 9.0  9.2   GFR: Estimated Creatinine Clearance: 31 mL/min (A) (by C-G formula based on SCr of 2.12 mg/dL (H)). Liver Function Tests: No results for input(s): AST, ALT, ALKPHOS, BILITOT, PROT, ALBUMIN in the last 168 hours. No results for input(s): LIPASE, AMYLASE in the last 168 hours. No results for input(s): AMMONIA in the last 168 hours. Coagulation Profile: No results for input(s): INR, PROTIME in the last 168 hours. Cardiac Enzymes: No results for input(s): CKTOTAL, CKMB, CKMBINDEX, TROPONINI in the last 168 hours. BNP (last 3 results) No results for input(s): PROBNP in the last 8760 hours. HbA1C: No results for input(s): HGBA1C in the last 72 hours. CBG: Recent Labs  Lab 07/21/20 0403  GLUCAP 113*   Lipid Profile: No results for input(s): CHOL, HDL, LDLCALC, TRIG, CHOLHDL, LDLDIRECT in the last 72 hours. Thyroid Function Tests: No results for input(s): TSH, T4TOTAL, FREET4, T3FREE, THYROIDAB in the last 72 hours. Anemia Panel: No results for input(s): VITAMINB12, FOLATE, FERRITIN, TIBC, IRON, RETICCTPCT in the last 72 hours. Urine analysis:    Component Value Date/Time   COLORURINE YELLOW 07/20/2020 2021   APPEARANCEUR CLOUDY (A) 07/20/2020 2021   LABSPEC 1.020 07/20/2020 2021   PHURINE 5.0 07/20/2020 2021   GLUCOSEU NEGATIVE 07/20/2020 2021   GLUCOSEU NEGATIVE 04/22/2011 1541   HGBUR LARGE (A) 07/20/2020 2021   BILIRUBINUR  NEGATIVE 07/20/2020 2021   [place] 07/20/2020 2021   PROTEINUR 100 (A) 07/20/2020 2021   UROBILINOGEN 0.2 04/22/2011 1541   NITRITE NEGATIVE 07/20/2020 2021   LEUKOCYTESUR NEGATIVE 07/20/2020 2021   Sepsis Labs: No results found for this or any previous visit (from the past 240 hour(s)).   Radiological Exams on Admission: US RENAL  Result Date: 07/21/2020 CLINICAL DATA:  87 year old male with acute renal insufficiency. Hematuria and pain. EXAM: RENAL / URINARY TRACT ULTRASOUND COMPLETE COMPARISON:  CT Abdomen and Pelvis 07/14/2020. FINDINGS: Right Kidney: Renal measurements: 10.5 x 5.1 x 5.0 cm = volume: 140 mL. Renal cortical thinning. No hydronephrosis. Simple 4.8 cm exophytic right renal cyst redemonstrated and appears benign. No other right renal cyst is evident. Left Kidney: Renal measurements: The left kidney could not be identified by ultrasound, obscured by bowel gas and other acoustic shadowing (image 15). The left kidney was not obstructed on 07/14/2020. Bladder: Diminutive, unremarkable. Other: None. IMPRESSION: 1. Left kidney cannot be identified by ultrasound today. 2. Stable, nonobstructed right kidney since the CT on 07/14/2020. 3. Diminutive urinary bladder. Electronically Signed   By: Genevie Ann M.D.   On: 07/21/2020 07:16   DG Chest Port 1 View  Result Date: 07/21/2020 CLINICAL DATA:  87 year old male with lower extremity swelling. Weakness. Back pain. Wheezing. EXAM: PORTABLE CHEST 1 VIEW COMPARISON:  Chest radiographs 06/26/2020 and earlier. FINDINGS: Portable AP upright view at 0509 hours. Stable cardiac size and mediastinal contours. Stable sequelae of prior CABG, left shoulder arthroplasty. Acute on chronic bilateral pulmonary interstitial opacity, basilar predominant and fairly symmetric. No superimposed pneumothorax. No pleural effusion identified. No consolidation or confluent opacity. No acute osseous abnormality identified. IMPRESSION: Mild acute on chronic  pulmonary interstitial opacity. Mild interstitial edema favored over viral/atypical respiratory infection. Electronically Signed   By: Genevie Ann M.D.   On: 07/21/2020 05:28    EKG: Independently reviewed.  Sinus bradycardia at 53 bpm with QTc 527  Assessment/Plan  Diastolic congestive heart failure exacerbation: Acute.  Patient presents with complaints of weakness.  Found to have a 3+ pitting edema of the bilateral lower extremities.  Chest x-ray appears to  be more concerning for edema than infection.  Last echocardiogram revealed EF of 55 to 60% with grade 1 diastolic dysfunction back in 01/2020.  Suspect CHF exacerbation less likely pneumonia. -Admit to a cardiac telemetry bed -Heart failure order set utilized -Strict I&Os and daily weights -Check TSH -Follow-up BNP(352.4) and COVID-19 screening -Lasix 40 mg IV twice daily -Elevate lower extremities -Check echocardiogram -Message sent for cardiology to eval in a.m.  Acute kidney injury superimposed on chronic kidney disease 3a acute on chronic hematuria: Patient presents with creatinine elevated up to 2.12 with BUN 16.  Creatinine had been 1.52 on 1/27, but previously ranged from 1-1.2 per review of records.  Urinalysis not positive hemoglobin,  rare bacteria, 6-10 WBCs, negative leukocytes, and negative nitrites making UTI seem less likely. Bedside ultrasound did not note any signs of obstruction on stones.  Formal ultrasound was not able to identify the left kidney, and did not note any abnormalities on the right kidney.  Suspecting hypoperfusion related with congestive heart failure exacerbation. -Check urine culture -Check urine sodium and urine creatinine -Follow-up CK -Continue to monitor kidney function daily  Iron deficiency anemia: Acute on chronic.  Hemoglobin was noted to be 8 on 2/5, but had previously been 9 on 1/27.  Work-up so far noted some hematuria.  He has been followed in outpatient setting by Dr. Tasia Catchings hematology oncology  and has been on iron transfusions weekly.  He received his 3 of 4 scheduled weekly transfusions on 2/3.  Since being on iron infusion patient reports that stools have been a little lighter in color since no longer being on oral supplementation.  Differential includes acute blood loss vs. fluid overload vs -Type and screen for possible need of blood product -Check CBC and stool guaiac -Continue to monitor and consider transfusion if hemoglobin is less than 8 given heart history  Back pain: Acute on chronic.  Previously evaluated at [facility] and reported to have a pinched nerve at the back.  Reportedly unable to tolerate gabapentin 300 mg 3 times daily after taking 2 doses due to lethargy and confusion.  In the ER patient has been given oxycodone for pain, but noted to have made him significantly sleepy. -Obtain records from work-up done at [place] as needed pain -May benefit from another trial of gabapentin at 100 mg -PT to evaluate and treat given weakness and complaints of back   Paroxysmal atrial fibrillation on chronic anticoagulation: Patient appears to be currently in sinus sinus bradycardia with heart rates in 40-50s. -Continue amiodarone  -Held Eliquis, but will need to be restarted if hemoglobin remain stay  Prolonged QT interval: Chronic.  On admission QTC 527. -Avoiding additional QT prolonging medications -Correct any electrolyte abnormalities -Follow-up telemetry   CAD: Patient denies any reports of any chest pain.  Initial high-sensitivity troponin negative.  Patient with remote history of CABG back in 1993. -Continue statin  Essential hypertension: Blood pressures currently stable. -Continue home medication amlodipine 5 mg daily  Hyperlipidemia -Continue home medication Crestor 40 mg daily  Metabolic acidosis: Acute on admission CO2 17 without elevated anion gap. 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  He had previously received radiation treatment with a history of prostate cancer and had been receiving Leuprolide injections. -Outpatient follow-up  GERD: Home medication regimen includes Protonix 40 mg twice daily. -Held protonix due to prolonged QT  DVT  prophylaxis: SCDs with plans to restart Eliquis if stable no signs of bleeding code Status: Full Family Communication: Wife updated at bedside Disposition Plan: Hopefully discharge home once medically Consults called: Cardiology Admission status: Inpatient, requiring more than 2 midnight stay  Norval Morton MD [facility]   If 7PM-7AM, please contact night-coverage   07/21/2020, 7:27 AM

## 2020-07-21 NOTE — ED Provider Notes (Signed)
[facility] EMERGENCY DEPARTMENT Provider Note   CSN: [id number] Arrival date & time: 07/20/20  1417     History Chief Complaint  Patient presents with  . Weakness  . Back Pain    Evan Dorsey is a 87 y.o. male.  Patient with past medical history notable for A. fib, CAD on Eliquis, prostate cancer, iron deficient anemia receiving iron infusion therapy, presents to the emergency department with a chief complaint of generalized weakness.  He reports that he has had significantly increased weakness over the past several days.  His wife states that he has been wheezing.  Patient reports that he has had lower extremity swelling, which has been new over the past week or so.  He denies any history of CHF.  He denies having any chest pain.  Additionally, patient reports that he has chronic low back pain and has a pinched nerve that is making it hard for him to be mobile.  The history is provided by the patient. No language interpreter was used.  Weakness Back Pain      Past Medical History:  Diagnosis Date  . Adenomatous polyp   . Arthritis    "left shoulder" (03/02/2013)  . Atrial fibrillation ([place])   . Bladder cancer ([place]) 07/31/2011  . CAD (coronary artery disease)    a. remote CABG b. prior cardiac cath 2005 c. Lexiscan Myoview 07/08/12 w/o evidence of ischemia, EF 53%  . Diverticulosis   . Elevated PSA   . Frequent urination    "day and night" (03/02/2013)  . GERD (gastroesophageal reflux disease)   . Gout    "been a long time ago" (03/02/2013)  . History of kidney stones   . HLD (hyperlipidemia)   . HTN (hypertension)   . Iron deficiency anemia due to chronic blood loss 06/29/2018  . Myocardial infarction ([place]) 1990's   silent  . Prostate cancer [facility])     Patient Active Problem List   Diagnosis Date Noted  . Goals of care, counseling/discussion 07/09/2018  . Iron deficiency anemia due to chronic blood loss 06/29/2018  . Malignant neoplasm of  prostate ([place]) 04/22/2018  . Postural dizziness with presyncope 03/07/2018  . Rectal bleeding 03/07/2018  . Preop cardiovascular exam 02/10/2013  . Paroxysmal atrial fibrillation ([place]) 07/09/2012  . Hypokalemia 07/09/2012  . Precordial pain 07/07/2012  . Bladder cancer ([place]) 07/31/2011  . Hematuria 04/22/2011  . Atrial fibrillation ([place]) 08/29/2010  . HYPERCHOLESTEROLEMIA 02/25/2010  . HYPERLIPIDEMIA-MIXED 02/25/2010  . HYPERTENSION, BENIGN 02/25/2010  . CORONARY ATHEROSCLEROSIS NATIVE CORONARY ARTERY 02/25/2010  . CAD, ARTERY BYPASS GRAFT 02/25/2010    Past Surgical History:  Procedure Laterality Date  . CARDIAC CATHETERIZATION     total occlusion to LAD, LCX, RCA. LIMA patent, normal seq SVG to OM, severe stenosis in SVG to PDA, but collaterals to PLA from OM. RCA went to PDA. treated medically   . CARDIOVASCULAR STRESS TEST  02/10/08   6 mets, poor tolerance, no chest pain   . CATARACT EXTRACTION W/ INTRAOCULAR LENS IMPLANT Right 2014  . CORONARY ARTERY BYPASS GRAFT  08/1991   "CABG X4" (03/02/2013)  . CYSTOSCOPY W/ STONE MANIPULATION    . ORIF ACETABULAR FRACTURE Left 04/2007   trimalleolar fracture  . TOTAL SHOULDER ARTHROPLASTY Left 03/02/2013  . TOTAL SHOULDER ARTHROPLASTY Left 03/02/2013   Procedure: LEFT TOTAL SHOULDER ARTHROPLASTY;  Surgeon: Marin Shutter, MD;  Location: [facility];  Service: Orthopedics;  Laterality: Left;  . TRANSURETHRAL RESECTION OF BLADDER TUMOR  07/31/2011   Procedure: TRANSURETHRAL RESECTION OF BLADDER TUMOR (TURBT);  Surgeon: Claybon Jabs, MD;  Location: [facility];  Service: Urology;  Laterality: N/A;  with [place]  . URETEROSCOPY WITH HOLMIUM LASER LITHOTRIPSY Right 11/06/2016   Procedure: URETEROSCOPY WITH HOLMIUM LASER LITHOTRIPSY/ RETROGRADE PYELOGRAM/ STENT;  Surgeon: Kathie Rhodes, MD;  Location: [facility];  Service: Urology;  Laterality: Right;       Family History  Problem Relation Age of Onset  . CAD Father 35  . Prostate  cancer Brother   . CAD Brother 58  . CAD Brother 57  . Breast cancer Neg Hx   . Colon cancer Neg Hx   . Pancreatic cancer Neg Hx     Social History   Tobacco Use  . Smoking status: Never Smoker  . Smokeless tobacco: Never Used  Vaping Use  . Vaping Use: Never used  Substance Use Topics  . Alcohol use: No  . Drug use: No    Home Medications Prior to Admission medications   Medication Sig Start Date End Date Taking? Authorizing Provider  acetaminophen (TYLENOL) 500 MG tablet Take 500 mg by mouth every 6 (six) hours as needed.    [provider]  amiodarone (PACERONE) 200 MG tablet TAKE ONE TABLET EVERY DAY 05/21/20   Lelon Perla, MD  amLODipine (NORVASC) 5 MG tablet TAKE ONE TABLET BY MOUTH EVERY DAY 12/21/19   Lelon Perla, MD  calcium carbonate (TUMS - DOSED IN MG ELEMENTAL CALCIUM) 500 MG chewable tablet Chew 2 tablets by mouth daily.    [provider]  Calcium Polycarbophil (FIBER) 625 MG TABS 1 tablet as needed    [provider]  cholecalciferol (VITAMIN D) 1000 units tablet Take 1,000 Units by mouth daily. Patient not taking: No sig reported    [provider]  Cholecalciferol 50 MCG (2000 UT) CAPS     [provider]  clobetasol cream (TEMOVATE) 2.70 % 1 application to affected area    [provider]  docusate sodium (COLACE) 100 MG capsule Take 1 capsule (100 mg total) by mouth daily. Do not take if you have loose stools 03/31/19   Earlie Server, MD  ELIQUIS 5 MG TABS tablet TAKE ONE TABLET BY MOUTH TWICE DAILY 04/24/20   Lelon Perla, MD  ferrous sulfate 300 (60 Fe) MG/5ML syrup See admin instructions.    [provider]  Leuprolide Acetate 5 MG/ML KIT 0.2 ml    [provider]  nitroGLYCERIN (NITROSTAT) 0.4 MG SL tablet PLACE 1 TABLET UNDER THE TONGUE EVERY 5 MINUTES IF NEEDED FOR CHEST PAIN. MAX = 3 TABS/EPISODE. 06/10/18   Josue Hector, MD  pantoprazole (PROTONIX) 40 MG tablet Take 40  mg by mouth 2 (two) times daily. 07/11/20   [provider]  potassium chloride SA (KLOR-CON) 20 MEQ tablet Take 1 tablet (20 mEq total) by mouth 2 (two) times daily for 7 days. 07/11/20 07/18/20  Earlie Server, MD  psyllium (HYDROCIL/METAMUCIL) 95 % PACK Take 1 packet by mouth daily. 03/11/18   Hosie Poisson, MD  rosuvastatin (CRESTOR) 40 MG tablet Take 40 mg by mouth daily. 02/09/18   [provider]  triamcinolone (KENALOG) 0.1 % 1 application to affected area    [provider]    Allergies    Patient has no known allergies.  Review of Systems   Review of Systems  All other systems reviewed and are negative.   Physical Exam Updated Vital Signs BP  131/66   Pulse (!) 55   Temp (!) 97.3 F (36.3 C)   Resp 15   SpO2 98%   Physical Exam Vitals and nursing note reviewed.  Constitutional:      Appearance: He is well-developed and well-nourished.  HENT:     Head: Normocephalic and atraumatic.  Eyes:     Conjunctiva/sclera: Conjunctivae normal.  Cardiovascular:     Rate and Rhythm: Normal rate and regular rhythm.     Heart sounds: No murmur heard.   Pulmonary:     Effort: Pulmonary effort is normal. No respiratory distress.     Breath sounds: Normal breath sounds.     Comments: Diminished lung sounds Abdominal:     Palpations: Abdomen is soft.     Tenderness: There is no abdominal tenderness.  Musculoskeletal:     Cervical back: Neck supple.     Right lower leg: Edema present.     Left lower leg: Edema present.     Comments: Bilateral 3+ pitting edema  Skin:    General: Skin is warm and dry.  Neurological:     Mental Status: He is alert and oriented to person, place, and time.  Psychiatric:        Mood and Affect: Mood and affect and mood normal.        Behavior: Behavior normal.     ED Results / Procedures / Treatments   Labs (all labs ordered are listed, but only abnormal results are displayed) Labs Reviewed  BASIC METABOLIC PANEL -  Abnormal; Notable for the following components:      Result Value   CO2 17 (*)    Glucose, Bld 112 (*)    Creatinine, Ser 2.12 (*)    GFR, Estimated 30 (*)    All other components within normal limits  CBC - Abnormal; Notable for the following components:   RBC 3.08 (*)    Hemoglobin 8.0 (*)    HCT 27.6 (*)    MCHC 29.0 (*)    RDW 25.2 (*)    nRBC 0.3 (*)    All other components within normal limits  URINALYSIS, ROUTINE W REFLEX MICROSCOPIC - Abnormal; Notable for the following components:   APPearance CLOUDY (*)    Hgb urine dipstick LARGE (*)    Protein, ur 100 (*)    Bacteria, UA RARE (*)    All other components within normal limits  CBG MONITORING, ED - Abnormal; Notable for the following components:   Glucose-Capillary 113 (*)    All other components within normal limits  TROPONIN I (HIGH SENSITIVITY)    EKG EKG Interpretation  Date/Time:  Saturday July 20 2020 15:04:50 EST Ventricular Rate:  53 PR Interval:  154 QRS Duration: 174 QT Interval:  562 QTC Calculation: 527 R Axis:   63 Text Interpretation: Sinus bradycardia Right bundle branch block T wave abnormality, consider inferior ischemia Abnormal ECG No acute changes Confirmed by Addison Lank [phone]) on 07/21/2020 5:19:59 AM   Radiology DG Chest Port 1 View  Result Date: 07/21/2020 CLINICAL DATA:  87 year old male with lower extremity swelling. Weakness. Back pain. Wheezing. EXAM: PORTABLE CHEST 1 VIEW COMPARISON:  Chest radiographs 06/26/2020 and earlier. FINDINGS: Portable AP upright view at 0509 hours. Stable cardiac size and mediastinal contours. Stable sequelae of prior CABG, left shoulder arthroplasty. Acute on chronic bilateral pulmonary interstitial opacity, basilar predominant and fairly symmetric. No superimposed pneumothorax. No pleural effusion identified. No consolidation or confluent opacity. No acute osseous abnormality identified. IMPRESSION: Mild acute  on chronic pulmonary interstitial opacity.  Mild interstitial edema favored over viral/atypical respiratory infection. Electronically Signed   By: Genevie Ann M.D.   On: 07/21/2020 05:28    Procedures Procedures   Medications Ordered in ED Medications  oxyCODONE-acetaminophen (PERCOCET/ROXICET) 5-325 MG per tablet 1 tablet (1 tablet Oral Given 07/21/20 0029)    ED Course  I have reviewed the triage vital signs and the nursing notes.  Pertinent labs & imaging results that were available during my care of the patient were reviewed by me and considered in my medical decision making (see chart for details).    MDM Rules/Calculators/A&P                          This patient complains of generalized weakness and back pain, this involves an extensive number of treatment options, and is a complaint that carries with it a high risk of complications and morbidity.    Differential Dx Covid, UTI, kidney stone, electrolyte derangement, ACS  Pertinent Labs I ordered, reviewed, and interpreted labs, which included CBC shows downtrending hemoglobin, now 8.0, was 9.0 on 1/27 (history of iron deficient anemia receiving iron infusions), creatinine 2.12, was 1.52 on 1/27 concern for AKI.  Imaging Interpretation I ordered imaging studies which included chest x-ray.  I independently visualized and interpreted the chest x-ray, which showed acute on chronic pulmonary interstitial opacity, mild edema.   Bedside US performed by Dr. Leonette Monarch shows no evidence of hydronephrosis, doubt KS or pyelo.  Consultants Appreciate Dr. Alcario Drought for accepting in admission.  Carry over to the day team.  Appears stable to wait until after 7 to be seen.  Plan Admit     Final Clinical Impression(s) / ED Diagnoses Final diagnoses:  Generalized weakness  AKI (acute kidney injury) [facility])  Lower extremity edema    Rx / DC Orders ED Discharge Orders    None       Montine Circle, PA-C 07/21/20 0558    Fatima Blank, MD 07/21/20 [phone]

## 2020-07-22 ENCOUNTER — Other Ambulatory Visit: Payer: Self-pay

## 2020-07-22 ENCOUNTER — Other Ambulatory Visit (HOSPITAL_COMMUNITY): Payer: Self-pay

## 2020-07-22 DIAGNOSIS — D500 Iron deficiency anemia secondary to blood loss (chronic): Secondary | ICD-10-CM

## 2020-07-22 DIAGNOSIS — R531 Weakness: Secondary | ICD-10-CM

## 2020-07-22 DIAGNOSIS — R9431 Abnormal electrocardiogram [ECG] [EKG]: Secondary | ICD-10-CM

## 2020-07-22 DIAGNOSIS — N189 Chronic kidney disease, unspecified: Secondary | ICD-10-CM

## 2020-07-22 DIAGNOSIS — I2510 Atherosclerotic heart disease of native coronary artery without angina pectoris: Secondary | ICD-10-CM

## 2020-07-22 DIAGNOSIS — N183 Chronic kidney disease, stage 3 unspecified: Secondary | ICD-10-CM

## 2020-07-22 DIAGNOSIS — I10 Essential (primary) hypertension: Secondary | ICD-10-CM

## 2020-07-22 DIAGNOSIS — I5033 Acute on chronic diastolic (congestive) heart failure: Secondary | ICD-10-CM

## 2020-07-22 DIAGNOSIS — R600 Localized edema: Secondary | ICD-10-CM

## 2020-07-22 DIAGNOSIS — E785 Hyperlipidemia, unspecified: Secondary | ICD-10-CM

## 2020-07-22 DIAGNOSIS — N179 Acute kidney failure, unspecified: Secondary | ICD-10-CM

## 2020-07-22 DIAGNOSIS — I25810 Atherosclerosis of coronary artery bypass graft(s) without angina pectoris: Secondary | ICD-10-CM

## 2020-07-22 DIAGNOSIS — I480 Paroxysmal atrial fibrillation: Secondary | ICD-10-CM

## 2020-07-22 LAB — BASIC METABOLIC PANEL
Anion gap: 13 (ref 5–15)
BUN: 19 mg/dL (ref 8–23)
CO2: 18 mmol/L — ABNORMAL LOW (ref 22–32)
Calcium: 9.5 mg/dL (ref 8.9–10.3)
Chloride: 108 mmol/L (ref 98–111)
Creatinine, Ser: 2.17 mg/dL — ABNORMAL HIGH (ref 0.61–1.24)
GFR, Estimated: 29 mL/min — ABNORMAL LOW (ref >60)
Glucose, Bld: 115 mg/dL — ABNORMAL HIGH (ref 70–99)
Potassium: 3.6 mmol/L (ref 3.5–5.1)
Sodium: 139 mmol/L (ref 135–145)

## 2020-07-22 LAB — CBC
HCT: 29.8 % — ABNORMAL LOW (ref 39.0–52.0)
Hemoglobin: 8.8 g/dL — ABNORMAL LOW (ref 13.0–17.0)
MCH: 26.7 pg (ref 26.0–34.0)
MCHC: 29.5 g/dL — ABNORMAL LOW (ref 30.0–36.0)
MCV: 90.3 fL (ref 80.0–100.0)
Platelets: 220 10*3/uL (ref 150–400)
RBC: 3.3 MIL/uL — ABNORMAL LOW (ref 4.22–5.81)
RDW: 26.3 % — ABNORMAL HIGH (ref 11.5–15.5)
WBC: 6.2 10*3/uL (ref 4.0–10.5)
nRBC: 0.3 % — ABNORMAL HIGH (ref 0.0–0.2)

## 2020-07-22 LAB — HEPATITIS PANEL, ACUTE
HCV Ab: NONREACTIVE
Hep A IgM: REACTIVE — AB
Hep B C IgM: NONREACTIVE
Hepatitis B Surface Ag: NONREACTIVE

## 2020-07-22 LAB — URINE CULTURE

## 2020-07-22 MED ORDER — HYDROCODONE-ACETAMINOPHEN 5-325 MG PO TABS
1.0000 | ORAL_TABLET | Freq: Once | ORAL | Status: AC
  Administered 2020-07-22: 1 via ORAL

## 2020-07-22 NOTE — ED Notes (Signed)
Tele Breakfast order placed 

## 2020-07-22 NOTE — Plan of Care (Signed)
  Problem: Safety: Goal: Ability to remain free from injury will improve Outcome: Progressing   

## 2020-07-22 NOTE — Consult Note (Addendum)
Cardiology Consultation:   Patient ID: Evan Dorsey MRN: [id number]; DOB: 1932-10-19  Admit date: 07/20/2020 Date of Consult: 07/22/2020  Primary Care Provider: Deland Pretty, MD [facility] Cardiologist: [name], MD  [facility] Electrophysiologist:  None    Patient Profile:   Evan Dorsey is a 87 y.o. male with a PMH of CAD s/p CABG in 9767, chronic diastolic CHF, paroxysmal atrial fibrillation on eliquis, aortic stenosis, HTN, HLD, prostate cancer, bladder cancer, iron deficiency anemia currently undergoing iron infusions, and CKD stage 3, who is being seen today for the evaluation of CHF at the request of Dr. Rockne Menghini.  History of Present Illness:   Evan Dorsey presented to the hospital 07/20/20 with complaints of generalized weakness and right-sided back pain. He was previously evaluated in [facility] ED for similar complaints, diagnosed with a "pinched nerve", and given an Rx for gabapentin which he did not tolerate due to side effects (confusion and sedation). He also reports recent trouble with LE edema over the past 1-2 weeks which is new, making it difficult for him to ambulate. Additionally he reports an episode of PNA ~1 month ago for which he was treated with a Z-pak. He has continued to have a persistent cough with intermittent clear sputum. His back pain progressively worsened prompting him to present to the ED for further evaluation.   He was last evaluated by cardiology at an outpatient visit with Dr. Stanford Breed 01/2020, at which time he was doing well from a cardiac standpoint without significant complaints. CXR for amiodarone surveillance was unremarkable. Repeat echo for monitoring his aortic stenosis showed EF 55-60%, moderate concentric LVH, no RWMA, G1DD, mild LAE, mild AS, and moderate dilation of the ascending aorta measuring 60m. His last ischemic evaluation was a NST in 2017 which was without ischemia.  At the time of this evaluation he reports his primary  complaint continues to be back pain. He reports chronic LE edema which is generally present at the end of the day, though resolves upon waking in the morning. Thinks it may be a bit worse over the past several weeks, though again is always resolved upon waking. He denies any orthopnea, PND, SOB, changes in chronic DOE, or weight gain. He has no complaints of chest pain, dizziness, lightheadedness, or syncope. No complaints of bleeding on eliquis. Patient reports he did wait in the ED waiting area from 2:20pm 07/20/20 to 6:30am 07/21/20.   Hospital course: bradycardic to the 40s at times, BP soft but stable, satting well on RA, afebrile. Labs notable for electrolytes wnl, Cr 2.12 (up rom baseline 1.5 07/11/20), mildly elevated AST/ALT, Hgb 8.0, PLT 223, HsTrop 16>15, BNP 352, ,procal 0.23, TSH wnl, UCx with multiple species (suggest recollection), COVID-19 negative. EKG with sinus bradycardia, rate 53 bpm, chronic RBBB, non-specific T wave abnormalities. CXR with mild acute on chronic pulmonary interstitial opacities c/w mild edema over viral/atypical infection. Renal [place]ore[place]ith stable right kidney but unable to identify left kidney by ultrasound. He was admitted to medicine and started on IV lasix 463mBID. UOP with -60024mhen documented. Cardiology asked to evaluate for CHF.     Past Medical History:  Diagnosis Date  . Adenomatous polyp   . Arthritis    "left shoulder" (03/02/2013)  . Atrial fibrillation ([facility] . Bladder cancer ([facility]/15/2013  . CAD (coronary artery disease)    a. remote CABG b. prior cardiac cath 2005 c. Lexiscan Myoview 07/08/12 w/o evidence of ischemia, EF 53%  . Diverticulosis   .  Elevated PSA   . Frequent urination    "day and night" (03/02/2013)  . GERD (gastroesophageal reflux disease)   . Gout    "been a long time ago" (03/02/2013)  . History of kidney stones   . HLD (hyperlipidemia)   . HTN (hypertension)   . Iron deficiency anemia due to chronic blood loss 06/29/2018  .  Myocardial infarction ([place]) 1990's   silent  . Prostate cancer [facility])     Past Surgical History:  Procedure Laterality Date  . CARDIAC CATHETERIZATION     total occlusion to LAD, LCX, RCA. LIMA patent, normal seq SVG to OM, severe stenosis in SVG to PDA, but collaterals to PLA from OM. RCA went to PDA. treated medically   . CARDIOVASCULAR STRESS TEST  02/10/08   6 mets, poor tolerance, no chest pain   . CATARACT EXTRACTION W/ INTRAOCULAR LENS IMPLANT Right 2014  . CORONARY ARTERY BYPASS GRAFT  08/1991   "CABG X4" (03/02/2013)  . CYSTOSCOPY W/ STONE MANIPULATION    . ORIF ACETABULAR FRACTURE Left 04/2007   trimalleolar fracture  . TOTAL SHOULDER ARTHROPLASTY Left 03/02/2013  . TOTAL SHOULDER ARTHROPLASTY Left 03/02/2013   Procedure: LEFT TOTAL SHOULDER ARTHROPLASTY;  Surgeon: Marin Shutter, MD;  Location: [facility];  Service: Orthopedics;  Laterality: Left;  . TRANSURETHRAL RESECTION OF BLADDER TUMOR  07/31/2011   Procedure: TRANSURETHRAL RESECTION OF BLADDER TUMOR (TURBT);  Surgeon: Claybon Jabs, MD;  Location: [facility];  Service: Urology;  Laterality: N/A;  with [place]  . URETEROSCOPY WITH HOLMIUM LASER LITHOTRIPSY Right 11/06/2016   Procedure: URETEROSCOPY WITH HOLMIUM LASER LITHOTRIPSY/ RETROGRADE PYELOGRAM/ STENT;  Surgeon: Kathie Rhodes, MD;  Location: [facility];  Service: Urology;  Laterality: Right;     Home Medications:  Prior to Admission medications   Medication Sig Start Date End Date Taking? Authorizing Provider  acetaminophen (TYLENOL) 500 MG tablet Take 500 mg by mouth every 6 (six) hours as needed for moderate pain, headache or fever.   Yes [provider]  amiodarone (PACERONE) 200 MG tablet TAKE ONE TABLET EVERY DAY Patient taking differently: Take 200 mg by mouth daily. 05/21/20  Yes Lelon Perla, MD  amLODipine (NORVASC) 5 MG tablet TAKE ONE TABLET BY MOUTH EVERY DAY Patient taking differently: Take 5 mg by mouth daily. 12/21/19  Yes Lelon Perla, MD  calcium carbonate (TUMS - DOSED IN MG ELEMENTAL CALCIUM) 500 MG chewable tablet Chew 2 tablets by mouth daily.   Yes [provider]  cholecalciferol (VITAMIN D) 1000 units tablet Take 1,000 Units by mouth daily.   Yes [provider]  docusate sodium (COLACE) 100 MG capsule Take 1 capsule (100 mg total) by mouth daily. Do not take if you have loose stools Patient taking differently: Take 100 mg by mouth daily as needed for mild constipation. 03/31/19  Yes Earlie Server, MD  ELIQUIS 5 MG TABS tablet TAKE ONE TABLET BY MOUTH TWICE DAILY Patient taking differently: Take 5 mg by mouth 2 (two) times daily. 04/24/20  Yes Lelon Perla, MD  nitroGLYCERIN (NITROSTAT) 0.4 MG SL tablet PLACE 1 TABLET UNDER THE TONGUE EVERY 5 MINUTES IF NEEDED FOR CHEST PAIN. MAX = 3 TABS/EPISODE. Patient taking differently: Place 0.4 mg under the tongue every 5 (five) minutes as needed for chest pain. 06/10/18  Yes Josue Hector, MD  pantoprazole (PROTONIX) 40 MG tablet Take 40 mg by mouth 2 (two) times daily. 07/11/20  Yes [provider]  psyllium (HYDROCIL/METAMUCIL)  95 % PACK Take 1 packet by mouth daily. 03/11/18  Yes Hosie Poisson, MD  rosuvastatin (CRESTOR) 40 MG tablet Take 40 mg by mouth daily. 02/09/18  Yes [provider]  traMADol (ULTRAM) 50 MG tablet Take 50 mg by mouth every 6 (six) hours as needed for moderate pain.   Yes [provider]  Leuprolide Acetate 5 MG/ML KIT 0.2 ml    [provider]  potassium chloride SA (KLOR-CON) 20 MEQ tablet Take 1 tablet (20 mEq total) by mouth 2 (two) times daily for 7 days. Patient not taking: Reported on 07/21/2020 07/11/20 07/18/20  Earlie Server, MD    Inpatient Medications: Scheduled Meds: . amiodarone  200 mg Oral Daily  . amLODipine  5 mg Oral Daily  . apixaban  2.5 mg Oral BID  . furosemide  40 mg Intravenous BID  . lidocaine  1 patch Transdermal Q24H  . psyllium  1 packet Oral Daily  . rosuvastatin  40 mg  Oral Daily  . sodium chloride flush  3 mL Intravenous Q12H   Continuous Infusions: . sodium chloride     PRN Meds: sodium chloride, acetaminophen, docusate sodium, HYDROcodone-acetaminophen, sodium chloride flush  Allergies:   No Known Allergies  Social History:   Social History   Socioeconomic History  . Marital status: Married    Spouse name: Not on file  . Number of children: 3  . Years of education: Not on file  . Highest education level: Not on file  Occupational History  . Occupation: retired Psychologist, sport and exercise  Tobacco Use  . Smoking status: Never Smoker  . Smokeless tobacco: Never Used  Vaping Use  . Vaping Use: Never used  Substance and Sexual Activity  . Alcohol use: No  . Drug use: No  . Sexual activity: Not Currently  Other Topics Concern  . Not on file  Social History Narrative   Lives at home with wife. Retired Psychologist, sport and exercise.    Social Determinants of Health   Financial Resource Strain: Not on file  Food Insecurity: Not on file  Transportation Needs: Not on file  Physical Activity: Not on file  Stress: Not on file  Social Connections: Not on file  Intimate Partner Violence: Not on file    Family History:    Family History  Problem Relation Age of Onset  . CAD Father 52  . Prostate cancer Brother   . CAD Brother 64  . CAD Brother 85  . Breast cancer Neg Hx   . Colon cancer Neg Hx   . Pancreatic cancer Neg Hx      ROS:  Please see the history of present illness.   All other ROS reviewed and negative.     Physical Exam/Data:   Vitals:   07/22/20 0830 07/22/20 1109 07/22/20 1345 07/22/20 1458  BP: (!) 119/49 (!) 106/48 (!) 106/49 (!) 102/52  Pulse: (!) 50 97 (!) 49 (!) 51  Resp: '15 16 17 20  ' Temp:    (!) 97.5 F (36.4 C)  TempSrc:    Oral  SpO2: 94% 94% 93% 93%  Weight:      Height:        Intake/Output Summary (Last 24 hours) at 07/22/2020 1545 Last data filed at 07/21/2020 1753 Gross per 24 hour  Intake -  Output 600 ml  Net -600 ml   Last 3  Weights 07/21/2020 07/12/2020 01/16/2020  Weight (lbs) 225 lb 228 lb 6.4 oz 229 lb  Weight (kg) 102.059 kg 103.602 kg 103.874 kg  Body mass index is 31.38 kg/m.  General:  Well nourished, well developed, in no acute distress HEENT: sclera anicteric Lymph: no adenopathy Neck: no JVD Endocrine:  No thryomegaly Vascular: No carotid bruits; distal pulses 2+ bilaterally Cardiac:  normal S1, S2; RRR; +murmur, no rubs, or gallops  Lungs:  Faint crackles at lung bases without rhonchi or wheezing. Abd: soft, nontender, no hepatomegaly  Ext: 2+ LE edema with trace-1+ upper extremity edema Musculoskeletal:  No deformities, BUE and BLE strength normal and equal Skin: warm and dry  Neuro:  HOH, no focal abnormalities noted Psych:  Normal affect   EKG:  The EKG was personally reviewed and demonstrates:  sinus bradycardia, rate 53 bpm, chronic RBBB, non-specific T wave abnormalities. Telemetry:  Telemetry was personally reviewed and demonstrates:  Sinus bradycardia with occasional PACs  Relevant CV Studies: Echocardiogram 01/2020: 1. Left ventricular ejection fraction, by estimation, is 55 to 60%. The  left ventricle has normal function. The left ventricle has no regional  wall motion abnormalities. There is moderate concentric left ventricular  hypertrophy. Left ventricular  diastolic parameters are consistent with Grade I diastolic dysfunction  (impaired relaxation).  2. Right ventricular systolic function is normal. The right ventricular  size is normal. There is normal pulmonary artery systolic pressure.  3. Left atrial size was mildly dilated.  4. The mitral valve is grossly normal. Trivial mitral valve  regurgitation.  5. The aortic valve is tricuspid. Aortic valve regurgitation is not  visualized. Mild aortic valve stenosis.  6. Aortic dilatation noted. There is moderate dilatation of the ascending  aorta measuring 44 mm.  7. The inferior vena cava is normal in size with greater  than 50%  respiratory variability, suggesting right atrial pressure of 3 mmHg.   NST 2017:  The left ventricular ejection fraction is normal (55-65%).  Nuclear stress EF: 59%. Post CABG septal wall hypokinesis.  There was no ST segment deviation noted during stress.  Defect 1: There is a small defect of mild severity present in the apex location. There is basal inferior wall infarct. No ischemia.  This is a low risk study. No significant ischemia identified.    Laboratory Data:  High Sensitivity Troponin:   Recent Labs  Lab 08-02-2020 0504 08/02/2020 0840  TROPONINIHS 16 15     Chemistry Recent Labs  Lab 07/18/20 1310 07/20/20 1511 07/22/20 0249  NA 139 136 139  K 4.4 3.9 3.6  CL 113* 110 108  CO2 19* 17* 18*  GLUCOSE 89 112* 115*  BUN '20 16 19  ' CREATININE 2.14* 2.12* 2.17*  CALCIUM 9.0 9.2 9.5  GFRNONAA 30* 30* 29*  ANIONGAP '7 9 13    ' Recent Labs  Lab 2020/08/02 0840  PROT 6.1*  ALBUMIN 2.7*  AST 97*  ALT 65*  ALKPHOS 95  BILITOT 0.9   Hematology Recent Labs  Lab 07/20/20 1511 2020-08-02 0840 07/22/20 0249  WBC 7.1 7.7 6.2  RBC 3.08* 3.09* 3.30*  HGB 8.0* 8.1* 8.8*  HCT 27.6* 27.9* 29.8*  MCV 89.6 90.3 90.3  MCH 26.0 26.2 26.7  MCHC 29.0* 29.0* 29.5*  RDW 25.2* 25.3* 26.3*  PLT 223 225 220   BNP Recent Labs  Lab 02-Aug-2020 0613  BNP 352.2*    DDimer No results for input(s): DDIMER in the last 168 hours.   Radiology/Studies:  US RENAL  Result Date: 08-02-20 CLINICAL DATA:  87 year old male with acute renal insufficiency. Hematuria and pain. EXAM: RENAL / URINARY TRACT ULTRASOUND COMPLETE COMPARISON:  CT Abdomen  and Pelvis 07/14/2020. FINDINGS: Right Kidney: Renal measurements: 10.5 x 5.1 x 5.0 cm = volume: 140 mL. Renal cortical thinning. No hydronephrosis. Simple 4.8 cm exophytic right renal cyst redemonstrated and appears benign. No other right renal cyst is evident. Left Kidney: Renal measurements: The left kidney could not be identified by  ultrasound, obscured by bowel gas and other acoustic shadowing (image 15). The left kidney was not obstructed on 07/14/2020. Bladder: Diminutive, unremarkable. Other: None. IMPRESSION: 1. Left kidney cannot be identified by ultrasound today. 2. Stable, nonobstructed right kidney since the CT on 07/14/2020. 3. Diminutive urinary bladder. Electronically Signed   By: Genevie Ann M.D.   On: 07/21/2020 07:16   DG Chest Port 1 View  Result Date: 07/21/2020 CLINICAL DATA:  87 year old male with lower extremity swelling. Weakness. Back pain. Wheezing. EXAM: PORTABLE CHEST 1 VIEW COMPARISON:  Chest radiographs 06/26/2020 and earlier. FINDINGS: Portable AP upright view at 0509 hours. Stable cardiac size and mediastinal contours. Stable sequelae of prior CABG, left shoulder arthroplasty. Acute on chronic bilateral pulmonary interstitial opacity, basilar predominant and fairly symmetric. No superimposed pneumothorax. No pleural effusion identified. No consolidation or confluent opacity. No acute osseous abnormality identified. IMPRESSION: Mild acute on chronic pulmonary interstitial opacity. Mild interstitial edema favored over viral/atypical respiratory infection. Electronically Signed   By: Genevie Ann M.D.   On: 07/21/2020 05:28     Assessment and Plan:   1. Acute on chronic diastolic CHF: patient presented with primary complaint of right-sided back pain, though also noted LE edema for the past 1-2 weeks. BNP was elevated to 352 (no prior for comparison). CXR suggested possible opacities c/w pulmonary edema rather than atypical infection. He was started on IV lasix 50m BID with UOP of 6063mdocumented.  No weight trend. His last echocardiogram 01/2020 showed EF 55-60%, G1DD, moderate concentric LVH, and no RWMA. He continues to have 2+ LE edema and faint crackles at lung bases - Will update an echocardiogram - Agree with ongoing IV lasix 4091mID - suspect he would benefit from po lasix at discharge - Will place orders  for compression stockings - Continue to monitor strict I&Os and daily weights - Continue to monitor electrolytes closely and replete as needed to maintain K >4, Mg >2  2. CAD s/p CABG in 1993: no anginal complaints. Last ischemic evaluation was a NST in 2017 which was without ischemia. Not on aspirin given need for anticoagulation - Continue statin  3. Paroxysmal atrial fibrillation: appears to be maintaining sinus rhythm this admission. HR in predominately in the 50s, though dips to 40s at times. He has chronic anemia, though Hgb stable - Continue eliquis 2.5mg21mD for stroke ppx (dose adjusted for age and Cr >1.5) - Continue amiodarone for rhythm control  4. Aortic stenosis: mild on echo 01/2020. No symptoms to suggest significant change since that time.  - Continue routine annual monitoring.   5. HTN: BP intermittently soft but stable - Favor stopping amlodipine given concern for renal hypoperfusion and soft blood pressures despite holding AM amlodipine today.  6. HLD: no recent lipids though LDL 44 05/2019 - Continue statin  7. Iron deficiency anemia: Currently receiving iron transfusions outpatient. Hgb 8.0 on admission and stable at 8.8 today.  - Continue to monitor closely with ongoing anticoagulation use  8. AoCKD stage 3: Cr baseline appears to be 1.5 07/11/20 (up from 1.2 05/2019), though up to 2.12 on admission and 2.17 on repeat today. Renal US wKoreah normal right kidney though unable to  visualize left kidney. UCx with multiple species, recommending repeat sample. - Continue to monitor closely with ongoing diuresis  9. Back pain: etiology remains unclear.  - Continue management/work-up per primary team  Risk Assessment/Risk Scores:     New York Heart Association (NYHA) Functional Class NYHA Class II  CHA2DS2-VASc Score = 5  This indicates a 7.2% annual risk of stroke. The patient's score is based upon: CHF History: Yes HTN History: Yes Diabetes History: No Stroke  History: No Vascular Disease History: Yes Age Score: 2 Gender Score: 0     For questions or updates, please contact [place] Please consult [email or web address] for contact info under    Signed, Abigail Butts, PA-C  07/22/2020 3:45 PM   History and all data above reviewed.  Patient examined.  I agree with the findings as above.  this nice gentleman presented with back pain.   In retrospect he has had some increased foot edema recently.  Otherwise he has had no acute cardiac complaints.  He does not have SOB, PND or orthopnea.  He is limited and gets around with a walker for leg weakness, balance and back pain.  He has a therapist.  He gets weighed occasionally and has had no weight gain.  He has had a general failure to thrive since about Nov if not before.  He has been treated for an iron deficiency anemia.  He has had decreased appetite and nausea.  He has acute on chronic renal insufficiency.  He is also noted to have some elevated liver enzymes.   He does have some edema on CXR mild.  BNP was mildly elevated.   Work up for the etiology of the back pain is thus far negative.  Renal studies as above.  He does have proteinuria.   The patient exam reveals COR:RRR  ,  Lungs: Clear  ,  Abd: Positive bowel sounds, no rebound no guarding, Ext Severe edema to the thighs dependent and pitting.  Neck:  Some JVD at 45 degrees with questionable HJR.  Marland Kitchen  All available labs, radiology testing, previous records reviewed. Agree with documented assessment and plan.   Acute on chronic diastolic dysfunction:  This is part of the issue and we will check the echo to make sure there has not been a dramatic changed.  However, I suspect more of a systemic issue with his proteinuria and other abnormalities as above.  We will work on diuresis with IV Lasix, compression stockings and keeping his feet elevated.  However, I don't think HF explains the failure to thrive and abnormal numbers as above.  I suspect that it might  be the other way around.      Jeneen Rinks Deauna Yaw  5:21 PM  07/22/2020

## 2020-07-22 NOTE — Progress Notes (Signed)
Progress Note    Evan Dorsey  WNI:[id number] DOB: 08/01/1932  DOA: 07/20/2020 PCP: Merri Brunette, MD    Brief Narrative:   Chief complaint: Back pain and weakness.  Medical records reviewed and are as summarized below:  Evan Dorsey is an 87 y.o. male with a PMH of HTN, HLD, atrial fibrillation on chronic anticoagulation, CAD s/p CABG in '93, aortic stenosis nephrolithiasis, iron deficiency anemia receiving iron transfusions, rectal bleeding, prostate cancer, bladder cancer, BPH, and arthritis who presented to the ED with complaints of right-sided back pain and weakness associated with cough and intermittent wheezing in the setting of a recently treated diagnosis of pneumonia s/p Z-pack. Patient was seen at [facility] ED and diagnosed with a pinched nerve.  They prescribed him gabapentin 300mg  3 times daily, but it was self discontinued after 2 doses because it "knocked him out" and made him confused.  Over the last 1-2 weeks he reports progressive swelling of the lower extremities which is new. Upon admission to the emergency department patient was noted to be afebrile, pulse 49-59, and all other vital signs maintained.  Labs from 2/5 significant for hemoglobin 8 (hbg 9 on 1/27), BUN 16, creatinine 2.12 (Cr 1.52 on 1/27), and high-sensitivity troponin 16, COVID-19 screening negative, BNP 352.2, and CK 276.  Chest x-ray significant for mild acute on chronic pulmonary interstitial opacity with mild interstitial edema favored over respiratory infection.  Urinalysis was positive for large hemoglobin, negative leukocytes, negative nitrites, 100 protein, rare bacteria, 0-5 RBCs, and 6-10 WBCs.  Patient had been given oxycodone for pain symptoms, but wife noted that it was making him sleepy.  At bedside renal ultrasound has been performed by the ED provider which showed no signs of hydronephrosis, stones or cyst.    Assessment/Plan:   Principal Problem:   Acute on chronic diastolic CHF  (congestive heart failure) (HCC), POA 2 D Echo 02/01/20 showed EF 55-60%, grade I diastolic dysfunction, so patient likely has an exacerbation of diastolic CHF. CXR personally reviewed and showed interstitial edema. EKG personally reviewed and showed non-sustained V-tach/wide complex tachycardia. HS troponin WNL. Repeat 2 D Echo pending. Placed on a heart healthy diet/1800 cc fluid restriction and Lasix 40 mg IV BID. I/O -597. Continue diuresis. Cardiology consult pending.  Active Problems: Acute kidney injury superimposed on chronic kidney disease 3a acute on chronic hematuria, POA Patient presents with creatinine elevated up to 2.12 with BUN 16.  Creatinine had been 1.52 on 07/12/19, with acute rise of > 0.3 consistent with AKI, (creatinine noted to previously range from 1-1.2 per review of records).  Urinalysis not consistent with UTI. Bedside ultrasound did not note any signs of obstruction or stones.  Formal ultrasound was not able to identify the left kidney, and did not note any abnormalities on the right kidney.  Suspect etiology of AKI from hypoperfusion related to CHF exacerbation. F/U urine cultures and urine sodium/creatinine.  CK not elevated. Monitoring. No significant change over past 24 hours.  Iron deficiency anemia, acute on chronic, POA Hemoglobin was noted to be 8 on 07/20/20, but had previously been 9 on 07/11/20.  Work-up so far noted some hematuria.  He has been followed in outpatient setting by Dr. Cathie Hoops hematology oncology and has been on iron transfusions weekly.  He received his 3 of 4 scheduled weekly transfusions on 07/18/20.  Since being on iron infusion patient reports that stools have been a little lighter in color since no longer being on oral supplementation.  Differential includes acute blood loss vs. fluid overload/dilutional vs. Anemia of chronic disease. Checking stools for hemoccult blood.  Back pain, acute on chronic, POA Previously evaluated at [place] and reported to  have a pinched nerve at the back.  Reportedly unable to tolerate gabapentin 300 mg 3 times daily after taking 2 doses due to lethargy and confusion.  In the ER patient has been given oxycodone for pain, but noted to have made him significantly sleepy. Obtain records from work-up done at [place]. Continue Hydrocodone as needed pain. May benefit from another trial of gabapentin at 100 mg. PT to evaluate and treat given weakness and complaints of back.   Paroxysmal atrial fibrillation on chronic anticoagulation, chronic, POA Patient appears to be currently in sinus sinus bradycardia with heart rates in 40-50s. Continue amiodarone.  Hgb stable, resume Eliquis.  Prolonged QT interval, chronic, POA On admission QTC 527. On telemetry. Monitor and correct electrolytes PRN. Avoid medications that exacerbate.  CAD, chronic, POA Initial high-sensitivity troponin negative.  Patient with remote history of CABG back in 1993. Continue statin.  Essential hypertension, chronic, POA Blood pressures currently stable. Continue home medication amlodipine 5 mg daily.  Hyperlipidemia, chronic, POA Continue home medication Crestor 40 mg daily  Metabolic acidosis, acute, POA  Acute on admission: CO2 17 without elevated anion gap. Continue to monitor with diuresis.  History of prostate and transitional cell bladder cancer Patient with prior history of transitional cell bladder cancer in 2013 status post resection.  He had previously received radiation treatment with a history of prostate cancer and had been receiving Leuprolide injections. Outpatient follow-up  GERD Home medication regimen includes Protonix 40 mg twice daily. Held protonix due to prolonged QT.  Obesity  Body mass index is 31.38 kg/m.   Family Communication/Anticipated D/C date and plan/Code Status   DVT prophylaxis: apixaban (ELIQUIS) tablet 2.5 mg Start: 07/21/20 1230 Place and maintain sequential compression device Start:  07/21/20 0851 apixaban (ELIQUIS) tablet 2.5 mg   Current Level of Care:: Telemetry Cardiac Code Status: Full Code.  Family Communication: Wife called, no answer.  Message left  Disposition Plan: Status is: Inpatient  Remains inpatient appropriate because:Volume overloaded requiring IV diuresis.   Dispo: The patient is from: Home              Anticipated d/c is to: Home              Anticipated d/c date is: 2 days              Patient currently is not medically stable to d/c.   Difficult to place patient No   Medical Consultants:    Cardiology   Anti-Infectives:    None  Subjective:   States breathing is better.  "Hurts all over", but denies specific chest pain.  Upset that he is still in the ED.  States his bottom hurts.  Objective:    Vitals:   07/22/20 0430 07/22/20 0500 07/22/20 0530 07/22/20 0734  BP: (!) 126/54 133/62 (!) 129/58 (!) 126/51  Pulse: (!) 48 (!) 51 (!) 51 (!) 54  Resp: 12 14 11 16   Temp:    (!) 97.4 F (36.3 C)  TempSrc:    Oral  SpO2: 94% 94% 94% 96%  Weight:      Height:        Intake/Output Summary (Last 24 hours) at 07/22/2020 0820 Last data filed at 07/21/2020 1753 Gross per 24 hour  Intake 3 ml  Output 600 ml  Net -597 ml   Filed Weights   07/21/20 0615  Weight: 102.1 kg    Exam: General: Frail, elderly male, uncomfortable appearing. Cardiovascular: Heart sounds are bradycardic. No gallops or rubs. No murmurs. No JVD. Lungs: Diminished with a few rales. Abdomen: Soft, nontender, nondistended with normal active bowel sounds. No masses. No hepatosplenomegaly. Neurological: Alert and oriented to place. Moves all extremities 4 with equal but diminished strength. Cranial nerves II through XII grossly intact. Skin: Warm and dry. No rashes or lesions. Scattered ecchymosis/petechiae. Extremities: No clubbing or cyanosis. 3+ doughy/pitting edema. Pedal pulses 2+. Psychiatric: Mood and affect are depressed/anxious. Insight and judgment  are fair.     Data Reviewed:   I have personally reviewed following labs and imaging studies:  Labs: Labs show the following:   Basic Metabolic Panel: Recent Labs  Lab 07/18/20 1310 07/20/20 1511 07/21/20 0840 07/22/20 0249  NA 139 136  --  139  K 4.4 3.9  --  3.6  CL 113* 110  --  108  CO2 19* 17*  --  18*  GLUCOSE 89 112*  --  115*  BUN 20 16  --  19  CREATININE 2.14* 2.12*  --  2.17*  CALCIUM 9.0 9.2  --  9.5  MG  --   --  2.1  --    GFR Estimated Creatinine Clearance: 30.3 mL/min (A) (by C-G formula based on SCr of 2.17 mg/dL (H)). Liver Function Tests: Recent Labs  Lab 07/21/20 0840  AST 97*  ALT 65*  ALKPHOS 95  BILITOT 0.9  PROT 6.1*  ALBUMIN 2.7*    CBC: Recent Labs  Lab 07/20/20 1511 07/21/20 0840 07/22/20 0249  WBC 7.1 7.7 6.2  HGB 8.0* 8.1* 8.8*  HCT 27.6* 27.9* 29.8*  MCV 89.6 90.3 90.3  PLT 223 225 220   Cardiac Enzymes: Recent Labs  Lab 07/21/20 0504  CKTOTAL 276   CBG: Recent Labs  Lab 07/21/20 0403  GLUCAP 113*   Thyroid function studies: Recent Labs    07/21/20 0857  TSH 3.391   Sepsis Labs: Recent Labs  Lab 07/20/20 1511 07/21/20 0840 07/22/20 0249  PROCALCITON  --  0.23  --   WBC 7.1 7.7 6.2    Microbiology Recent Results (from the past 240 hour(s))  SARS CORONAVIRUS 2 (TAT 6-24 HRS) Nasopharyngeal Nasopharyngeal Swab     Status: None   Collection Time: 07/21/20  5:51 AM   Specimen: Nasopharyngeal Swab  Result Value Ref Range Status   SARS Coronavirus 2 NEGATIVE NEGATIVE Final    Comment: (NOTE) SARS-CoV-2 target nucleic acids are NOT DETECTED.  The SARS-CoV-2 RNA is generally detectable in upper and lower respiratory specimens during the acute phase of infection. Negative results do not preclude SARS-CoV-2 infection, do not rule out co-infections with other pathogens, and should not be used as the sole basis for treatment or other patient management decisions. Negative results must be combined with  clinical observations, patient history, and epidemiological information. The expected result is Negative.  Fact Sheet for Patients: SugarRoll.be  Fact Sheet for Healthcare Providers: https://www.woods-mathews.com/  This test is not yet approved or cleared by the [place] FDA and  has been authorized for detection and/or diagnosis of SARS-CoV-2 by FDA under an Emergency Use Authorization (EUA). This EUA will remain  in effect (meaning this test can be used) for the duration of the COVID-19 declaration under Se ction 564(b)(1) of the Act, 21 U.S.C. section 360bbb-3(b)(1), unless the authorization is terminated or revoked  sooner.  Performed at [facility], [place] [address], [place], [place] [address]   Culture, Urine     Status: Abnormal   Collection Time: 07/25/2020  8:19 AM   Specimen: Urine, Random  Result Value Ref Range Status   Specimen Description URINE, RANDOM  Final   Special Requests   Final    NONE Performed at [facility], [place] [address] [place], [place] [address]    Culture MULTIPLE SPECIES PRESENT, SUGGEST RECOLLECTION (A)  Final   Report Status 07/22/2020 FINAL  Final    Procedures and diagnostic studies:  US RENAL  Result Date: Jul 25, 2020 CLINICAL DATA:  87 year old male with acute renal insufficiency. Hematuria and pain. EXAM: RENAL / URINARY TRACT ULTRASOUND COMPLETE COMPARISON:  CT Abdomen and Pelvis 07/14/2020. FINDINGS: Right Kidney: Renal measurements: 10.5 x 5.1 x 5.0 cm = volume: 140 mL. Renal cortical thinning. No hydronephrosis. Simple 4.8 cm exophytic right renal cyst redemonstrated and appears benign. No other right renal cyst is evident. Left Kidney: Renal measurements: The left kidney could not be identified by ultrasound, obscured by bowel gas and other acoustic shadowing (image 15). The left kidney was not obstructed on 07/14/2020. Bladder: Diminutive, unremarkable. Other: None. IMPRESSION: 1.  Left kidney cannot be identified by ultrasound today. 2. Stable, nonobstructed right kidney since the CT on 07/14/2020. 3. Diminutive urinary bladder. Electronically Signed   By: Genevie Ann M.D.   On: Jul 25, 2020 07:16   DG Chest Port 1 View  Result Date: July 25, 2020 CLINICAL DATA:  87 year old male with lower extremity swelling. Weakness. Back pain. Wheezing. EXAM: PORTABLE CHEST 1 VIEW COMPARISON:  Chest radiographs 06/26/2020 and earlier. FINDINGS: Portable AP upright view at 0509 hours. Stable cardiac size and mediastinal contours. Stable sequelae of prior CABG, left shoulder arthroplasty. Acute on chronic bilateral pulmonary interstitial opacity, basilar predominant and fairly symmetric. No superimposed pneumothorax. No pleural effusion identified. No consolidation or confluent opacity. No acute osseous abnormality identified. IMPRESSION: Mild acute on chronic pulmonary interstitial opacity. Mild interstitial edema favored over viral/atypical respiratory infection. Electronically Signed   By: Genevie Ann M.D.   On: 07/25/20 05:28    Medications:   . amiodarone  200 mg Oral Daily  . amLODipine  5 mg Oral Daily  . apixaban  2.5 mg Oral BID  . furosemide  40 mg Intravenous BID  . lidocaine  1 patch Transdermal Q24H  . psyllium  1 packet Oral Daily  . rosuvastatin  40 mg Oral Daily  . sodium chloride flush  3 mL Intravenous Q12H   Continuous Infusions: . sodium chloride       LOS: 1 day   Jacquelynn Cree, MD  [facility]   [facility] How to contact the [facility] Attending or Consulting provider [place] or covering provider during after hours [place], for this patient?  1. Check the care team in [facility] and look for a) attending/consulting [facility] provider listed and b) the [facility] team listed 2. Log into [email or web address] and use 's universal password to access. If you do not have the password, please contact the hospital operator. 3. Locate the [facility] provider you are looking for under [facility] and page to a number that you can be directly reached. 4. If you still have difficulty reaching the provider, please page the [facility] (Director on Call) for the Hospitalists listed on amion for assistance.  07/22/2020, 8:20 AM

## 2020-07-23 ENCOUNTER — Telehealth: Payer: Self-pay | Admitting: Oncology

## 2020-07-23 ENCOUNTER — Inpatient Hospital Stay (HOSPITAL_COMMUNITY): Payer: Medicare Other

## 2020-07-23 DIAGNOSIS — I5033 Acute on chronic diastolic (congestive) heart failure: Secondary | ICD-10-CM | POA: Diagnosis not present

## 2020-07-23 DIAGNOSIS — E876 Hypokalemia: Secondary | ICD-10-CM

## 2020-07-23 DIAGNOSIS — D6869 Other thrombophilia: Secondary | ICD-10-CM | POA: Diagnosis present

## 2020-07-23 LAB — BASIC METABOLIC PANEL
Anion gap: 12 (ref 5–15)
BUN: 17 mg/dL (ref 8–23)
CO2: 18 mmol/L — ABNORMAL LOW (ref 22–32)
Calcium: 8.5 mg/dL — ABNORMAL LOW (ref 8.9–10.3)
Chloride: 106 mmol/L (ref 98–111)
Creatinine, Ser: 2.12 mg/dL — ABNORMAL HIGH (ref 0.61–1.24)
GFR, Estimated: 30 mL/min — ABNORMAL LOW (ref >60)
Glucose, Bld: 90 mg/dL (ref 70–99)
Potassium: 2.8 mmol/L — ABNORMAL LOW (ref 3.5–5.1)
Sodium: 136 mmol/L (ref 135–145)

## 2020-07-23 LAB — ECHOCARDIOGRAM COMPLETE
AR max vel: 2.51 cm2
AV Area VTI: 2.51 cm2
AV Area mean vel: 2.45 cm2
AV Mean grad: 11.5 mmHg
AV Peak grad: 20.6 mmHg
Ao pk vel: 2.27 m/s
Area-P 1/2: 4.89 cm2
Calc EF: 70.2 %
Height: 71 in
S' Lateral: 3.5 cm
Single Plane A2C EF: 58 %
Single Plane A4C EF: 79.9 %
Weight: 3590.85 oz

## 2020-07-23 MED ORDER — GABAPENTIN 100 MG PO CAPS
100.0000 mg | ORAL_CAPSULE | Freq: Three times a day (TID) | ORAL | Status: DC
  Administered 2020-07-23 – 2020-07-25 (×6): 100 mg via ORAL
  Filled 2020-07-23 (×6): qty 1

## 2020-07-23 MED ORDER — POTASSIUM CHLORIDE CRYS ER 20 MEQ PO TBCR
40.0000 meq | EXTENDED_RELEASE_TABLET | Freq: Two times a day (BID) | ORAL | Status: DC
  Administered 2020-07-23 – 2020-07-25 (×5): 40 meq via ORAL
  Filled 2020-07-23 (×5): qty 2

## 2020-07-23 NOTE — Progress Notes (Addendum)
Progress Note    Evan Dorsey  JKD:[id number] DOB: May 28, 1933  DOA: 07/20/2020 PCP: Deland Pretty, MD    Brief Narrative:   Chief complaint: Back pain and weakness.  Medical records reviewed and are as summarized below:  Evan Dorsey is an 87 y.o. male with a PMH of HTN, HLD, atrial fibrillation on chronic anticoagulation, CAD s/p CABG in '93, aortic stenosis nephrolithiasis, iron deficiency anemia receiving iron transfusions, rectal bleeding, prostate cancer, bladder cancer, BPH, and arthritis who presented to the ED with complaints of right-sided back pain and weakness associated with cough and intermittent wheezing in the setting of a recently treated diagnosis of pneumonia s/p Z-pack. Patient was seen at [facility] ED and diagnosed with a pinched nerve.  They prescribed him gabapentin 300mg  3 times daily, but it was self discontinued after 2 doses because it "knocked him out" and made him confused.  Over the last 1-2 weeks he reports progressive swelling of the lower extremities which is new. Upon admission to the emergency department patient was noted to be afebrile, pulse 49-59, and all other vital signs maintained.  Labs from 2/5 significant for hemoglobin 8 (hbg 9 on 1/27), BUN 16, creatinine 2.12 (Cr 1.52 on 1/27), and high-sensitivity troponin 16, COVID-19 screening negative, BNP 352.2, and CK 276.  Chest x-ray significant for mild acute on chronic pulmonary interstitial opacity with mild interstitial edema favored over respiratory infection.  Urinalysis was positive for large hemoglobin, negative leukocytes, negative nitrites, 100 protein, rare bacteria, 0-5 RBCs, and 6-10 WBCs.  Patient had been given oxycodone for pain symptoms, but wife noted that it was making him sleepy. Bedside renal ultrasound showed no signs of hydronephrosis, stones or cyst.    Assessment/Plan:   Principal Problem:   Acute on chronic diastolic CHF (congestive heart failure) ([place]), POA 2 D Echo  02/01/20 showed EF 09-98%, grade I diastolic dysfunction. CXR showed interstitial edema. EKG showed non-sustained V-tach/wide complex tachycardia. HS troponin WNL. Repeat 2 D Echo showed EF 60-65%, grade II diastolic dysfunction. Placed on a heart healthy diet/1800 cc fluid restriction and Lasix 40 mg IV BID. I/O -760. Continue diuresis. Cardiology following with recommendations to continue IV diuresis today and change to PO in the a.m.  Active Problems: Hypokalemia, acute Due to diuresis. Replete. Re-check in a.m.  Acute kidney injury superimposed on chronic kidney disease 3a acute on chronic hematuria, POA Patient presents with creatinine elevated up to 2.12 with BUN 16.  Creatinine had been 1.52 on 07/12/19, with acute rise of > 0.3 consistent with AKI, (creatinine noted to previously range from 1-1.2 per review of records).  Urinalysis not consistent with UTI. Bedside ultrasound did not note any signs of obstruction or stones.  Formal ultrasound was not able to identify the left kidney, and did not note any abnormalities on the right kidney.  Suspect etiology of AKI from hypoperfusion related to CHF exacerbation.  CK not elevated. Monitoring. No significant change over past 48 hours, stable with diuresis. F/U urine cultures. Urine sodium/creatinine never sent, and would be difficult to interpret in the face of diuresis.  Iron deficiency anemia, acute on chronic, POA Hemoglobin was noted to be 8 on 07/20/20, but had previously been 9 on 07/11/20.  Work-up so far noted some hematuria.  He has been followed in outpatient setting by Dr. Tasia Catchings hematology oncology and has been on iron transfusions weekly.  He received his 3 of 4 scheduled weekly transfusions on 07/18/20. Differential includes acute blood loss  vs. fluid overload/dilutional vs. Anemia of chronic disease. Checking stools for hemoccult blood. Stable: Hgb 8.1-->8.8.  Back pain, acute on chronic, POA Previously evaluated at [place] and reported to  have a pinched nerve at the back.  Reportedly unable to tolerate gabapentin 300 mg 3 times daily after taking 2 doses due to lethargy and confusion.  In the ER patient has been given oxycodone for pain, but noted to have made him significantly sleepy.Continue Hydrocodone as needed pain. Resume gabapentin at lower dose (100 mg TID). PT recommending home health PT, son concerned that he will not be ambulatory at home and that his wife may not be able to handle him, so inquiring about rehab options.  Will ask TOC to see.   Paroxysmal atrial fibrillation on chronic anticoagulation/acquired thrombophilia, chronic, POA Patient appears to be currently in sinus sinus bradycardia with heart rates 45-49. Continue amiodarone.  Hgb stable on Eliquis.  Prolonged QT interval, chronic, POA On admission QTC 527. On telemetry. Monitor and correct electrolytes PRN. Avoid medications that exacerbate.  CAD, chronic, POA Initial high-sensitivity troponin negative.  Patient with remote history of CABG back in 1993. Continue statin.  Essential hypertension, chronic, POA Blood pressures soft, Norvasc on hold.  Hyperlipidemia, chronic, POA Continue home medication Crestor 40 mg daily  Metabolic acidosis, acute, POA  Acute on admission: CO2 17 without elevated anion gap and 18 today. Continue to monitor with diuresis.  History of prostate and transitional cell bladder cancer Patient with prior history of transitional cell bladder cancer in 2013 status post resection.  He had previously received radiation treatment with a history of prostate cancer and had been receiving Leuprolide injections. Outpatient follow-up.  GERD Home medication regimen includes Protonix 40 mg twice daily. Held protonix due to prolonged QT.  Obesity  Body mass index is 31.3 kg/m.   Family Communication/Anticipated D/C date and plan/Code Status   DVT prophylaxis: Place TED hose Start: 07/22/20 1648 apixaban (ELIQUIS) tablet  2.5 mg Start: 07/21/20 1230 Place and maintain sequential compression device Start: 07/21/20 0851 apixaban (ELIQUIS) tablet 2.5 mg   Current Level of Care:: Telemetry Cardiac Code Status: Full Code.  Family Communication: Wife and son updated at bedside. Disposition Plan: Status is: Inpatient  Remains inpatient appropriate because:Volume overloaded requiring IV diuresis.   Dispo: The patient is from: Home              Anticipated d/c is to: Home              Anticipated d/c date is: 1 day              Patient currently is not medically stable to d/c.   Difficult to place patient No   Medical Consultants:    Cardiology   Anti-Infectives:    None  Subjective:   Worked with PT today, felt a bit stronger.  Still having back and buttocks pain at times.  No nausea or vomiting. Appetite remains poor, says it hasn't been good in a long time, but weight stable. No BMs since admission. Has some gas.  Son and wife at bedside today.  Son concerned that patient will be too weak to go home, and wife unable to provide assistance he needs due to her own frail condition.  Objective:    Vitals:   07/22/20 2120 07/23/20 0011 07/23/20 0451 07/23/20 0730  BP: (!) 110/47 (!) 115/57 (!) 106/43 (!) 106/54  Pulse: (!) 49 (!) 45 (!) 45 (!) 49  Resp: 19 18  16 20  Temp: 97.8 F (36.6 C) (!) 97.4 F (36.3 C) (!) 97.4 F (36.3 C) 97.7 F (36.5 C)  TempSrc: Oral Oral Oral Oral  SpO2: 98% 98% 96% 97%  Weight: 101.2 kg 101.8 kg    Height: 5\' 11"  (1.803 m)       Intake/Output Summary (Last 24 hours) at 07/23/2020 0731 Last data filed at 07/23/2020 0015 Gross per 24 hour  Intake 240 ml  Output 1000 ml  Net -760 ml   Filed Weights   07/21/20 0615 07/22/20 2120 07/23/20 0011  Weight: 102.1 kg 101.2 kg 101.8 kg    Exam: General: Frail elderly male in no acute distress. Cardiovascular: Heart sounds are irregular and bradycardic. No gallops or rubs. No murmurs. No JVD. Lungs: Decreased breath  sounds. No rales, rhonchi or wheezes. Abdomen: Soft, nontender, nondistended with normal active bowel sounds. No masses. No hepatosplenomegaly. Neurological: Alert, weak, non-focal. Skin: Warm and dry. No rashes or lesions. Extremities: No clubbing or cyanosis. 2+ edema. Pedal pulses 2+. TEDS hose on. Psychiatric: Mood and affect are depressed/flat. Insight and judgment are fair.    Data Reviewed:   I have personally reviewed following labs and imaging studies:  Labs: Labs show the following:   Basic Metabolic Panel: Recent Labs  Lab 07/18/20 1310 07/20/20 1511 07/21/20 0840 07/22/20 0249 07/23/20 0350  NA 139 136  --  139 136  K 4.4 3.9  --  3.6 2.8*  CL 113* 110  --  108 106  CO2 19* 17*  --  18* 18*  GLUCOSE 89 112*  --  115* 90  BUN 20 16  --  19 17  CREATININE 2.14* 2.12*  --  2.17* 2.12*  CALCIUM 9.0 9.2  --  9.5 8.5*  MG  --   --  2.1  --   --    GFR Estimated Creatinine Clearance: 31 mL/min (A) (by C-G formula based on SCr of 2.12 mg/dL (H)). Liver Function Tests: Recent Labs  Lab 07/21/20 0840  AST 97*  ALT 65*  ALKPHOS 95  BILITOT 0.9  PROT 6.1*  ALBUMIN 2.7*    CBC: Recent Labs  Lab 07/20/20 1511 07/21/20 0840 07/22/20 0249  WBC 7.1 7.7 6.2  HGB 8.0* 8.1* 8.8*  HCT 27.6* 27.9* 29.8*  MCV 89.6 90.3 90.3  PLT 223 225 220   Cardiac Enzymes: Recent Labs  Lab 07/21/20 0504  CKTOTAL 276   CBG: Recent Labs  Lab 07/21/20 0403  GLUCAP 113*   Thyroid function studies: Recent Labs    07/21/20 0857  TSH 3.391   Sepsis Labs: Recent Labs  Lab 07/20/20 1511 07/21/20 0840 07/22/20 0249  PROCALCITON  --  0.23  --   WBC 7.1 7.7 6.2    Microbiology Recent Results (from the past 240 hour(s))  SARS CORONAVIRUS 2 (TAT 6-24 HRS) Nasopharyngeal Nasopharyngeal Swab     Status: None   Collection Time: 07/21/20  5:51 AM   Specimen: Nasopharyngeal Swab  Result Value Ref Range Status   SARS Coronavirus 2 NEGATIVE NEGATIVE Final    Comment:  (NOTE) SARS-CoV-2 target nucleic acids are NOT DETECTED.  The SARS-CoV-2 RNA is generally detectable in upper and lower respiratory specimens during the acute phase of infection. Negative results do not preclude SARS-CoV-2 infection, do not rule out co-infections with other pathogens, and should not be used as the sole basis for treatment or other patient management decisions. Negative results must be combined with clinical observations, patient history, and epidemiological  information. The expected result is Negative.  Fact Sheet for Patients: SugarRoll.be  Fact Sheet for Healthcare Providers: https://www.woods-mathews.com/  This test is not yet approved or cleared by the [place] FDA and  has been authorized for detection and/or diagnosis of SARS-CoV-2 by FDA under an Emergency Use Authorization (EUA). This EUA will remain  in effect (meaning this test can be used) for the duration of the COVID-19 declaration under Se ction 564(b)(1) of the Act, 21 U.S.C. section 360bbb-3(b)(1), unless the authorization is terminated or revoked sooner.  Performed at [facility], [place] [address], [place], [place] [address]   Culture, Urine     Status: Abnormal   Collection Time: 07/21/20  8:19 AM   Specimen: Urine, Random  Result Value Ref Range Status   Specimen Description URINE, RANDOM  Final   Special Requests   Final    NONE Performed at [facility], [place] [address], [place], [place] [address]    Culture MULTIPLE SPECIES PRESENT, SUGGEST RECOLLECTION (A)  Final   Report Status 07/22/2020 FINAL  Final    Procedures and diagnostic studies:  No results found.  Medications:   . amiodarone  200 mg Oral Daily  . apixaban  2.5 mg Oral BID  . furosemide  40 mg Intravenous BID  . lidocaine  1 patch Transdermal Q24H  . psyllium  1 packet Oral Daily  . rosuvastatin  40 mg Oral Daily  . sodium chloride flush  3 mL Intravenous  Q12H   Continuous Infusions: . sodium chloride       LOS: 2 days   Jacquelynn Cree, MD  [facility]   [facility] How to contact the [facility] Attending or Consulting provider [place] or covering provider during after hours [place], for this patient?  1. Check the care team in [facility] and look for a) attending/consulting [facility] provider listed and b) the [facility] team listed 2. Log into [email or web address] and use [place]'s universal password to access. If you do not have the password, please contact the hospital operator. 3. Locate the [facility] provider you are looking for under [facility] and page to a number that you can be directly reached. 4. If you still have difficulty reaching the provider, please page the [facility] (Director on Call) for the Hospitalists listed on amion for assistance.  07/23/2020, 7:31 AM

## 2020-07-23 NOTE — Progress Notes (Signed)
Physical Therapy Treatment Patient Details Name: Evan Dorsey MRN: [id number] DOB: [birth year] Today's Date: 07/23/2020    History of Present Illness Pt is an 87 y.o. male who presents with R sided back pain and weakness along with lower extremity swelling. Pt was seen at [facility] ED and diagnosed with a pinched nerve. Pt would to have acute exacerbation of diastolic congestive heart failure, AKI superimposed on CKD stage 3a, and anemia. PMH: prostate cancer, hx of MI, anemia, HTN, gout, divurticulosis, CAD, bladder cancer, a-fib, arthritis, and aortic stenosis nephrolithiasis.    PT Comments    Pt progressing steadily towards his physical therapy goals. Session focused on seated therapeutic exercises for strengthening and functional mobility. Pt requiring minA for transfers, ambulating x 30 feet with a walker at a min guard assist level. Pt continues with back/buttocks pain. Will benefit from continued PT to progress mobility as tolerated.     Follow Up Recommendations  Home health PT;Supervision for mobility/OOB     Equipment Recommendations  3in1 (PT)    Recommendations for Other Services OT consult     Precautions / Restrictions Precautions Precautions: Fall Precaution Booklet Issued: No Restrictions Weight Bearing Restrictions: No    Mobility  Bed Mobility Overal bed mobility: Needs Assistance Bed Mobility: Sit to Supine       Sit to supine: Min assist   General bed mobility comments: MinA for LE negotiation back into bed, cues for sequencing  Transfers Overall transfer level: Needs assistance Equipment used: Rolling walker (2 wheeled) Transfers: Sit to/from Stand Sit to Stand: Min assist         General transfer comment: MinA to boost up to standing position x 2  Ambulation/Gait Ambulation/Gait assistance: Min guard Gait Distance (Feet): 30 Feet Assistive device: Rolling walker (2 wheeled) Gait Pattern/deviations: Step-through  pattern;Shuffle;Decreased stride length;Trunk flexed Gait velocity: reduced Gait velocity interpretation: <1.8 ft/sec, indicate of risk for recurrent falls General Gait Details: Cues for walker proximity, min guard for safety   Stairs             Wheelchair Mobility    Modified Rankin (Stroke Patients Only)       Balance Overall balance assessment: Needs assistance Sitting-balance support: No upper extremity supported;Feet supported Sitting balance-Leahy Scale: Fair     Standing balance support: Bilateral upper extremity supported Standing balance-Leahy Scale: Poor Standing balance comment: Reliant on UE and external support.                            Cognition Arousal/Alertness: Awake/alert Behavior During Therapy: WFL for tasks assessed/performed Overall Cognitive Status: Within Functional Limits for tasks assessed                                        Exercises General Exercises - Lower Extremity Ankle Circles/Pumps: Both;10 reps;Seated Quad Sets: Both;10 reps;Seated Long Arc Quad: Both;10 reps;Seated    General Comments        Pertinent Vitals/Pain Pain Assessment: Faces Faces Pain Scale: Hurts even more Pain Location: back, buttocks Pain Descriptors / Indicators: Discomfort;Grimacing;Guarding Pain Intervention(s): Patient requesting pain meds-RN notified    Home Living                      Prior Function            PT Goals (current goals can now  be found in the care plan section) Acute Rehab PT Goals Patient Stated Goal: to get better PT Goal Formulation: With patient/family Time For Goal Achievement: 08/04/20 Potential to Achieve Goals: Good Progress towards PT goals: Progressing toward goals    Frequency    Min 3X/week      PT Plan Current plan remains appropriate    Co-evaluation              AM-PAC PT "6 Clicks" Mobility   Outcome Measure  Help needed turning from your back to  your side while in a flat bed without using bedrails?: A Little Help needed moving from lying on your back to sitting on the side of a flat bed without using bedrails?: A Little Help needed moving to and from a bed to a chair (including a wheelchair)?: A Little Help needed standing up from a chair using your arms (e.g., wheelchair or bedside chair)?: A Little Help needed to walk in hospital room?: A Little Help needed climbing 3-5 steps with a railing? : A Lot 6 Click Score: 17    End of Session   Activity Tolerance: Patient limited by fatigue;Patient limited by pain Patient left: in bed;with call bell/phone within reach;with family/visitor present Nurse Communication: Mobility status PT Visit Diagnosis: Unsteadiness on feet (R26.81);Other abnormalities of gait and mobility (R26.89);Muscle weakness (generalized) (M62.81);Difficulty in walking, not elsewhere classified (R26.2);Pain Pain - part of body:  (back)     Time: 7001-7494 PT Time Calculation (min) (ACUTE ONLY): 28 min  Charges:  $Gait Training: 8-22 mins $Therapeutic Exercise: 8-22 mins                     Wyona Almas, PT, DPT Acute Rehabilitation Services Pager [phone] Office [phone]    Deno Etienne 07/23/2020, 11:08 AM

## 2020-07-23 NOTE — Evaluation (Signed)
Occupational Therapy Evaluation Patient Details Name: Evan Dorsey MRN: [id number] DOB: 22-Apr-1933 Today's Date: 07/23/2020    History of Present Illness Pt is an 87 y.o. male who presents with R sided back pain and weakness along with lower extremity swelling. Pt was seen at [facility] ED and diagnosed with a pinched nerve. Pt would to have acute exacerbation of diastolic congestive heart failure, AKI superimposed on CKD stage 3a, and anemia. PMH: prostate cancer, hx of MI, anemia, HTN, gout, diverticulosis, CAD, bladder cancer, a-fib, arthritis, and aortic stenosis nephrolithiasis.   Clinical Impression   Pt PTA: Pt living with family and reports independence with ADL and mobility with AD. Pt limited by decreased activity tolerance, decreased strength and pt reported that he had just returned to bed after being in recliner most of the day. Pt minA  For ADL overall and minguardA for mobility - sit to stand and taking steps to [facility].  Pt would benefit from continued OT skilled services for ADL and mobility. OT following acutely.    Follow Up Recommendations  Home health OT;Supervision - Intermittent    Equipment Recommendations  3 in 1 bedside commode    Recommendations for Other Services       Precautions / Restrictions Precautions Precautions: Fall Precaution Booklet Issued: No Precaution Comments: HOH Restrictions Weight Bearing Restrictions: No      Mobility Bed Mobility Overal bed mobility: Needs Assistance Bed Mobility: Supine to Sit;Sit to Supine     Supine to sit: Min assist Sit to supine: Min guard   General bed mobility comments: Assist for BLE maneuvering and trunk elevation; pt picking up BLEs for sitting to supine    Transfers Overall transfer level: Needs assistance Equipment used: Rolling walker (2 wheeled) Transfers: Sit to/from Stand Sit to Stand: Min guard;From elevated surface         General transfer comment: from EOB elevated    Balance  Overall balance assessment: Needs assistance Sitting-balance support: No upper extremity supported;Feet supported Sitting balance-Leahy Scale: Fair     Standing balance support: Bilateral upper extremity supported Standing balance-Leahy Scale: Poor Standing balance comment: Reliant on UE and external support.                           ADL either performed or assessed with clinical judgement   ADL Overall ADL's : Needs assistance/impaired Eating/Feeding: Set up;Sitting   Grooming: Set up;Sitting   Upper Body Bathing: Set up;Sitting   Lower Body Bathing: Minimal assistance;Sitting/lateral leans;Sit to/from stand   Upper Body Dressing : Set up;Sitting   Lower Body Dressing: Minimal assistance;Sitting/lateral leans;Sit to/from stand   Toilet Transfer: Set up;Stand-pivot   Toileting- Clothing Manipulation and Hygiene: Minimal assistance;Sitting/lateral lean;Sit to/from stand       Functional mobility during ADLs: Min guard General ADL Comments: Pt limited by decreased activity tolerance, decreased strength and pt reported that he had just returned to bed after being in recliner most of the day.     Vision Baseline Vision/History: No visual deficits Patient Visual Report: No change from baseline Vision Assessment?: No apparent visual deficits     Perception     Praxis      Pertinent Vitals/Pain Pain Assessment: Faces Faces Pain Scale: Hurts a little bit Pain Location: back, buttocks Pain Descriptors / Indicators: Discomfort Pain Intervention(s): Premedicated before session;Monitored during session;Repositioned;RN gave pain meds during session     Hand Dominance Right   Extremity/Trunk Assessment Upper Extremity Assessment Upper  Extremity Assessment: Generalized weakness   Lower Extremity Assessment Lower Extremity Assessment: Generalized weakness;RLE deficits/detail;LLE deficits/detail RLE Deficits / Details: edema 3+ LLE Deficits / Details: edema  3+   Cervical / Trunk Assessment Cervical / Trunk Assessment: Normal   Communication Communication Communication: HOH   Cognition Arousal/Alertness: Awake/alert Behavior During Therapy: WFL for tasks assessed/performed Overall Cognitive Status: Within Functional Limits for tasks assessed                                     General Comments  Pt's youngest son in room    Exercises     Shoulder Instructions      Home Living Family/patient expects to be discharged to:: Private residence Living Arrangements: Spouse/significant other Available Help at Discharge: Family;Available 24 hours/day Type of Home: House Home Access: Stairs to enter [employer] of Steps: 2 Entrance Stairs-Rails: Left Home Layout: One level     Bathroom Shower/Tub: Teacher, early years/pre: Handicapped height Bathroom Accessibility: Yes   Home Equipment: Environmental consultant - 2 wheels;Cane - quad;Shower seat;Grab bars - tub/shower          Prior Functioning/Environment Level of Independence: Independent        Comments: RW for stability;back pain weakness; independence with ADL  and receiving HH PT        OT Problem List: Decreased strength;Decreased activity tolerance;Impaired balance (sitting and/or standing);Decreased safety awareness;Pain;Cardiopulmonary status limiting activity      OT Treatment/Interventions: Self-care/ADL training;Therapeutic exercise;Energy conservation;DME and/or AE instruction;Therapeutic activities;Cognitive remediation/compensation;Patient/family education;Balance training    OT Goals(Current goals can be found in the care plan section) Acute Rehab OT Goals Patient Stated Goal: to get better OT Goal Formulation: With patient Time For Goal Achievement: 08/06/20 Potential to Achieve Goals: Good ADL Goals Pt Will Perform Grooming: with supervision;standing Pt Will Perform Lower Body Dressing: with min assist;sitting/lateral leans;sit  to/from stand;with adaptive equipment Pt Will Transfer to Toilet: with supervision;ambulating;regular height toilet Pt Will Perform Toileting - Clothing Manipulation and hygiene: with supervision;sitting/lateral leans;sit to/from stand Additional ADL Goal #1: Pt will utilize/state 3 energy conservation techniques in order to increase independence for ADL and mobility.  OT Frequency: Min 2X/week   Barriers to D/C:            Co-evaluation              AM-PAC OT "6 Clicks" Daily Activity     Outcome Measure Help from another person eating meals?: None Help from another person taking care of personal grooming?: A Little Help from another person toileting, which includes using toliet, bedpan, or urinal?: A Little Help from another person bathing (including washing, rinsing, drying)?: A Little Help from another person to put on and taking off regular upper body clothing?: A Little Help from another person to put on and taking off regular lower body clothing?: A Little 6 Click Score: 19   End of Session Nurse Communication: Mobility status  Activity Tolerance: Patient tolerated treatment well Patient left: in bed;with call bell/phone within reach;with bed alarm set  OT Visit Diagnosis: Unsteadiness on feet (R26.81);Muscle weakness (generalized) (M62.81);Pain Pain - part of body:  (back)                Time: 8786-7672 OT Time Calculation (min): 34 min Charges:  OT General Charges $OT Visit: 1 Visit OT Evaluation $OT Eval Moderate Complexity: 1 Mod OT Treatments $Self Care/Home Management : 8-22 mins  Jefferey Pica, OTR/L Acute Rehabilitation Services Pager: [phone] Office: [phone]   Jefferey Pica 07/23/2020, 5:20 PM

## 2020-07-23 NOTE — Progress Notes (Signed)
Pt stable in bed. Approximately 40% of breakfast eaten. No signs of distress noted. Pt is awake and alert, able to answer all questions appropriately and able to hold a conversation. Pt took all prescribed meds with out difficult. No other needs or concerns at this time. No questions by the patient at this time.

## 2020-07-23 NOTE — Progress Notes (Signed)
Progress Note  Patient Name: Evan Dorsey Date of Encounter: 07/23/2020  Primary Cardiologist:   [name], MD   Subjective   No new complaints.  He has pain related to a decubitus ulcer.  No SOB.    Inpatient Medications    Scheduled Meds: . amiodarone  200 mg Oral Daily  . apixaban  2.5 mg Oral BID  . furosemide  40 mg Intravenous BID  . lidocaine  1 patch Transdermal Q24H  . potassium chloride  40 mEq Oral BID  . psyllium  1 packet Oral Daily  . rosuvastatin  40 mg Oral Daily  . sodium chloride flush  3 mL Intravenous Q12H   Continuous Infusions: . sodium chloride     PRN Meds: sodium chloride, acetaminophen, docusate sodium, HYDROcodone-acetaminophen, sodium chloride flush   Vital Signs    Vitals:   07/22/20 2120 07/23/20 0011 07/23/20 0451 07/23/20 0730  BP: (!) 110/47 (!) 115/57 (!) 106/43 (!) 106/54  Pulse: (!) 49 (!) 45 (!) 45 (!) 49  Resp: 19 18 16 20   Temp: 97.8 F (36.6 C) (!) 97.4 F (36.3 C) (!) 97.4 F (36.3 C) 97.7 F (36.5 C)  TempSrc: Oral Oral Oral Oral  SpO2: 98% 98% 96% 97%  Weight: 101.2 kg 101.8 kg    Height: 5\' 11"  (1.803 m)       Intake/Output Summary (Last 24 hours) at 07/23/2020 1010 Last data filed at 07/23/2020 0086 Gross per 24 hour  Intake 600 ml  Output 1000 ml  Net -400 ml   Filed Weights   07/21/20 0615 07/22/20 2120 07/23/20 0011  Weight: 102.1 kg 101.2 kg 101.8 kg    Telemetry    Atrial fib with slow rate, possible junctional rhythm - Personally Reviewed  ECG    NA - Personally Reviewed  Physical Exam   GEN: No acute distress.   Neck: No  JVD Cardiac: Irregular RR, no murmurs, rubs, or gallops.  Respiratory: Clear  to auscultation bilaterally. GI: Soft, nontender, non-distended  MS:   Moderate diffuse leg edema; No deformity. Neuro:  Nonfocal  Psych: Normal affect   Labs    Chemistry Recent Labs  Lab 07/20/20 1511 07/21/20 0840 07/22/20 0249 07/23/20 0350  NA 136  --  139 136  K 3.9  --   3.6 2.8*  CL 110  --  108 106  CO2 17*  --  18* 18*  GLUCOSE 112*  --  115* 90  BUN 16  --  19 17  CREATININE 2.12*  --  2.17* 2.12*  CALCIUM 9.2  --  9.5 8.5*  PROT  --  6.1*  --   --   ALBUMIN  --  2.7*  --   --   AST  --  97*  --   --   ALT  --  65*  --   --   ALKPHOS  --  95  --   --   BILITOT  --  0.9  --   --   GFRNONAA 30*  --  29* 30*  ANIONGAP 9  --  13 12     Hematology Recent Labs  Lab 07/20/20 1511 07/21/20 0840 07/22/20 0249  WBC 7.1 7.7 6.2  RBC 3.08* 3.09* 3.30*  HGB 8.0* 8.1* 8.8*  HCT 27.6* 27.9* 29.8*  MCV 89.6 90.3 90.3  MCH 26.0 26.2 26.7  MCHC 29.0* 29.0* 29.5*  RDW 25.2* 25.3* 26.3*  PLT 223 225 220    Cardiac EnzymesNo  results for input(s): TROPONINI in the last 168 hours. No results for input(s): TROPIPOC in the last 168 hours.   BNP Recent Labs  Lab 07/21/20 0613  BNP 352.2*     DDimer No results for input(s): DDIMER in the last 168 hours.   Radiology    No results found.  Cardiac Studies   ECHO:  Pending  Patient Profile     87 y.o. male with a PMH of CAD s/p CABG in 6962, chronic diastolic CHF, paroxysmal atrial fibrillation on eliquis, aortic stenosis, HTN, HLD, prostate cancer, bladder cancer, iron deficiency anemia currently undergoing iron infusions, and CKD stage 3, who is being seen today for the evaluation of CHF at the request of Dr. Rockne Menghini.  Assessment & Plan    Acute on chronic diastolic HF:    Net negative 1357 liters.   Echo final result pending.  I have reviewed the images and the EF is well preserved.  I will suggest continue current diuresis today but change to PO in the AM most likely.   ACUTE ON CHRONIC RENAL INSUFFICIENCY:  Creat is elevated but tolerating current diuresis.     CAD status post CABG:  No evidence of acute ischemia.  No in patient testing is anticipated.   HYPOKALEMIA:  80 meq Kdur ordered.  I will add another 40 meq.     PAF:  Tolerates Eliquis adjusted for age and renal function.  Continue  amiodarone.    Aortic stenosis:  Mild.  No change in therapy.    HTN:    BP has been low and we have discontinued the Norvasc.      For questions or updates, please contact [place] Please consult [email or web address] for contact info under Cardiology/STEMI.   Signed, Minus Breeding, MD  07/23/2020, 10:10 AM

## 2020-07-23 NOTE — Progress Notes (Signed)
  Echocardiogram 2D Echocardiogram has been performed.  Bobbye Charleston 07/23/2020, 11:28 AM

## 2020-07-23 NOTE — Telephone Encounter (Signed)
Pt's wife Jarrett Soho) called to cancel appt on 2/11 due to pt being hospitalized.

## 2020-07-24 DIAGNOSIS — M545 Low back pain, unspecified: Secondary | ICD-10-CM | POA: Diagnosis not present

## 2020-07-24 DIAGNOSIS — I10 Essential (primary) hypertension: Secondary | ICD-10-CM | POA: Diagnosis not present

## 2020-07-24 DIAGNOSIS — J189 Pneumonia, unspecified organism: Secondary | ICD-10-CM | POA: Diagnosis not present

## 2020-07-24 DIAGNOSIS — G8929 Other chronic pain: Secondary | ICD-10-CM | POA: Diagnosis not present

## 2020-07-24 LAB — BASIC METABOLIC PANEL
Anion gap: 9 (ref 5–15)
BUN: 15 mg/dL (ref 8–23)
CO2: 22 mmol/L (ref 22–32)
Calcium: 8.6 mg/dL — ABNORMAL LOW (ref 8.9–10.3)
Chloride: 107 mmol/L (ref 98–111)
Creatinine, Ser: 2.04 mg/dL — ABNORMAL HIGH (ref 0.61–1.24)
GFR, Estimated: 31 mL/min — ABNORMAL LOW (ref >60)
Glucose, Bld: 81 mg/dL (ref 70–99)
Potassium: 3.3 mmol/L — ABNORMAL LOW (ref 3.5–5.1)
Sodium: 138 mmol/L (ref 135–145)

## 2020-07-24 MED ORDER — FUROSEMIDE 40 MG PO TABS
40.0000 mg | ORAL_TABLET | Freq: Every day | ORAL | Status: DC
  Administered 2020-07-24 – 2020-07-25 (×2): 40 mg via ORAL
  Filled 2020-07-24 (×2): qty 1

## 2020-07-24 NOTE — Plan of Care (Signed)
  Problem: Clinical Measurements: Goal: Respiratory complications will improve Outcome: Progressing   Problem: Coping: Goal: Level of anxiety will decrease Outcome: Progressing   Problem: Safety: Goal: Ability to remain free from injury will improve Outcome: Progressing   

## 2020-07-24 NOTE — Progress Notes (Signed)
PROGRESS NOTE    Evan Dorsey  EAV:[id number] DOB: Jun 14, 1933 DOA: 07/20/2020 PCP: Deland Pretty, MD   Brief Narrative: 87 year old with past medical history significant for hypertension, hyperlipidemia, A. fib on chronic anticoagulation, CAD status post CABG 1993, aortic stenosis, nephrolithiasis, iron deficiency anemia received iron transfusion, rectal bleeding, prostate cancer, bladder cancer, BPH, arthritis who presented to the ED with complaining of right side back pain and weakness associated with cough and intermittent wheezing in the setting of recently treated diagnosis of pneumonia with Z-Pak.  Patient was seen at [facility] ED and diagnosed with a pinched nerve.  He was prescribed gabapentin, which he discontinued after 2 doses.  Over the last 1 or 2 weeks he reports progressive swelling of lower extremity which is new.  In the emergency department patient was noted to be afebrile, heart rate 49-59.  Hemoglobin of 8.  Creatinine 2.1.  BNP 352 and a CK of 276.  Chest x-ray with mild acute on chronic pulmonary interstitial opacity.  Urinalysis was positive for large hemoglobin, 6-10 white blood cell.  Bedside renal ultrasound showed no signs of hydronephrosis, stone or cyst.  Cardiology was consulted, and was managed for acute on chronic diastolic heart failure exacerbation.     Assessment & Plan:   Principal Problem:   CHF (congestive heart failure) (HCC) Active Problems:   Hyperlipidemia   HYPERTENSION, BENIGN   CAD, ARTERY BYPASS GRAFT   Atrial fibrillation (HCC)   Paroxysmal atrial fibrillation (HCC)   Hypokalemia   Malignant neoplasm of prostate (HCC)   Iron deficiency anemia due to chronic blood loss   Acute kidney injury superimposed on chronic kidney disease (HCC)   Prolonged QT interval   Acquired thrombophilia ([place])  1-Acute on chronic diastolic heart failure exacerbation, POA Chest x-ray showed interstitial edema, echo showed ejection fraction 60 to 65%, grade  2 diastolic dysfunction. -Patient was treated with IV Lasix 40 mg IV twice daily -He is negative  3.3 L. -Plan to transition to oral Lasix and monitor overnight  2-Hypokalemia: Replete orally  3-AKI superimposed on CKD stage III a Acute on chronic hematuria POA Creatinine peaked to 2.1, BUN 16. Prior creatinine 1.5 on 07/12/2019. Renal ultrasound did not show hydronephrosis. Renal function remains stable on diuretics.  4-iron deficiency anemia acute on chronic, POA: Globin was 8 on 05/19/2021.  Previously 9 He follows with Dr. Daryel November hematology for weekly iron transfusion. Occult blood pending Hb   5-Back pain, Acute on chronic, POA;  -Continue with hydrocodone as needed. -Started on lower dose gabapentin -TOC consulted to discuss with family options moving forward  Paroxysmal A. fib on chronic anticoagulation/acquired thrombophilia chronic POA: Continue with amiodarone and Eliquis  Prolonged QT chronic replete potassium  CAD, chronic POA: Initial high sensitive troponin negative. History of CABG 1993.  Continue with statins  Hypertension chronic Holding  Norvasc, due to soft BP  Hyperlipidemia, chronic continue with Crestor Metabolic  acidosis, acute, POA: Continue to monitor on diuretics  Prostate and transitional cell bladder cancer: Need to follow-up with oncology as an outpatient. GERD: Protonix on hold due to prolonged QT Obesity BMI 31  Hepatitis A reactive. Will inform patient.       Estimated body mass index is 31.24 kg/m as calculated from the following:   Height as of this encounter: 5\' 11"  (1.803 m).   Weight as of this encounter: 101.6 kg.   DVT prophylaxis: eliquis Code Status: full code Family Communication: care discussed with wife Disposition Plan:  Status is:  Inpatient  Remains inpatient appropriate because:IV treatments appropriate due to intensity of illness or inability to take PO   Dispo: The patient is from: Home               Anticipated d/c is to: SNF              Anticipated d/c date is: 1 day              Patient currently is not medically stable to d/c.   Difficult to place patient No        Consultants:   Cardiology   Procedures:   None   Antimicrobials:  None.   Subjective: He is breathing better. Denies chest pain.   Objective: Vitals:   07/24/20 0019 07/24/20 0417 07/24/20 0734 07/24/20 1205  BP: (!) 101/46 (!) 103/51 (!) 123/54 (!) 100/47  Pulse: (!) 48 (!) 50 (!) 49 (!) 51  Resp: 17 19 (!) 21 (!) 21  Temp: 97.6 F (36.4 C) 98 F (36.7 C) 98 F (36.7 C) 97.7 F (36.5 C)  TempSrc: Oral Oral Oral Oral  SpO2: 96% 95% 93% 96%  Weight: 101.6 kg     Height:        Intake/Output Summary (Last 24 hours) at 07/24/2020 1448 Last data filed at 07/24/2020 1425 Gross per 24 hour  Intake 480 ml  Output 2000 ml  Net -1520 ml   Filed Weights   07/22/20 2120 07/23/20 0011 07/24/20 0019  Weight: 101.2 kg 101.8 kg 101.6 kg    Examination:  General exam: Appears calm and comfortable  Respiratory system: crackles bases.  Cardiovascular system: S1 & S2 heard, RRR. No JVD, murmurs, rubs, gallops or clicks. No pedal edema. Gastrointestinal system: Abdomen is nondistended, soft and nontender. No organomegaly or masses felt. Normal bowel sounds heard. Central nervous system: Alert and oriented. Extremities: Symmetric 5 x 5 power.   Data Reviewed: I have personally reviewed following labs and imaging studies  CBC: Recent Labs  Lab 07/20/20 1511 07/21/20 0840 07/22/20 0249  WBC 7.1 7.7 6.2  HGB 8.0* 8.1* 8.8*  HCT 27.6* 27.9* 29.8*  MCV 89.6 90.3 90.3  PLT 223 225 989   Basic Metabolic Panel: Recent Labs  Lab 07/18/20 1310 07/20/20 1511 07/21/20 0840 07/22/20 0249 07/23/20 0350 07/24/20 0254  NA 139 136  --  139 136 138  K 4.4 3.9  --  3.6 2.8* 3.3*  CL 113* 110  --  108 106 107  CO2 19* 17*  --  18* 18* 22  GLUCOSE 89 112*  --  115* 90 81  BUN 20 16  --  19 17 15    CREATININE 2.14* 2.12*  --  2.17* 2.12* 2.04*  CALCIUM 9.0 9.2  --  9.5 8.5* 8.6*  MG  --   --  2.1  --   --   --    GFR: Estimated Creatinine Clearance: 32.1 mL/min (A) (by C-G formula based on SCr of 2.04 mg/dL (H)). Liver Function Tests: Recent Labs  Lab 07/21/20 0840  AST 97*  ALT 65*  ALKPHOS 95  BILITOT 0.9  PROT 6.1*  ALBUMIN 2.7*   No results for input(s): LIPASE, AMYLASE in the last 168 hours. No results for input(s): AMMONIA in the last 168 hours. Coagulation Profile: No results for input(s): INR, PROTIME in the last 168 hours. Cardiac Enzymes: Recent Labs  Lab 07/21/20 0504  CKTOTAL 276   BNP (last 3 results) No results for input(s): PROBNP in  the last 8760 hours. HbA1C: No results for input(s): HGBA1C in the last 72 hours. CBG: Recent Labs  Lab 07/21/20 0403  GLUCAP 113*   Lipid Profile: No results for input(s): CHOL, HDL, LDLCALC, TRIG, CHOLHDL, LDLDIRECT in the last 72 hours. Thyroid Function Tests: No results for input(s): TSH, T4TOTAL, FREET4, T3FREE, THYROIDAB in the last 72 hours. Anemia Panel: No results for input(s): VITAMINB12, FOLATE, FERRITIN, TIBC, IRON, RETICCTPCT in the last 72 hours. Sepsis Labs: Recent Labs  Lab 07/21/20 0840  PROCALCITON 0.23    Recent Results (from the past 240 hour(s))  SARS CORONAVIRUS 2 (TAT 6-24 HRS) Nasopharyngeal Nasopharyngeal Swab     Status: None   Collection Time: 07/21/20  5:51 AM   Specimen: Nasopharyngeal Swab  Result Value Ref Range Status   SARS Coronavirus 2 NEGATIVE NEGATIVE Final    Comment: (NOTE) SARS-CoV-2 target nucleic acids are NOT DETECTED.  The SARS-CoV-2 RNA is generally detectable in upper and lower respiratory specimens during the acute phase of infection. Negative results do not preclude SARS-CoV-2 infection, do not rule out co-infections with other pathogens, and should not be used as the sole basis for treatment or other patient management decisions. Negative results must  be combined with clinical observations, patient history, and epidemiological information. The expected result is Negative.  Fact Sheet for Patients: SugarRoll.be  Fact Sheet for Healthcare Providers: https://www.woods-mathews.com/  This test is not yet approved or cleared by the [place] FDA and  has been authorized for detection and/or diagnosis of SARS-CoV-2 by FDA under an Emergency Use Authorization (EUA). This EUA will remain  in effect (meaning this test can be used) for the duration of the COVID-19 declaration under Se ction 564(b)(1) of the Act, 21 U.S.C. section 360bbb-3(b)(1), unless the authorization is terminated or revoked sooner.  Performed at [facility], [place] [address], [place], [place] [address]   Culture, Urine     Status: Abnormal   Collection Time: 07/21/20  8:19 AM   Specimen: Urine, Random  Result Value Ref Range Status   Specimen Description URINE, RANDOM  Final   Special Requests   Final    NONE Performed at [facility], [place] [address] [place], [place] [address]    Culture MULTIPLE SPECIES PRESENT, SUGGEST RECOLLECTION (A)  Final   Report Status 07/22/2020 FINAL  Final         Radiology Studies: ECHOCARDIOGRAM COMPLETE  Result Date: 07/23/2020    ECHOCARDIOGRAM REPORT   Patient Name:   DONEL OSOWSKI Date of Exam: 07/23/2020 Medical Rec #:  [id number]         Height:       71.0 in Accession #:    [id number]        Weight:       224.4 lb Date of Birth:  [birth year]         BSA:          2.214 m Patient Age:    48 years          BP:           106/54 mmHg Patient Gender: M                 HR:           52 bpm. Exam Location:  Inpatient Procedure: 2D Echo, Color Doppler and Cardiac Doppler Indications:    I50.30* Unspecified diastolic (congestive) heart failure  History:        Patient has  prior history of Echocardiogram examinations, most                 recent 02/01/2020. CHF, CAD, Abnormal ECG,                  Signs/Symptoms:Dizziness/Lightheadedness; Risk                 Factors:Hypertension. Cancer.  Sonographer:    [place] Referring Phys: [id number] Abigail Butts  Sonographer Comments: Technically difficult study due to poor echo windows. Image acquisition challenging due to respiratory motion. IMPRESSIONS  1. Left ventricular ejection fraction, by estimation, is 60 to 65%. The left ventricle has normal function. The left ventricle has no regional wall motion abnormalities. There is mild left ventricular hypertrophy. Left ventricular diastolic parameters are consistent with Grade II diastolic dysfunction (pseudonormalization).  2. Right ventricular systolic function is normal. The right ventricular size is moderately enlarged. There is mildly elevated pulmonary artery systolic pressure.  3. Left atrial size was mildly dilated.  4. Right atrial size was severely dilated.  5. The mitral valve is normal in structure. Trivial mitral valve regurgitation. No evidence of mitral stenosis.  6. Tricuspid valve regurgitation is mild to moderate.  7. The aortic valve is tricuspid. Aortic valve regurgitation is not visualized. Mild aortic valve stenosis.  8. Aortic dilatation noted. There is mild dilatation of the ascending aorta, measuring 43 mm.  9. The inferior vena cava is dilated in size with >50% respiratory variability, suggesting right atrial pressure of 8 mmHg. FINDINGS  Left Ventricle: Left ventricular ejection fraction, by estimation, is 60 to 65%. The left ventricle has normal function. The left ventricle has no regional wall motion abnormalities. The left ventricular internal cavity size was normal in size. There is  mild left ventricular hypertrophy. Left ventricular diastolic parameters are consistent with Grade II diastolic dysfunction (pseudonormalization). Right Ventricle: The right ventricular size is moderately enlarged. Right ventricular systolic function is normal. There is mildly elevated  pulmonary artery systolic pressure. The tricuspid regurgitant velocity is 2.80 m/s, and with an assumed right atrial pressure of 8 mmHg, the estimated right ventricular systolic pressure is 03.5 mmHg. Left Atrium: Left atrial size was mildly dilated. Right Atrium: Right atrial size was severely dilated. Pericardium: There is no evidence of pericardial effusion. Mitral Valve: The mitral valve is normal in structure. Mild mitral annular calcification. Trivial mitral valve regurgitation. No evidence of mitral valve stenosis. Tricuspid Valve: The tricuspid valve is normal in structure. Tricuspid valve regurgitation is mild to moderate. No evidence of tricuspid stenosis. Aortic Valve: The aortic valve is tricuspid. Aortic valve regurgitation is not visualized. Mild aortic stenosis is present. Aortic valve mean gradient measures 11.5 mmHg. Aortic valve peak gradient measures 20.6 mmHg. Aortic valve area, by VTI measures 2.51 cm. Pulmonic Valve: The pulmonic valve was not well visualized. Pulmonic valve regurgitation is not visualized. No evidence of pulmonic stenosis. Aorta: Aortic dilatation noted. There is mild dilatation of the ascending aorta, measuring 43 mm. Venous: The inferior vena cava is dilated in size with greater than 50% respiratory variability, suggesting right atrial pressure of 8 mmHg. IAS/Shunts: No atrial level shunt detected by color flow Doppler.  LEFT VENTRICLE PLAX 2D LVIDd:         5.00 cm     Diastology LVIDs:         3.50 cm     LV e' medial:    7.51 cm/s LV PW:  1.50 cm     LV E/e' medial:  13.6 LV IVS:        1.40 cm     LV e' lateral:   7.94 cm/s LVOT diam:     2.40 cm     LV E/e' lateral: 12.8 LV SV:         145 LV SV Index:   66 LVOT Area:     4.52 cm  LV Volumes (MOD) LV vol d, MOD A2C: 72.2 ml LV vol d, MOD A4C: 99.5 ml LV vol s, MOD A2C: 30.3 ml LV vol s, MOD A4C: 20.0 ml LV SV MOD A2C:     41.9 ml LV SV MOD A4C:     99.5 ml LV SV MOD BP:      59.4 ml RIGHT VENTRICLE              IVC RV S prime:     10.60 cm/s  IVC diam: 2.40 cm TAPSE (M-mode): 2.0 cm LEFT ATRIUM             Index       RIGHT ATRIUM           Index LA diam:        4.00 cm 1.81 cm/m  RA Area:     30.50 cm LA Vol (A2C):   64.1 ml 28.95 ml/m RA Volume:   121.00 ml 54.64 ml/m LA Vol (A4C):   72.5 ml 32.74 ml/m LA Biplane Vol: 68.3 ml 30.84 ml/m  AORTIC VALVE AV Area (Vmax):    2.51 cm AV Area (Vmean):   2.45 cm AV Area (VTI):     2.51 cm AV Vmax:           227.00 cm/s AV Vmean:          161.500 cm/s AV VTI:            0.580 m AV Peak Grad:      20.6 mmHg AV Mean Grad:      11.5 mmHg LVOT Vmax:         126.00 cm/s LVOT Vmean:        87.600 cm/s LVOT VTI:          0.321 m LVOT/AV VTI ratio: 0.55  AORTA Ao Root diam: 3.70 cm Ao Asc diam:  4.03 cm MITRAL VALVE                TRICUSPID VALVE MV Area (PHT): 4.89 cm     TR Peak grad:   31.4 mmHg MV Decel Time: 155 msec     TR Vmax:        280.00 cm/s MV E velocity: 102.00 cm/s MV A velocity: 63.00 cm/s   SHUNTS MV E/A ratio:  1.62         Systemic VTI:  0.32 m                             Systemic Diam: 2.40 cm [name] MD Electronically signed by [name] MD Signature Date/Time: 07/23/2020/1:17:38 PM    Final         Scheduled Meds: . amiodarone  200 mg Oral Daily  . apixaban  2.5 mg Oral BID  . furosemide  40 mg Oral Daily  . gabapentin  100 mg Oral TID  . lidocaine  1 patch Transdermal Q24H  . potassium chloride  40 mEq Oral BID  . psyllium  1  packet Oral Daily  . rosuvastatin  40 mg Oral Daily  . sodium chloride flush  3 mL Intravenous Q12H   Continuous Infusions: . sodium chloride       LOS: 3 days    Time spent: 35 minutes    Arlind Klingerman A Blaire Hodsdon, MD [facility]   If 7PM-7AM, please contact night-coverage [email or web address]  07/24/2020, 2:48 PM

## 2020-07-24 NOTE — Plan of Care (Signed)
  Problem: Clinical Measurements: Goal: Respiratory complications will improve Outcome: Progressing   Problem: Safety: Goal: Ability to remain free from injury will improve Outcome: Progressing   

## 2020-07-24 NOTE — Progress Notes (Signed)
Physical Therapy Treatment Patient Details Name: Evan Dorsey MRN: [id number] DOB: 18-Mar-1933 Today's Date: 07/24/2020    History of Present Illness Pt is an 87 y.o. male who presents with R sided back pain and weakness along with lower extremity swelling. Pt was seen at [facility] ED and diagnosed with a pinched nerve. Pt would to have acute exacerbation of diastolic congestive heart failure, AKI superimposed on CKD stage 3a, and anemia. PMH: prostate cancer, hx of MI, anemia, HTN, gout, divurticulosis, CAD, bladder cancer, a-fib, arthritis, and aortic stenosis nephrolithiasis.    PT Comments    Pt received in supine, sleeping but easily awoken and agreeable to therapy session with encouragement. Pt initially reported feeling tired from sitting up in chair >1 hour in AM, however demonstrates good participation and improved activity tolerance this session compared with previously, able to progress gait distance to 158ft using RW and Supervision to min guard assist for safety. Pt reports minimal fatigue at end of session but did request to get back to bed instead of chair. Encouraged pt to keep BLE elevated and ankle pumps to reduce BLE swelling and pillow placed for elevation/heel offloading. Will plan to assess stair trial next session if appropriate. Pt continues to benefit from PT services to progress toward functional mobility goals. Continue to recommend HHPT.   Follow Up Recommendations  Home health PT;Supervision for mobility/OOB     Equipment Recommendations  3in1 (PT)    Recommendations for Other Services       Precautions / Restrictions Precautions Precautions: Fall Precaution Booklet Issued: No Precaution Comments: HOH Restrictions Weight Bearing Restrictions: No    Mobility  Bed Mobility Overal bed mobility: Needs Assistance Bed Mobility: Supine to Sit;Sit to Supine     Supine to sit: Min assist Sit to supine: Min guard   General bed mobility comments: Cues for  BLE maneuvering and minA via HHA for trunk elevation; pt picking up BLEs for sitting to supine but needs LLE min guard, independent otherwise with use of bed features  Transfers Overall transfer level: Needs assistance Equipment used: Rolling walker (2 wheeled) Transfers: Sit to/from Stand Sit to Stand: Min guard;Min assist         General transfer comment: from lowest bed height, minA to min guard, cues for hand placement  Ambulation/Gait Ambulation/Gait assistance: Supervision;Min guard Gait Distance (Feet): 150 Feet (1 standing break, chair follow but did not need to sit) Assistive device: Rolling walker (2 wheeled) Gait Pattern/deviations: Step-through pattern;Shuffle;Decreased stride length;Trunk flexed Gait velocity: reduced; grossly <0.3 m/s   General Gait Details: Cues for walker proximity, min guard for safety with turns but Supervision for forward ambulation; chair follow but did not need to sit   Stairs             Wheelchair Mobility    Modified Rankin (Stroke Patients Only)       Balance Overall balance assessment: Needs assistance Sitting-balance support: No upper extremity supported;Feet supported Sitting balance-Leahy Scale: Fair     Standing balance support: Bilateral upper extremity supported Standing balance-Leahy Scale: Poor Standing balance comment: Reliant on UE and external support.                            Cognition Arousal/Alertness: Awake/alert Behavior During Therapy: WFL for tasks assessed/performed Overall Cognitive Status: Within Functional Limits for tasks assessed  General Comments: slow to initiate tasks and needs some encouragement but good following of 1-step commands, needs more time to process 2-step commands; A&O      Exercises General Exercises - Lower Extremity Ankle Circles/Pumps: AROM;Both;10 reps;Supine (encouraged him to perform hourly in bed/chair) Heel  Slides:  (pt deferred)    General Comments General comments (skin integrity, edema, etc.): pt spouse in room      Pertinent Vitals/Pain Pain Assessment: No/denies pain Pain Intervention(s): Monitored during session;Repositioned (BLE elevated with heels floated at end of session)    Home Living                      Prior Function            PT Goals (current goals can now be found in the care plan section) Acute Rehab PT Goals Patient Stated Goal: to get better PT Goal Formulation: With patient/family Time For Goal Achievement: 08/04/20 Potential to Achieve Goals: Good Progress towards PT goals: Progressing toward goals    Frequency    Min 3X/week      PT Plan Current plan remains appropriate    Co-evaluation              AM-PAC PT "6 Clicks" Mobility   Outcome Measure  Help needed turning from your back to your side while in a flat bed without using bedrails?: A Little Help needed moving from lying on your back to sitting on the side of a flat bed without using bedrails?: A Little Help needed moving to and from a bed to a chair (including a wheelchair)?: A Little Help needed standing up from a chair using your arms (e.g., wheelchair or bedside chair)?: A Little Help needed to walk in hospital room?: A Little Help needed climbing 3-5 steps with a railing? : A Little 6 Click Score: 18    End of Session Equipment Utilized During Treatment: Gait belt Activity Tolerance: Patient tolerated treatment well Patient left: in bed;with call bell/phone within reach;with bed alarm set;with family/visitor present Nurse Communication: Mobility status PT Visit Diagnosis: Unsteadiness on feet (R26.81);Other abnormalities of gait and mobility (R26.89);Muscle weakness (generalized) (M62.81);Difficulty in walking, not elsewhere classified (R26.2);Pain     Time: 0737-1062 PT Time Calculation (min) (ACUTE ONLY): 29 min  Charges:  $Gait Training: 8-22  mins $Therapeutic Activity: 8-22 mins                     Evan Pry P., PTA Acute Rehabilitation Services Pager: [phone] Office: [place] 07/24/2020, 12:03 PM

## 2020-07-24 NOTE — Progress Notes (Addendum)
Progress Note  Patient Name: Evan Dorsey Date of Encounter: 07/24/2020  [facility] Cardiologist: [name], MD   Subjective   In bed and feels better  Inpatient Medications    Scheduled Meds: . amiodarone  200 mg Oral Daily  . apixaban  2.5 mg Oral BID  . furosemide  40 mg Intravenous BID  . gabapentin  100 mg Oral TID  . lidocaine  1 patch Transdermal Q24H  . potassium chloride  40 mEq Oral BID  . psyllium  1 packet Oral Daily  . rosuvastatin  40 mg Oral Daily  . sodium chloride flush  3 mL Intravenous Q12H   Continuous Infusions: . sodium chloride     PRN Meds: sodium chloride, acetaminophen, docusate sodium, HYDROcodone-acetaminophen, sodium chloride flush   Vital Signs    Vitals:   07/23/20 2055 07/24/20 0019 07/24/20 0417 07/24/20 0734  BP: (!) 114/52 (!) 101/46 (!) 103/51 (!) 123/54  Pulse: (!) 54 (!) 48 (!) 50 (!) 49  Resp: 18 17 19  (!) 21  Temp: (!) 97.4 F (36.3 C) 97.6 F (36.4 C) 98 F (36.7 C) 98 F (36.7 C)  TempSrc: Oral Oral Oral Oral  SpO2: 95% 96% 95% 93%  Weight:  101.6 kg    Height:        Intake/Output Summary (Last 24 hours) at 07/24/2020 0943 Last data filed at 07/24/2020 0420 Gross per 24 hour  Intake 240 ml  Output 2100 ml  Net -1860 ml   Last 3 Weights 07/24/2020 07/23/2020 07/22/2020  Weight (lbs) 223 lb 15.8 oz 224 lb 6.9 oz 223 lb 1.7 oz  Weight (kg) 101.6 kg 101.8 kg 101.2 kg      Telemetry    Mostly SB 46 to 50s - Personally Reviewed  ECG    No new - Personally Reviewed  Physical Exam   GEN: No acute distress. In bed no complaints Neck: No JVD  Cardiac: RRR,  no murmurs, rubs, or gallops.  Respiratory: clear to auscultation bilaterally. GI: Soft, nontender, non-distended  MS: + edema of feet; No deformity. Neuro:  Nonfocal  Psych: Normal affect   Labs    High Sensitivity Troponin:   Recent Labs  Lab 07/21/20 0504 07/21/20 0840  TROPONINIHS 16 15      Chemistry Recent Labs  Lab  07/21/20 0840 07/22/20 0249 07/23/20 0350 07/24/20 0254  NA  --  139 136 138  K  --  3.6 2.8* 3.3*  CL  --  108 106 107  CO2  --  18* 18* 22  GLUCOSE  --  115* 90 81  BUN  --  19 17 15   CREATININE  --  2.17* 2.12* 2.04*  CALCIUM  --  9.5 8.5* 8.6*  PROT 6.1*  --   --   --   ALBUMIN 2.7*  --   --   --   AST 97*  --   --   --   ALT 65*  --   --   --   ALKPHOS 95  --   --   --   BILITOT 0.9  --   --   --   GFRNONAA  --  29* 30* 31*  ANIONGAP  --  13 12 9      Hematology Recent Labs  Lab 07/20/20 1511 07/21/20 0840 07/22/20 0249  WBC 7.1 7.7 6.2  RBC 3.08* 3.09* 3.30*  HGB 8.0* 8.1* 8.8*  HCT 27.6* 27.9* 29.8*  MCV 89.6 90.3 90.3  MCH 26.0 26.2 26.7  MCHC 29.0* 29.0* 29.5*  RDW 25.2* 25.3* 26.3*  PLT 223 225 220    BNP Recent Labs  Lab 07/21/20 0613  BNP 352.2*     DDimer No results for input(s): DDIMER in the last 168 hours.   Radiology    ECHOCARDIOGRAM COMPLETE  Result Date: 07/23/2020    ECHOCARDIOGRAM REPORT   Patient Name:   Evan Dorsey Date of Exam: 07/23/2020 Medical Rec #:  [id number]         Height:       71.0 in Accession #:    [id number]        Weight:       224.4 lb Date of Birth:  1933/05/29         BSA:          2.214 m Patient Age:    87 years          BP:           106/54 mmHg Patient Gender: M                 HR:           52 bpm. Exam Location:  Inpatient Procedure: 2D Echo, Color Doppler and Cardiac Doppler Indications:    I50.30* Unspecified diastolic (congestive) heart failure  History:        Patient has prior history of Echocardiogram examinations, most                 recent 02/01/2020. CHF, CAD, Abnormal ECG,                 Signs/Symptoms:Dizziness/Lightheadedness; Risk                 Factors:Hypertension. Cancer.  Sonographer:    [facility] [place] Referring Phys: [id number] Abigail Butts  Sonographer Comments: Technically difficult study due to poor echo windows. Image acquisition challenging due to respiratory motion. IMPRESSIONS  1. Left  ventricular ejection fraction, by estimation, is 60 to 65%. The left ventricle has normal function. The left ventricle has no regional wall motion abnormalities. There is mild left ventricular hypertrophy. Left ventricular diastolic parameters are consistent with Grade II diastolic dysfunction (pseudonormalization).  2. Right ventricular systolic function is normal. The right ventricular size is moderately enlarged. There is mildly elevated pulmonary artery systolic pressure.  3. Left atrial size was mildly dilated.  4. Right atrial size was severely dilated.  5. The mitral valve is normal in structure. Trivial mitral valve regurgitation. No evidence of mitral stenosis.  6. Tricuspid valve regurgitation is mild to moderate.  7. The aortic valve is tricuspid. Aortic valve regurgitation is not visualized. Mild aortic valve stenosis.  8. Aortic dilatation noted. There is mild dilatation of the ascending aorta, measuring 43 mm.  9. The inferior vena cava is dilated in size with >50% respiratory variability, suggesting right atrial pressure of 8 mmHg. FINDINGS  Left Ventricle: Left ventricular ejection fraction, by estimation, is 60 to 65%. The left ventricle has normal function. The left ventricle has no regional wall motion abnormalities. The left ventricular internal cavity size was normal in size. There is  mild left ventricular hypertrophy. Left ventricular diastolic parameters are consistent with Grade II diastolic dysfunction (pseudonormalization). Right Ventricle: The right ventricular size is moderately enlarged. Right ventricular systolic function is normal. There is mildly elevated pulmonary artery systolic pressure. The tricuspid regurgitant velocity is 2.80 m/s, and with an assumed right atrial pressure of 8  mmHg, the estimated right ventricular systolic pressure is 47.0 mmHg. Left Atrium: Left atrial size was mildly dilated. Right Atrium: Right atrial size was severely dilated. Pericardium: There is no  evidence of pericardial effusion. Mitral Valve: The mitral valve is normal in structure. Mild mitral annular calcification. Trivial mitral valve regurgitation. No evidence of mitral valve stenosis. Tricuspid Valve: The tricuspid valve is normal in structure. Tricuspid valve regurgitation is mild to moderate. No evidence of tricuspid stenosis. Aortic Valve: The aortic valve is tricuspid. Aortic valve regurgitation is not visualized. Mild aortic stenosis is present. Aortic valve mean gradient measures 11.5 mmHg. Aortic valve peak gradient measures 20.6 mmHg. Aortic valve area, by VTI measures 2.51 cm. Pulmonic Valve: The pulmonic valve was not well visualized. Pulmonic valve regurgitation is not visualized. No evidence of pulmonic stenosis. Aorta: Aortic dilatation noted. There is mild dilatation of the ascending aorta, measuring 43 mm. Venous: The inferior vena cava is dilated in size with greater than 50% respiratory variability, suggesting right atrial pressure of 8 mmHg. IAS/Shunts: No atrial level shunt detected by color flow Doppler.  LEFT VENTRICLE PLAX 2D LVIDd:         5.00 cm     Diastology LVIDs:         3.50 cm     LV e' medial:    7.51 cm/s LV PW:         1.50 cm     LV E/e' medial:  13.6 LV IVS:        1.40 cm     LV e' lateral:   7.94 cm/s LVOT diam:     2.40 cm     LV E/e' lateral: 12.8 LV SV:         145 LV SV Index:   66 LVOT Area:     4.52 cm  LV Volumes (MOD) LV vol d, MOD A2C: 72.2 ml LV vol d, MOD A4C: 99.5 ml LV vol s, MOD A2C: 30.3 ml LV vol s, MOD A4C: 20.0 ml LV SV MOD A2C:     41.9 ml LV SV MOD A4C:     99.5 ml LV SV MOD BP:      59.4 ml RIGHT VENTRICLE             IVC RV S prime:     10.60 cm/s  IVC diam: 2.40 cm TAPSE (M-mode): 2.0 cm LEFT ATRIUM             Index       RIGHT ATRIUM           Index LA diam:        4.00 cm 1.81 cm/m  RA Area:     30.50 cm LA Vol (A2C):   64.1 ml 28.95 ml/m RA Volume:   121.00 ml 54.64 ml/m LA Vol (A4C):   72.5 ml 32.74 ml/m LA Biplane Vol: 68.3 ml  30.84 ml/m  AORTIC VALVE AV Area (Vmax):    2.51 cm AV Area (Vmean):   2.45 cm AV Area (VTI):     2.51 cm AV Vmax:           227.00 cm/s AV Vmean:          161.500 cm/s AV VTI:            0.580 m AV Peak Grad:      20.6 mmHg AV Mean Grad:      11.5 mmHg LVOT Vmax:         126.00  cm/s LVOT Vmean:        87.600 cm/s LVOT VTI:          0.321 m LVOT/AV VTI ratio: 0.55  AORTA Ao Root diam: 3.70 cm Ao Asc diam:  4.03 cm MITRAL VALVE                TRICUSPID VALVE MV Area (PHT): 4.89 cm     TR Peak grad:   31.4 mmHg MV Decel Time: 155 msec     TR Vmax:        280.00 cm/s MV E velocity: 102.00 cm/s MV A velocity: 63.00 cm/s   SHUNTS MV E/A ratio:  1.62         Systemic VTI:  0.32 m                             Systemic Diam: 2.40 cm [name] MD Electronically signed by [name] MD Signature Date/Time: 07/23/2020/1:17:38 PM    Final     Cardiac Studies   Echo 07/23/20 IMPRESSIONS    1. Left ventricular ejection fraction, by estimation, is 60 to 65%. The  left ventricle has normal function. The left ventricle has no regional  wall motion abnormalities. There is mild left ventricular hypertrophy.  Left ventricular diastolic parameters  are consistent with Grade II diastolic dysfunction (pseudonormalization).  2. Right ventricular systolic function is normal. The right ventricular  size is moderately enlarged. There is mildly elevated pulmonary artery  systolic pressure.  3. Left atrial size was mildly dilated.  4. Right atrial size was severely dilated.  5. The mitral valve is normal in structure. Trivial mitral valve  regurgitation. No evidence of mitral stenosis.  6. Tricuspid valve regurgitation is mild to moderate.  7. The aortic valve is tricuspid. Aortic valve regurgitation is not  visualized. Mild aortic valve stenosis.  8. Aortic dilatation noted. There is mild dilatation of the ascending  aorta, measuring 43 mm.  9. The inferior vena cava is dilated in size with >50%  respiratory  variability, suggesting right atrial pressure of 8 mmHg.   FINDINGS  Left Ventricle: Left ventricular ejection fraction, by estimation, is 60  to 65%. The left ventricle has normal function. The left ventricle has no  regional wall motion abnormalities. The left ventricular internal cavity  size was normal in size. There is  mild left ventricular hypertrophy. Left ventricular diastolic parameters  are consistent with Grade II diastolic dysfunction (pseudonormalization).   Right Ventricle: The right ventricular size is moderately enlarged. Right  ventricular systolic function is normal. There is mildly elevated  pulmonary artery systolic pressure. The tricuspid regurgitant velocity is  2.80 m/s, and with an assumed right  atrial pressure of 8 mmHg, the estimated right ventricular systolic  pressure is 67.3 mmHg.   Left Atrium: Left atrial size was mildly dilated.   Right Atrium: Right atrial size was severely dilated.   Pericardium: There is no evidence of pericardial effusion.   Mitral Valve: The mitral valve is normal in structure. Mild mitral annular  calcification. Trivial mitral valve regurgitation. No evidence of mitral  valve stenosis.   Tricuspid Valve: The tricuspid valve is normal in structure. Tricuspid  valve regurgitation is mild to moderate. No evidence of tricuspid  stenosis.   Aortic Valve: The aortic valve is tricuspid. Aortic valve regurgitation is  not visualized. Mild aortic stenosis is present. Aortic valve mean  gradient measures 11.5 mmHg. Aortic valve peak  gradient measures 20.6  mmHg. Aortic valve area, by VTI measures  2.51 cm.   Pulmonic Valve: The pulmonic valve was not well visualized. Pulmonic valve  regurgitation is not visualized. No evidence of pulmonic stenosis.   Aorta: Aortic dilatation noted. There is mild dilatation of the ascending  aorta, measuring 43 mm.   Venous: The inferior vena cava is dilated in size with greater  than 50%  respiratory variability, suggesting right atrial pressure of 8 mmHg.   IAS/Shunts: No atrial level shunt detected by color flow Doppler.   Patient Profile     87 y.o. male with a PMH of CAD s/p CABG in 6283, chronic diastolic CHF, paroxysmal atrial fibrillation on eliquis, aortic stenosis, HTN, HLD, prostate cancer, bladder cancer, iron deficiency anemia currently undergoing iron infusions, and CKD stage 3,now admitted with chf.  Assessment & Plan    Acute on chronic diastolic HF:    Net negative 2.8 liters.   wt down from 102.1 Kg to 101.6 kg.  Echo with EF 60-65%, no RWMA, mild LVH and G2DD.   could switch to po lasix  Was not on diuretic at home.   ACUTE ON CHRONIC RENAL INSUFFICIENCY:  Creat is elevated but tolerating current diuresis.   2.17>2.12>2.04  (usual cr 1.21 to 1.26 though in jan 2022 was 1.52)   CAD status post CABG:  No evidence of acute ischemia.  No in patient testing is anticipated.  no chest pain  HYPOKALEMIA:  improved from 2.8 yesterday to 3.3 today with replacement. Marland Kitchen   on 40 meq BID  monitor  PAF:  Tolerates Eliquis adjusted for age and renal function.  Continue amiodarone.   -maintaining SB 47 to 56 monitor amiodarone is only rate slowing medicaiton  Aortic stenosis:  Mild.  No change in therapy.    HTN:    BP has been low and we have discontinued the Norvasc.  now 103/51 to 123/54   For questions or updates, please contact [place] Please consult [email or web address] for contact info under      Signed, Cecilie Kicks, NP  07/24/2020, 9:43 AM    History and all data above reviewed.  Patient examined.  I agree with the findings as above. He is feeling better.  Back pain is improved and he ambulated.   The patient exam reveals COR:RRR  ,  Lungs: Clear  ,  Abd: Positive bowel sounds, no rebound no guarding, Ext Mild edema  .  All available labs, radiology testing, previous records reviewed. Agree with documented assessment and plan.   Acute on  chronic diastolic dysfunction:  Ok to change to PO diuretic.  I would suggest sending him home with a low dose of PO Lasix.   Otherwise, no change in therapy or further testing needed.     Jeneen Rinks Evan Dorsey  11:42 AM  07/24/2020

## 2020-07-25 LAB — BASIC METABOLIC PANEL
Anion gap: 12 (ref 5–15)
BUN: 17 mg/dL (ref 8–23)
CO2: 19 mmol/L — ABNORMAL LOW (ref 22–32)
Calcium: 8.7 mg/dL — ABNORMAL LOW (ref 8.9–10.3)
Chloride: 106 mmol/L (ref 98–111)
Creatinine, Ser: 2.24 mg/dL — ABNORMAL HIGH (ref 0.61–1.24)
GFR, Estimated: 28 mL/min — ABNORMAL LOW (ref >60)
Glucose, Bld: 99 mg/dL (ref 70–99)
Potassium: 4.3 mmol/L (ref 3.5–5.1)
Sodium: 137 mmol/L (ref 135–145)

## 2020-07-25 MED ORDER — APIXABAN 2.5 MG PO TABS: 2.5000 mg | ORAL_TABLET | Freq: Two times a day (BID) | ORAL | 3 refills | Status: AC

## 2020-07-25 MED ORDER — LIDOCAINE 5 % EX PTCH
1.0000 | MEDICATED_PATCH | CUTANEOUS | 0 refills | Status: AC
Start: 2020-07-25 — End: ?

## 2020-07-25 MED ORDER — POTASSIUM CHLORIDE CRYS ER 20 MEQ PO TBCR: 20.0000 meq | EXTENDED_RELEASE_TABLET | Freq: Every day | ORAL | 0 refills | Status: AC

## 2020-07-25 MED ORDER — FUROSEMIDE 40 MG PO TABS: 40.0000 mg | ORAL_TABLET | Freq: Every day | ORAL | 3 refills | Status: AC

## 2020-07-25 MED ORDER — GABAPENTIN 100 MG PO CAPS
100.0000 mg | ORAL_CAPSULE | Freq: Three times a day (TID) | ORAL | 0 refills | Status: AC
Start: 2020-07-25 — End: ?

## 2020-07-25 NOTE — Discharge Summary (Signed)
Physician Discharge Summary  Evan Dorsey TZG:[id number] DOB: October 12, 1932 DOA: 07/20/2020  PCP: Deland Pretty, MD  Admit date: 07/20/2020 Discharge date: 07/25/2020  Admitted From: Home  Disposition: Home   Recommendations for Outpatient Follow-up:  1. Follow up with PCP in 1-2 weeks 2. Please obtain BMP/CBC in one week 3. Needs renal function check, adjust lasix as needed.  4. Needs repeat LFT, due to recent hepatitis A>   Home Health: yes.   Discharge Condition: Stable.  CODE STATUS: Full code Diet recommendation: Heart Healthy  Brief/Interim Summary: 87 year old with past medical history significant for hypertension, hyperlipidemia, A. fib on chronic anticoagulation, CAD status post CABG 1993, aortic stenosis, nephrolithiasis, iron deficiency anemia received iron transfusion, rectal bleeding, prostate cancer, bladder cancer, BPH, arthritis who presented to the ED with complaining of right side back pain and weakness associated with cough and intermittent wheezing in the setting of recently treated diagnosis of pneumonia with Z-Pak.  Patient was seen at [facility] ED and diagnosed with a pinched nerve.  He was prescribed gabapentin, which he discontinued after 2 doses.  Over the last 1 or 2 weeks he reports progressive swelling of lower extremity which is new.  In the emergency department patient was noted to be afebrile, heart rate 49-59.  Hemoglobin of 8.  Creatinine 2.1.  BNP 352 and a CK of 276.  Chest x-ray with mild acute on chronic pulmonary interstitial opacity.  Urinalysis was positive for large hemoglobin, 6-10 white blood cell.  Bedside renal ultrasound showed no signs of hydronephrosis, stone or cyst.  Cardiology was consulted, and was managed for acute on chronic diastolic heart failure exacerbation.   1-Acute on chronic diastolic heart failure exacerbation, POA Chest x-ray showed interstitial edema, echo showed ejection fraction 60 to 75%, grade 2 diastolic  dysfunction. -Patient was treated with IV Lasix 40 mg IV twice daily -He is negative  3.3 L. -transition to oral lasix.  Stable for discharge.  Needs close follow up with cardiology    2-AKI superimposed on CKD stage III a Acute on chronic hematuria POA Creatinine peaked to 2.1, BUN 16. Prior creatinine 1.5 on 07/12/2019. Renal ultrasound did not show hydronephrosis. Renal function remains stable on diuretics.  3-iron deficiency anemia acute on chronic, POA: Globin was 8 on 05/19/2021.  Previously 9 He follows with Dr. Daryel November hematology for weekly iron transfusion. Needs out patient evaluation.  Hb stable.   4-Back pain, Acute on chronic, POA;  -Continue with hydrocodone as needed. -Started on lower dose gabapentin   Paroxysmal A. fib on chronic anticoagulation/acquired thrombophilia chronic POA: Continue with amiodarone and Eliquis  Prolonged QT chronic Replaced  potassium  CAD, chronic POA: Initial high sensitive troponin negative. History of CABG 1993.  Continue with statins  Hypertension chronic Holding  Norvasc, due to soft BP  Hyperlipidemia, chronic continue with Crestor Metabolic  acidosis, acute, POA: Continue to monitor on diuretics  Prostate and transitional cell bladder cancer: Need to follow-up with oncology as an outpatient. GERD: Protonix on hold due to prolonged QT Obesity BMI 31  Hepatitis A positive Support care.  Needs follow up LFT  Discharge Diagnoses:  Principal Problem:   CHF (congestive heart failure) (HCC) Active Problems:   Hyperlipidemia   HYPERTENSION, BENIGN   CAD, ARTERY BYPASS GRAFT   Atrial fibrillation (HCC)   Paroxysmal atrial fibrillation (HCC)   Hypokalemia   Malignant neoplasm of prostate (HCC)   Iron deficiency anemia due to chronic blood loss   Acute kidney injury superimposed on  chronic kidney disease (HCC)   Prolonged QT interval   Acquired thrombophilia [facility])    Discharge Instructions  Discharge  Instructions    Diet - low sodium heart healthy   Complete by: As directed    Increase activity slowly   Complete by: As directed      Allergies as of 07/25/2020   No Known Allergies     Medication List    STOP taking these medications   amLODipine 5 MG tablet Commonly known as: NORVASC   pantoprazole 40 MG tablet Commonly known as: PROTONIX     TAKE these medications   acetaminophen 500 MG tablet Commonly known as: TYLENOL Take 500 mg by mouth every 6 (six) hours as needed for moderate pain, headache or fever.   amiodarone 200 MG tablet Commonly known as: PACERONE TAKE ONE TABLET EVERY DAY   apixaban 2.5 MG Tabs tablet Commonly known as: ELIQUIS Take 1 tablet (2.5 mg total) by mouth 2 (two) times daily. What changed:   medication strength  how much to take   calcium carbonate 500 MG chewable tablet Commonly known as: TUMS - dosed in mg elemental calcium Chew 2 tablets by mouth daily.   cholecalciferol 1000 units tablet Commonly known as: VITAMIN D Take 1,000 Units by mouth daily.   docusate sodium 100 MG capsule Commonly known as: Colace Take 1 capsule (100 mg total) by mouth daily. Do not take if you have loose stools What changed:   when to take this  reasons to take this  additional instructions   furosemide 40 MG tablet Commonly known as: LASIX Take 1 tablet (40 mg total) by mouth daily. Start taking on: Aug 25, 2020   gabapentin 100 MG capsule Commonly known as: NEURONTIN Take 1 capsule (100 mg total) by mouth 3 (three) times daily.   Leuprolide Acetate 5 MG/ML Kit 0.2 ml   lidocaine 5 % Commonly known as: LIDODERM Place 1 patch onto the skin daily. Remove & Discard patch within 12 hours or as directed by MD   nitroGLYCERIN 0.4 MG SL tablet Commonly known as: Nitrostat PLACE 1 TABLET UNDER THE TONGUE EVERY 5 MINUTES IF NEEDED FOR CHEST PAIN. MAX = 3 TABS/EPISODE. What changed:   how much to take  how to take this  when to  take this  reasons to take this  additional instructions   potassium chloride SA 20 MEQ tablet Commonly known as: KLOR-CON Take 1 tablet (20 mEq total) by mouth daily. What changed: when to take this   psyllium 95 % Pack Commonly known as: HYDROCIL/METAMUCIL Take 1 packet by mouth daily.   rosuvastatin 40 MG tablet Commonly known as: CRESTOR Take 40 mg by mouth daily.   traMADol 50 MG tablet Commonly known as: ULTRAM Take 50 mg by mouth every 6 (six) hours as needed for moderate pain.       No Known Allergies  Consultations:  Cardiology    Procedures/Studies: US RENAL  Result Date: 07/21/2020 CLINICAL DATA:  87 year old male with acute renal insufficiency. Hematuria and pain. EXAM: RENAL / URINARY TRACT ULTRASOUND COMPLETE COMPARISON:  CT Abdomen and Pelvis 07/14/2020. FINDINGS: Right Kidney: Renal measurements: 10.5 x 5.1 x 5.0 cm = volume: 140 mL. Renal cortical thinning. No hydronephrosis. Simple 4.8 cm exophytic right renal cyst redemonstrated and appears benign. No other right renal cyst is evident. Left Kidney: Renal measurements: The left kidney could not be identified by ultrasound, obscured by bowel gas and other acoustic shadowing (image 15). The left  kidney was not obstructed on 07/14/2020. Bladder: Diminutive, unremarkable. Other: None. IMPRESSION: 1. Left kidney cannot be identified by ultrasound today. 2. Stable, nonobstructed right kidney since the CT on 07/14/2020. 3. Diminutive urinary bladder. Electronically Signed   By: Genevie Ann M.D.   On: 07/21/2020 07:16   DG Chest Port 1 View  Result Date: 07/21/2020 CLINICAL DATA:  87 year old male with lower extremity swelling. Weakness. Back pain. Wheezing. EXAM: PORTABLE CHEST 1 VIEW COMPARISON:  Chest radiographs 06/26/2020 and earlier. FINDINGS: Portable AP upright view at 0509 hours. Stable cardiac size and mediastinal contours. Stable sequelae of prior CABG, left shoulder arthroplasty. Acute on chronic bilateral  pulmonary interstitial opacity, basilar predominant and fairly symmetric. No superimposed pneumothorax. No pleural effusion identified. No consolidation or confluent opacity. No acute osseous abnormality identified. IMPRESSION: Mild acute on chronic pulmonary interstitial opacity. Mild interstitial edema favored over viral/atypical respiratory infection. Electronically Signed   By: Genevie Ann M.D.   On: 07/21/2020 05:28   ECHOCARDIOGRAM COMPLETE  Result Date: 07/23/2020    ECHOCARDIOGRAM REPORT   Patient Name:   MARQUEE FUCHS Date of Exam: 07/23/2020 Medical Rec #:  [id number]         Height:       71.0 in Accession #:    [id number]        Weight:       224.4 lb Date of Birth:  1932-11-03         BSA:          2.214 m Patient Age:    87 years          BP:           106/54 mmHg Patient Gender: M                 HR:           52 bpm. Exam Location:  Inpatient Procedure: 2D Echo, Color Doppler and Cardiac Doppler Indications:    I50.30* Unspecified diastolic (congestive) heart failure  History:        Patient has prior history of Echocardiogram examinations, most                 recent 02/01/2020. CHF, CAD, Abnormal ECG,                 Signs/Symptoms:Dizziness/Lightheadedness; Risk                 Factors:Hypertension. Cancer.  Sonographer:    Jennings Referring Phys: [id number] Abigail Butts  Sonographer Comments: Technically difficult study due to poor echo windows. Image acquisition challenging due to respiratory motion. IMPRESSIONS  1. Left ventricular ejection fraction, by estimation, is 60 to 65%. The left ventricle has normal function. The left ventricle has no regional wall motion abnormalities. There is mild left ventricular hypertrophy. Left ventricular diastolic parameters are consistent with Grade II diastolic dysfunction (pseudonormalization).  2. Right ventricular systolic function is normal. The right ventricular size is moderately enlarged. There is mildly elevated pulmonary artery systolic  pressure.  3. Left atrial size was mildly dilated.  4. Right atrial size was severely dilated.  5. The mitral valve is normal in structure. Trivial mitral valve regurgitation. No evidence of mitral stenosis.  6. Tricuspid valve regurgitation is mild to moderate.  7. The aortic valve is tricuspid. Aortic valve regurgitation is not visualized. Mild aortic valve stenosis.  8. Aortic dilatation noted. There is mild dilatation of the ascending aorta, measuring 43 mm.  9.  The inferior vena cava is dilated in size with >50% respiratory variability, suggesting right atrial pressure of 8 mmHg. FINDINGS  Left Ventricle: Left ventricular ejection fraction, by estimation, is 60 to 65%. The left ventricle has normal function. The left ventricle has no regional wall motion abnormalities. The left ventricular internal cavity size was normal in size. There is  mild left ventricular hypertrophy. Left ventricular diastolic parameters are consistent with Grade II diastolic dysfunction (pseudonormalization). Right Ventricle: The right ventricular size is moderately enlarged. Right ventricular systolic function is normal. There is mildly elevated pulmonary artery systolic pressure. The tricuspid regurgitant velocity is 2.80 m/s, and with an assumed right atrial pressure of 8 mmHg, the estimated right ventricular systolic pressure is 43.3 mmHg. Left Atrium: Left atrial size was mildly dilated. Right Atrium: Right atrial size was severely dilated. Pericardium: There is no evidence of pericardial effusion. Mitral Valve: The mitral valve is normal in structure. Mild mitral annular calcification. Trivial mitral valve regurgitation. No evidence of mitral valve stenosis. Tricuspid Valve: The tricuspid valve is normal in structure. Tricuspid valve regurgitation is mild to moderate. No evidence of tricuspid stenosis. Aortic Valve: The aortic valve is tricuspid. Aortic valve regurgitation is not visualized. Mild aortic stenosis is present. Aortic  valve mean gradient measures 11.5 mmHg. Aortic valve peak gradient measures 20.6 mmHg. Aortic valve area, by VTI measures 2.51 cm. Pulmonic Valve: The pulmonic valve was not well visualized. Pulmonic valve regurgitation is not visualized. No evidence of pulmonic stenosis. Aorta: Aortic dilatation noted. There is mild dilatation of the ascending aorta, measuring 43 mm. Venous: The inferior vena cava is dilated in size with greater than 50% respiratory variability, suggesting right atrial pressure of 8 mmHg. IAS/Shunts: No atrial level shunt detected by color flow Doppler.  LEFT VENTRICLE PLAX 2D LVIDd:         5.00 cm     Diastology LVIDs:         3.50 cm     LV e' medial:    7.51 cm/s LV PW:         1.50 cm     LV E/e' medial:  13.6 LV IVS:        1.40 cm     LV e' lateral:   7.94 cm/s LVOT diam:     2.40 cm     LV E/e' lateral: 12.8 LV SV:         145 LV SV Index:   66 LVOT Area:     4.52 cm  LV Volumes (MOD) LV vol d, MOD A2C: 72.2 ml LV vol d, MOD A4C: 99.5 ml LV vol s, MOD A2C: 30.3 ml LV vol s, MOD A4C: 20.0 ml LV SV MOD A2C:     41.9 ml LV SV MOD A4C:     99.5 ml LV SV MOD BP:      59.4 ml RIGHT VENTRICLE             IVC RV S prime:     10.60 cm/s  IVC diam: 2.40 cm TAPSE (M-mode): 2.0 cm LEFT ATRIUM             Index       RIGHT ATRIUM           Index LA diam:        4.00 cm 1.81 cm/m  RA Area:     30.50 cm LA Vol (A2C):   64.1 ml 28.95 ml/m RA Volume:   121.00 ml 54.64 ml/m LA  Vol (A4C):   72.5 ml 32.74 ml/m LA Biplane Vol: 68.3 ml 30.84 ml/m  AORTIC VALVE AV Area (Vmax):    2.51 cm AV Area (Vmean):   2.45 cm AV Area (VTI):     2.51 cm AV Vmax:           227.00 cm/s AV Vmean:          161.500 cm/s AV VTI:            0.580 m AV Peak Grad:      20.6 mmHg AV Mean Grad:      11.5 mmHg LVOT Vmax:         126.00 cm/s LVOT Vmean:        87.600 cm/s LVOT VTI:          0.321 m LVOT/AV VTI ratio: 0.55  AORTA Ao Root diam: 3.70 cm Ao Asc diam:  4.03 cm MITRAL VALVE                TRICUSPID VALVE MV Area  (PHT): 4.89 cm     TR Peak grad:   31.4 mmHg MV Decel Time: 155 msec     TR Vmax:        280.00 cm/s MV E velocity: 102.00 cm/s MV A velocity: 63.00 cm/s   SHUNTS MV E/A ratio:  1.62         Systemic VTI:  0.32 m                             Systemic Diam: 2.40 cm [name] MD Electronically signed by [name] MD Signature Date/Time: 07/23/2020/1:17:38 PM    Final     (Echo, Carotid, EGD, Colonoscopy, ERCP)    Subjective:   Discharge Exam: Vitals:   07/25/20 0314 07/25/20 0840  BP: (!) 96/48 (!) 93/51  Pulse: (!) 55 60  Resp: 18 16  Temp: 99.1 F (37.3 C) 98 F (36.7 C)  SpO2:  94%     General: Pt is alert, awake, not in acute distress Cardiovascular: RRR, S1/S2 +, no rubs, no gallops Respiratory: CTA bilaterally, no wheezing, no rhonchi Abdominal: Soft, NT, ND, bowel sounds + Extremities: no edema, no cyanosis    The results of significant diagnostics from this hospitalization (including imaging, microbiology, ancillary and laboratory) are listed below for reference.     Microbiology: Recent Results (from the past 240 hour(s))  SARS CORONAVIRUS 2 (TAT 6-24 HRS) Nasopharyngeal Nasopharyngeal Swab     Status: None   Collection Time: 07/21/20  5:51 AM   Specimen: Nasopharyngeal Swab  Result Value Ref Range Status   SARS Coronavirus 2 NEGATIVE NEGATIVE Final    Comment: (NOTE) SARS-CoV-2 target nucleic acids are NOT DETECTED.  The SARS-CoV-2 RNA is generally detectable in upper and lower respiratory specimens during the acute phase of infection. Negative results do not preclude SARS-CoV-2 infection, do not rule out co-infections with other pathogens, and should not be used as the sole basis for treatment or other patient management decisions. Negative results must be combined with clinical observations, patient history, and epidemiological information. The expected result is Negative.  Fact Sheet for  Patients: SugarRoll.be  Fact Sheet for Healthcare Providers: https://www.woods-mathews.com/  This test is not yet approved or cleared by the [place] FDA and  has been authorized for detection and/or diagnosis of SARS-CoV-2 by FDA under an Emergency Use Authorization (EUA). This EUA will remain  in effect (meaning this test  can be used) for the duration of the COVID-19 declaration under Se ction 564(b)(1) of the Act, 21 U.S.C. section 360bbb-3(b)(1), unless the authorization is terminated or revoked sooner.  Performed at [facility], [place] [address] [place], [place] [address]   Culture, Urine     Status: Abnormal   Collection Time: 07/21/20  8:19 AM   Specimen: Urine, Random  Result Value Ref Range Status   Specimen Description URINE, RANDOM  Final   Special Requests   Final    NONE Performed at [facility], [place] [address], [place], [place] [address]    Culture MULTIPLE SPECIES PRESENT, SUGGEST RECOLLECTION (A)  Final   Report Status 07/22/2020 FINAL  Final     Labs: BNP (last 3 results) Recent Labs    07/21/20 0613  BNP 878.6*   Basic Metabolic Panel: Recent Labs  Lab 07/20/20 1511 07/21/20 0840 07/22/20 0249 07/23/20 0350 07/24/20 0254 07/25/20 0258  NA 136  --  139 136 138 137  K 3.9  --  3.6 2.8* 3.3* 4.3  CL 110  --  108 106 107 106  CO2 17*  --  18* 18* 22 19*  GLUCOSE 112*  --  115* 90 81 99  BUN 16  --  _0 CREATININE 2.12*  --  2.17* 2.12* 2.04* 2.24*  CALCIUM 9.2  --  9.5 8.5* 8.6* 8.7*  MG  --  2.1  --   --   --   --    Liver Function Tests: Recent Labs  Lab 07/21/20 0840  AST 97*  ALT 65*  ALKPHOS 95  BILITOT 0.9  PROT 6.1*  ALBUMIN 2.7*   No results for input(s): LIPASE, AMYLASE in the last 168 hours. No results for input(s): AMMONIA in the last 168 hours. CBC: Recent Labs  Lab 07/20/20 1511 07/21/20 0840 07/22/20 0249  WBC 7.1 7.7 6.2  HGB 8.0* 8.1* 8.8*  HCT  27.6* 27.9* 29.8*  MCV 89.6 90.3 90.3  PLT 223 225 220   Cardiac Enzymes: Recent Labs  Lab 07/21/20 0504  CKTOTAL 276   BNP: Invalid input(s): POCBNP CBG: Recent Labs  Lab 07/21/20 0403  GLUCAP 113*   D-Dimer No results for input(s): DDIMER in the last 72 hours. Hgb A1c No results for input(s): HGBA1C in the last 72 hours. Lipid Profile No results for input(s): CHOL, HDL, LDLCALC, TRIG, CHOLHDL, LDLDIRECT in the last 72 hours. Thyroid function studies No results for input(s): TSH, T4TOTAL, T3FREE, THYROIDAB in the last 72 hours.  Invalid input(s): FREET3 Anemia work up No results for input(s): VITAMINB12, FOLATE, FERRITIN, TIBC, IRON, RETICCTPCT in the last 72 hours. Urinalysis    Component Value Date/Time   COLORURINE YELLOW 07/20/2020 2021   APPEARANCEUR CLOUDY (A) 07/20/2020 2021   LABSPEC 1.020 07/20/2020 2021   PHURINE 5.0 07/20/2020 2021   GLUCOSEU NEGATIVE 07/20/2020 2021   GLUCOSEU NEGATIVE 04/22/2011 1541   HGBUR LARGE (A) 07/20/2020 2021   BILIRUBINUR NEGATIVE 07/20/2020 2021   KETONESUR NEGATIVE 07/20/2020 2021   PROTEINUR 100 (A) 07/20/2020 2021   UROBILINOGEN 0.2 04/22/2011 1541   NITRITE NEGATIVE 07/20/2020 2021   LEUKOCYTESUR NEGATIVE 07/20/2020 2021   Sepsis Labs Invalid input(s): PROCALCITONIN,  WBC,  LACTICIDVEN Microbiology Recent Results (from the past 240 hour(s))  SARS CORONAVIRUS 2 (TAT 6-24 HRS) Nasopharyngeal Nasopharyngeal Swab     Status: None   Collection Time: 07/21/20  5:51 AM   Specimen: Nasopharyngeal Swab  Result Value Ref  Range Status   SARS Coronavirus 2 NEGATIVE NEGATIVE Final    Comment: (NOTE) SARS-CoV-2 target nucleic acids are NOT DETECTED.  The SARS-CoV-2 RNA is generally detectable in upper and lower respiratory specimens during the acute phase of infection. Negative results do not preclude SARS-CoV-2 infection, do not rule out co-infections with other pathogens, and should not be used as the sole basis for  treatment or other patient management decisions. Negative results must be combined with clinical observations, patient history, and epidemiological information. The expected result is Negative.  Fact Sheet for Patients: SugarRoll.be  Fact Sheet for Healthcare Providers: https://www.woods-mathews.com/  This test is not yet approved or cleared by the [place] FDA and  has been authorized for detection and/or diagnosis of SARS-CoV-2 by FDA under an Emergency Use Authorization (EUA). This EUA will remain  in effect (meaning this test can be used) for the duration of the COVID-19 declaration under Se ction 564(b)(1) of the Act, 21 U.S.C. section 360bbb-3(b)(1), unless the authorization is terminated or revoked sooner.  Performed at [facility], [place] [address], [place], [place] [address]   Culture, Urine     Status: Abnormal   Collection Time: 07/21/20  8:19 AM   Specimen: Urine, Random  Result Value Ref Range Status   Specimen Description URINE, RANDOM  Final   Special Requests   Final    NONE Performed at [facility], [place] [address], [place], [place] [address]    Culture MULTIPLE SPECIES PRESENT, SUGGEST RECOLLECTION (A)  Final   Report Status 07/22/2020 FINAL  Final     Time coordinating discharge: 40 minutes  SIGNED:   Elmarie Shiley, MD  [facility]

## 2020-07-25 NOTE — Plan of Care (Signed)
  Problem: Acute Rehab PT Goals(only PT should resolve) Goal: Pt will Roll Supine to Side Outcome: Adequate for Discharge Goal: Pt Will Go Supine/Side To Sit Outcome: Adequate for Discharge Goal: Pt Will Go Sit To Supine/Side Outcome: Adequate for Discharge Goal: Patient Will Transfer Sit To/From Stand Outcome: Adequate for Discharge Goal: Pt Will Transfer Bed To Chair/Chair To Bed Outcome: Adequate for Discharge Goal: Pt Will Ambulate Outcome: Adequate for Discharge Goal: Pt Will Go Up/Down Stairs Outcome: Adequate for Discharge

## 2020-07-25 NOTE — Progress Notes (Signed)
Discharge instructions (including medications) discussed with and copy provided to patient/caregiver 

## 2020-07-25 NOTE — TOC Transition Note (Addendum)
Transition of Care [facility]) - CM/SW Discharge Note   Patient Details  Name: Evan Dorsey MRN: [id number] Date of Birth: [birth year]  [facility] CM/SW Contact:  Zenon Mayo, RN Phone Number: 07/25/2020, 10:36 AM   Clinical Narrative:    Patient is for dc today, NCM offered choice , the son chose [place], NCM made referral to [facility] with [facility].  Awaiting call back to see if he can take referral.  Son is ok with the 3 n 1 being supplied by [employer].  This will be brought up to patient room. Alvis Lemmings is able to add an aide also to services.  NCM informed MD to add HHAIDE.    Final next level of care: [place] Barriers to Discharge: No Barriers Identified   Patient Goals and CMS Choice Patient states their goals for this hospitalization and ongoing recovery are:: home with [facility] CMS Medicare.gov Compare Post Acute Care list provided to:: Patient Represenative (must comment) Choice offered to / list presented to : Adult Children  Discharge Placement                       Discharge Plan and Services                DME Arranged: 3-N-1 DME Agency: [employer] Date DME Agency Contacted: 07/25/20 Time DME Agency Contacted: [phone] Representative spoke with at DME Agency: [place]: [place]: [place] Date [place]: 07/25/20 Time [place]: [place] Representative spoke with at [place]: [place] ([place]) Interventions     Readmission Risk Interventions No flowsheet data found.

## 2020-07-25 NOTE — Discharge Instructions (Signed)
Information on my medicine - ELIQUIS (apixaban)  This medication education was reviewed with me or my healthcare representative as part of my discharge preparation.  You were taking this medication prior to this hospital admission. ATTENTION: The dose of your Eliquis has been reduced to 2.5 mg twice daily due to your age and renal (kidney) function.  Why was Eliquis prescribed for you? Eliquis was prescribed for you to reduce the risk of a blood clot forming that can cause a stroke if you have a medical condition called atrial fibrillation (a type of irregular heartbeat).  What do You need to know about Eliquis ? Take your Eliquis TWICE DAILY - one tablet in the morning and one tablet in the evening with or without food. If you have difficulty swallowing the tablet whole please discuss with your pharmacist how to take the medication safely.  Take Eliquis exactly as prescribed by your doctor and DO NOT stop taking Eliquis without talking to the doctor who prescribed the medication.  Stopping may increase your risk of developing a stroke.  Refill your prescription before you run out.  After discharge, you should have regular check-up appointments with your healthcare provider that is prescribing your Eliquis.  In the future your dose may need to be changed if your kidney function or weight changes by a significant amount or as you get older.  What do you do if you miss a dose? If you miss a dose, take it as soon as you remember on the same day and resume taking twice daily.  Do not take more than one dose of ELIQUIS at the same time to make up a missed dose.  Important Safety Information A possible side effect of Eliquis is bleeding. You should call your healthcare provider right away if you experience any of the following: ? Bleeding from an injury or your nose that does not stop. ? Unusual colored urine (red or dark brown) or unusual colored stools (red or black). ? Unusual bruising for  unknown reasons. ? A serious fall or if you hit your head (even if there is no bleeding).  Some medicines may interact with Eliquis and might increase your risk of bleeding or clotting while on Eliquis. To help avoid this, consult your healthcare provider or pharmacist prior to using any new prescription or non-prescription medications, including herbals, vitamins, non-steroidal anti-inflammatory drugs (NSAIDs) and supplements.  This website has more information on Eliquis (apixaban): http://www.eliquis.com/eliquis/home

## 2020-07-26 ENCOUNTER — Encounter (HOSPITAL_COMMUNITY): Payer: Self-pay | Admitting: Emergency Medicine

## 2020-07-26 ENCOUNTER — Ambulatory Visit: Payer: Medicare Other

## 2020-07-26 ENCOUNTER — Emergency Department (HOSPITAL_COMMUNITY)
Admission: EM | Admit: 2020-07-26 | Discharge: 2020-08-13 | Disposition: E | Payer: Medicare Other | Attending: Emergency Medicine | Admitting: Emergency Medicine

## 2020-07-26 ENCOUNTER — Ambulatory Visit: Payer: Medicare Other | Admitting: Cardiology

## 2020-07-26 DIAGNOSIS — Z96612 Presence of left artificial shoulder joint: Secondary | ICD-10-CM | POA: Insufficient documentation

## 2020-07-26 DIAGNOSIS — Z79899 Other long term (current) drug therapy: Secondary | ICD-10-CM | POA: Diagnosis not present

## 2020-07-26 DIAGNOSIS — I2510 Atherosclerotic heart disease of native coronary artery without angina pectoris: Secondary | ICD-10-CM | POA: Diagnosis not present

## 2020-07-26 DIAGNOSIS — N189 Chronic kidney disease, unspecified: Secondary | ICD-10-CM | POA: Diagnosis not present

## 2020-07-26 DIAGNOSIS — Z8546 Personal history of malignant neoplasm of prostate: Secondary | ICD-10-CM | POA: Diagnosis not present

## 2020-07-26 DIAGNOSIS — I252 Old myocardial infarction: Secondary | ICD-10-CM | POA: Insufficient documentation

## 2020-07-26 DIAGNOSIS — I469 Cardiac arrest, cause unspecified: Secondary | ICD-10-CM

## 2020-07-26 DIAGNOSIS — R600 Localized edema: Secondary | ICD-10-CM | POA: Diagnosis not present

## 2020-07-26 DIAGNOSIS — I509 Heart failure, unspecified: Secondary | ICD-10-CM | POA: Insufficient documentation

## 2020-07-26 DIAGNOSIS — Z8551 Personal history of malignant neoplasm of bladder: Secondary | ICD-10-CM | POA: Insufficient documentation

## 2020-07-26 DIAGNOSIS — I130 Hypertensive heart and chronic kidney disease with heart failure and stage 1 through stage 4 chronic kidney disease, or unspecified chronic kidney disease: Secondary | ICD-10-CM | POA: Diagnosis not present

## 2020-07-26 DIAGNOSIS — Z951 Presence of aortocoronary bypass graft: Secondary | ICD-10-CM | POA: Insufficient documentation

## 2020-07-26 DIAGNOSIS — Z7901 Long term (current) use of anticoagulants: Secondary | ICD-10-CM | POA: Diagnosis not present

## 2020-07-26 MED ORDER — SODIUM BICARBONATE 8.4 % IV SOLN
INTRAVENOUS | Status: AC | PRN
Start: 2020-07-26 — End: 2020-07-26
  Administered 2020-07-26: 50 meq via INTRAVENOUS

## 2020-07-26 MED ORDER — EPINEPHRINE 1 MG/10ML IJ SOSY
PREFILLED_SYRINGE | INTRAMUSCULAR | Status: AC | PRN
  Administered 2020-07-26 (×2): 1 via INTRAVENOUS

## 2020-07-26 MED ORDER — CALCIUM CHLORIDE 10 % IV SOLN
INTRAVENOUS | Status: AC | PRN
  Administered 2020-07-26: 1 g via INTRAVENOUS

## 2020-08-02 ENCOUNTER — Ambulatory Visit: Payer: Medicare Other

## 2020-08-05 ENCOUNTER — Ambulatory Visit: Payer: Medicare Other | Admitting: Physician Assistant

## 2020-08-13 NOTE — Code Documentation (Signed)
Patient time of death occurred at 83

## 2020-08-13 NOTE — ED Provider Notes (Signed)
[facility] EMERGENCY DEPARTMENT Provider Note   CSN: [id number] Arrival date & time: 2020/08/05  1133     History No chief complaint on file.   Evan Dorsey is a 87 y.o. male.  HPI Level 5 caveat due to cardiac arrest. Patient came in to the hospital in cardiac arrest.  Discharged yesterday after CHF exacerbation.  Had been at home apparently doing well except for some difficulty with walking.  Reportedly was weak all over.  Reportedly had been eating and drinking well.  More fatigued and less activity this morning.  EMS had been called for shortness of breath.  Had been hypoxic on EMS arrival down to the 80s.  Started on nasal cannula oxygen with improvement of the oxygenation, however went into PEA arrest.  Had epinephrine by EMS without return of vitals.  Lost pulses at around 1105.  CPR continued from then.  Upon arrival still no pulse.  Patient had [facility] airway in place.    Past Medical History:  Diagnosis Date  . Adenomatous polyp   . Arthritis    "left shoulder" (03/02/2013)  . Atrial fibrillation ([place])   . Bladder cancer ([place]) 07/31/2011  . CAD (coronary artery disease)    a. remote CABG b. prior cardiac cath 2005 c. Lexiscan Myoview 07/08/12 w/o evidence of ischemia, EF 53%  . Diverticulosis   . Elevated PSA   . Frequent urination    "day and night" (03/02/2013)  . GERD (gastroesophageal reflux disease)   . Gout    "been a long time ago" (03/02/2013)  . History of kidney stones   . HLD (hyperlipidemia)   . HTN (hypertension)   . Iron deficiency anemia due to chronic blood loss 06/29/2018  . Myocardial infarction ([place]) 1990's   silent  . Prostate cancer [facility])     Patient Active Problem List   Diagnosis Date Noted  . Acquired thrombophilia ([place]) 07/23/2020  . CHF (congestive heart failure) ([place]) 07/21/2020  . Acute kidney injury superimposed on chronic kidney disease ([place]) 07/21/2020  . Prolonged QT interval 07/21/2020  . Goals of care,  counseling/discussion 07/09/2018  . Iron deficiency anemia due to chronic blood loss 06/29/2018  . Malignant neoplasm of prostate ([place]) 04/22/2018  . Postural dizziness with presyncope 03/07/2018  . Rectal bleeding 03/07/2018  . Preop cardiovascular exam 02/10/2013  . Paroxysmal atrial fibrillation ([place]) 07/09/2012  . Hypokalemia 07/09/2012  . Precordial pain 07/07/2012  . Bladder cancer ([place]) 07/31/2011  . Hematuria 04/22/2011  . Atrial fibrillation ([place]) 08/29/2010  . HYPERCHOLESTEROLEMIA 02/25/2010  . Hyperlipidemia 02/25/2010  . HYPERTENSION, BENIGN 02/25/2010  . CORONARY ATHEROSCLEROSIS NATIVE CORONARY ARTERY 02/25/2010  . CAD, ARTERY BYPASS GRAFT 02/25/2010    Past Surgical History:  Procedure Laterality Date  . CARDIAC CATHETERIZATION     total occlusion to LAD, LCX, RCA. LIMA patent, normal seq SVG to OM, severe stenosis in SVG to PDA, but collaterals to PLA from OM. RCA went to PDA. treated medically   . CARDIOVASCULAR STRESS TEST  02/10/08   6 mets, poor tolerance, no chest pain   . CATARACT EXTRACTION W/ INTRAOCULAR LENS IMPLANT Right 2014  . CORONARY ARTERY BYPASS GRAFT  08/1991   "CABG X4" (03/02/2013)  . CYSTOSCOPY W/ STONE MANIPULATION    . ORIF ACETABULAR FRACTURE Left 04/2007   trimalleolar fracture  . TOTAL SHOULDER ARTHROPLASTY Left 03/02/2013  . TOTAL SHOULDER ARTHROPLASTY Left 03/02/2013   Procedure: LEFT TOTAL SHOULDER ARTHROPLASTY;  Surgeon: Marin Shutter, MD;  Location: [facility]  OR;  Service: Orthopedics;  Laterality: Left;  . TRANSURETHRAL RESECTION OF BLADDER TUMOR  07/31/2011   Procedure: TRANSURETHRAL RESECTION OF BLADDER TUMOR (TURBT);  Surgeon: Claybon Jabs, MD;  Location: [facility] ORS;  Service: Urology;  Laterality: N/A;  with [place]  . URETEROSCOPY WITH HOLMIUM LASER LITHOTRIPSY Right 11/06/2016   Procedure: URETEROSCOPY WITH HOLMIUM LASER LITHOTRIPSY/ RETROGRADE PYELOGRAM/ STENT;  Surgeon: Kathie Rhodes, MD;  Location: [facility];  Service:  Urology;  Laterality: Right;       Family History  Problem Relation Age of Onset  . CAD Father 49  . Prostate cancer Brother   . CAD Brother 10  . CAD Brother 59  . Breast cancer Neg Hx   . Colon cancer Neg Hx   . Pancreatic cancer Neg Hx     Social History   Tobacco Use  . Smoking status: Never Smoker  . Smokeless tobacco: Never Used  Vaping Use  . Vaping Use: Never used  Substance Use Topics  . Alcohol use: No  . Drug use: No    Home Medications Prior to Admission medications   Medication Sig Start Date End Date Taking? Authorizing Provider  acetaminophen (TYLENOL) 500 MG tablet Take 500 mg by mouth every 6 (six) hours as needed for moderate pain, headache or fever.    [provider]  amiodarone (PACERONE) 200 MG tablet TAKE ONE TABLET EVERY DAY Patient taking differently: Take 200 mg by mouth daily. 05/21/20   Lelon Perla, MD  apixaban (ELIQUIS) 2.5 MG TABS tablet Take 1 tablet (2.5 mg total) by mouth 2 (two) times daily. 07/25/20   Regalado, Belkys A, MD  calcium carbonate (TUMS - DOSED IN MG ELEMENTAL CALCIUM) 500 MG chewable tablet Chew 2 tablets by mouth daily.    [provider]  cholecalciferol (VITAMIN D) 1000 units tablet Take 1,000 Units by mouth daily.    [provider]  docusate sodium (COLACE) 100 MG capsule Take 1 capsule (100 mg total) by mouth daily. Do not take if you have loose stools Patient taking differently: Take 100 mg by mouth daily as needed for mild constipation. 03/31/19   Earlie Server, MD  furosemide (LASIX) 40 MG tablet Take 1 tablet (40 mg total) by mouth daily. 2020-08-24   Regalado, Belkys A, MD  gabapentin (NEURONTIN) 100 MG capsule Take 1 capsule (100 mg total) by mouth 3 (three) times daily. 07/25/20   Regalado, Belkys A, MD  Leuprolide Acetate 5 MG/ML KIT 0.2 ml    [provider]  lidocaine (LIDODERM) 5 % Place 1 patch onto the skin daily. Remove & Discard patch within 12 hours or as directed by MD  07/25/20   Regalado, Jerald Kief A, MD  nitroGLYCERIN (NITROSTAT) 0.4 MG SL tablet PLACE 1 TABLET UNDER THE TONGUE EVERY 5 MINUTES IF NEEDED FOR CHEST PAIN. MAX = 3 TABS/EPISODE. Patient taking differently: Place 0.4 mg under the tongue every 5 (five) minutes as needed for chest pain. 06/10/18   Josue Hector, MD  potassium chloride SA (KLOR-CON) 20 MEQ tablet Take 1 tablet (20 mEq total) by mouth daily. 07/25/20   Regalado, Belkys A, MD  psyllium (HYDROCIL/METAMUCIL) 95 % PACK Take 1 packet by mouth daily. 03/11/18   Hosie Poisson, MD  rosuvastatin (CRESTOR) 40 MG tablet Take 40 mg by mouth daily. 02/09/18   [provider]  traMADol (ULTRAM) 50 MG tablet Take 50 mg by mouth every 6 (six) hours as needed for moderate pain.    [provider]    Allergies    Patient has no allergy information on record.  Review of Systems   Review of Systems  Unable to perform ROS: Intubated    Physical Exam Updated Vital Signs Pulse (!) 0   Ht 5' 11" (1.803 m)   Wt 102 kg   BMI 31.36 kg/m   Physical Exam Vitals and nursing note reviewed.  Constitutional:      Appearance: He is not diaphoretic.  HENT:     Head: Atraumatic.     Mouth/Throat:     Comments: [facility] airway in place. Eyes:     Comments: Pupils equal but minimally reactive  Cardiovascular:     Comments: Pulse only with CPR. Pulmonary:     Comments: Equal breath sounds from bagging with [facility] airway. Abdominal:     Tenderness: There is no abdominal tenderness.  Musculoskeletal:     Cervical back: Neck supple.     Right lower leg: Edema present.     Left lower leg: Edema present.     Comments: Pitting edema bilateral lower extremities.  IO line in place left tibia.  Skin:    General: Skin is warm.  Neurological:     Comments: Unresponsive.  Few agonal breaths but no response to pain.     ED Results / Procedures / Treatments   Labs (all labs ordered are listed, but only abnormal results are displayed) Labs  Reviewed - No data to display  EKG None  Radiology No results found.  Procedures Procedures   Medications Ordered in ED Medications  EPINEPHrine (ADRENALIN) 1 MG/10ML injection (1 Syringe Intravenous Given 2020-08-01 1134)  calcium chloride injection (1 g Intravenous Given Aug 01, 2020 1135)  sodium bicarbonate injection (50 mEq Intravenous Given 01-Aug-2020 1135)    ED Course  I have reviewed the triage vital signs and the nursing notes.  Pertinent labs & imaging results that were available during my care of the patient were reviewed by me and considered in my medical decision making (see chart for details).    MDM Rules/Calculators/A&P                          Patient presented in cardiac arrest.  Had been complaining of shortness of breath and was mildly hypoxic before arrival.  Then lost vitals.  Had asystole for approximately 35 minutes without return of vitals.  Epinephrine and then calcium and bicarb given.  Had mildly increased creatinine with recent diuresis for CHF.  Recent admission the hospital but pulmonary was not felt less likely particularly with patient being on anticoagulation.  Bedside ultrasound done and showed no cardiac activity after continued CPR.  Time of death 89.  Discussed with medical examiner and states not a ME case.  PCP should be able to sign does not typically.  Discussed with his family and notified of the death.  Cardiopulmonary Resuscitation (CPR) Procedure Note Directed/Performed by: Davonna Belling I personally directed ancillary staff and/or performed CPR in an effort to regain return of spontaneous circulation and to maintain cardiac, neuro and systemic perfusion.    Final Clinical Impression(s) / ED Diagnoses Final diagnoses:  Cardiac arrest [facility])    Rx / DC Orders ED Discharge Orders    None       Davonna Belling, MD 01-Aug-2020 1243

## 2020-08-13 NOTE — ED Triage Notes (Signed)
Pt from home via [facility], called for shob and was hypoxic 86%. In route became pulseless and EMS started CPR. Arrives CPR in progress. Dc yesterday from hospital for CHF

## 2020-08-13 NOTE — Code Documentation (Signed)
Pulse check, pulses absent

## 2020-08-13 NOTE — Progress Notes (Signed)
Responded to ED p[age to support family. Patient died. Supported family and staff.  Jaclynn Major, [place], [facility], Pager [phone]

## 2020-08-13 DEATH — deceased

## 2020-09-06 ENCOUNTER — Other Ambulatory Visit: Payer: Medicare Other

## 2020-09-09 ENCOUNTER — Ambulatory Visit: Payer: Medicare Other | Admitting: Oncology

## 2020-09-09 ENCOUNTER — Ambulatory Visit: Payer: Medicare Other

## 2020-10-03 ENCOUNTER — Ambulatory Visit: Payer: Medicare Other | Admitting: Cardiology
